# Patient Record
Sex: Female | Born: 1935
Health system: Southern US, Community
[De-identification: ages and names within clinical notes are randomized; demographics above are authoritative.]

## PROBLEM LIST (undated history)

## (undated) DIAGNOSIS — F32A Depression, unspecified: Secondary | ICD-10-CM

## (undated) DIAGNOSIS — I10 Essential (primary) hypertension: Secondary | ICD-10-CM

## (undated) DIAGNOSIS — K219 Gastro-esophageal reflux disease without esophagitis: Secondary | ICD-10-CM

## (undated) DIAGNOSIS — T8859XA Other complications of anesthesia, initial encounter: Secondary | ICD-10-CM

## (undated) DIAGNOSIS — M1990 Unspecified osteoarthritis, unspecified site: Secondary | ICD-10-CM

## (undated) DIAGNOSIS — N189 Chronic kidney disease, unspecified: Secondary | ICD-10-CM

## (undated) DIAGNOSIS — C189 Malignant neoplasm of colon, unspecified: Secondary | ICD-10-CM

## (undated) DIAGNOSIS — Z5189 Encounter for other specified aftercare: Secondary | ICD-10-CM

## (undated) DIAGNOSIS — E785 Hyperlipidemia, unspecified: Secondary | ICD-10-CM

## (undated) DIAGNOSIS — R011 Cardiac murmur, unspecified: Secondary | ICD-10-CM

## (undated) DIAGNOSIS — F419 Anxiety disorder, unspecified: Secondary | ICD-10-CM

## (undated) DIAGNOSIS — T4145XA Adverse effect of unspecified anesthetic, initial encounter: Secondary | ICD-10-CM

## (undated) DIAGNOSIS — Z923 Personal history of irradiation: Secondary | ICD-10-CM

## (undated) DIAGNOSIS — C801 Malignant (primary) neoplasm, unspecified: Secondary | ICD-10-CM

## (undated) DIAGNOSIS — F329 Major depressive disorder, single episode, unspecified: Secondary | ICD-10-CM

## (undated) DIAGNOSIS — I2510 Atherosclerotic heart disease of native coronary artery without angina pectoris: Secondary | ICD-10-CM

## (undated) HISTORY — DX: Gastro-esophageal reflux disease without esophagitis: K21.9

## (undated) HISTORY — PX: BREAST LUMPECTOMY: SHX2

## (undated) HISTORY — DX: Encounter for other specified aftercare: Z51.89

## (undated) HISTORY — PX: EYE SURGERY: SHX253

## (undated) HISTORY — PX: OTHER SURGICAL HISTORY: SHX169

## (undated) HISTORY — PX: ABDOMINAL HYSTERECTOMY: SHX81

## (undated) HISTORY — DX: Other complications of anesthesia, initial encounter: T88.59XA

## (undated) HISTORY — PX: DIAGNOSTIC LAPAROSCOPY: SUR761

## (undated) HISTORY — DX: Malignant (primary) neoplasm, unspecified: C80.1

## (undated) HISTORY — DX: Chronic kidney disease, unspecified: N18.9

## (undated) HISTORY — DX: Essential (primary) hypertension: I10

## (undated) HISTORY — DX: Cardiac murmur, unspecified: R01.1

## (undated) HISTORY — PX: APPENDECTOMY: SHX54

## (undated) HISTORY — DX: Hyperlipidemia, unspecified: E78.5

## (undated) HISTORY — DX: Adverse effect of unspecified anesthetic, initial encounter: T41.45XA

---

## 1959-07-11 DIAGNOSIS — Z5189 Encounter for other specified aftercare: Secondary | ICD-10-CM

## 1959-07-11 HISTORY — DX: Encounter for other specified aftercare: Z51.89

## 1959-07-11 HISTORY — PX: OTHER SURGICAL HISTORY: SHX169

## 1998-07-02 ENCOUNTER — Other Ambulatory Visit: Admission: RE | Admit: 1998-07-02 | Discharge: 1998-07-02 | Payer: Self-pay | Admitting: Gynecology

## 1999-11-10 HISTORY — PX: BREAST SURGERY: SHX581

## 2000-03-12 ENCOUNTER — Encounter: Payer: Self-pay | Admitting: Geriatric Medicine

## 2000-03-12 ENCOUNTER — Encounter: Admission: RE | Admit: 2000-03-12 | Discharge: 2000-03-12 | Payer: Self-pay | Admitting: Geriatric Medicine

## 2000-07-16 ENCOUNTER — Encounter: Payer: Self-pay | Admitting: Geriatric Medicine

## 2000-07-16 ENCOUNTER — Encounter: Admission: RE | Admit: 2000-07-16 | Discharge: 2000-07-16 | Payer: Self-pay | Admitting: Geriatric Medicine

## 2001-07-01 ENCOUNTER — Encounter: Admission: RE | Admit: 2001-07-01 | Discharge: 2001-07-01 | Payer: Self-pay | Admitting: Geriatric Medicine

## 2001-07-01 ENCOUNTER — Encounter: Payer: Self-pay | Admitting: Geriatric Medicine

## 2001-07-08 ENCOUNTER — Ambulatory Visit (HOSPITAL_COMMUNITY): Admission: RE | Admit: 2001-07-08 | Discharge: 2001-07-08 | Payer: Self-pay | Admitting: Geriatric Medicine

## 2001-09-29 ENCOUNTER — Ambulatory Visit (HOSPITAL_COMMUNITY): Admission: RE | Admit: 2001-09-29 | Discharge: 2001-09-29 | Payer: Self-pay | Admitting: Gastroenterology

## 2001-10-27 ENCOUNTER — Encounter: Admission: RE | Admit: 2001-10-27 | Discharge: 2001-10-27 | Payer: Self-pay | Admitting: *Deleted

## 2001-10-27 ENCOUNTER — Encounter: Payer: Self-pay | Admitting: *Deleted

## 2001-10-28 ENCOUNTER — Encounter (INDEPENDENT_AMBULATORY_CARE_PROVIDER_SITE_OTHER): Payer: Self-pay | Admitting: *Deleted

## 2001-10-28 ENCOUNTER — Encounter: Payer: Self-pay | Admitting: *Deleted

## 2001-10-28 ENCOUNTER — Ambulatory Visit (HOSPITAL_BASED_OUTPATIENT_CLINIC_OR_DEPARTMENT_OTHER): Admission: RE | Admit: 2001-10-28 | Discharge: 2001-10-28 | Payer: Self-pay | Admitting: *Deleted

## 2001-11-07 ENCOUNTER — Ambulatory Visit: Admission: RE | Admit: 2001-11-07 | Discharge: 2002-02-05 | Payer: Self-pay | Admitting: Radiation Oncology

## 2001-11-09 HISTORY — PX: CORONARY ARTERY BYPASS GRAFT: SHX141

## 2001-11-11 ENCOUNTER — Encounter (INDEPENDENT_AMBULATORY_CARE_PROVIDER_SITE_OTHER): Payer: Self-pay | Admitting: Specialist

## 2001-11-11 ENCOUNTER — Ambulatory Visit (HOSPITAL_BASED_OUTPATIENT_CLINIC_OR_DEPARTMENT_OTHER): Admission: RE | Admit: 2001-11-11 | Discharge: 2001-11-11 | Payer: Self-pay | Admitting: *Deleted

## 2001-11-18 ENCOUNTER — Encounter: Payer: Self-pay | Admitting: Geriatric Medicine

## 2001-11-18 ENCOUNTER — Encounter: Admission: RE | Admit: 2001-11-18 | Discharge: 2001-11-18 | Payer: Self-pay | Admitting: Geriatric Medicine

## 2002-03-03 ENCOUNTER — Other Ambulatory Visit: Admission: RE | Admit: 2002-03-03 | Discharge: 2002-03-03 | Payer: Self-pay | Admitting: *Deleted

## 2002-05-01 ENCOUNTER — Other Ambulatory Visit: Admission: RE | Admit: 2002-05-01 | Discharge: 2002-05-01 | Payer: Self-pay | Admitting: Obstetrics and Gynecology

## 2002-05-03 ENCOUNTER — Encounter: Payer: Self-pay | Admitting: Geriatric Medicine

## 2002-05-03 ENCOUNTER — Encounter: Admission: RE | Admit: 2002-05-03 | Discharge: 2002-05-03 | Payer: Self-pay | Admitting: Geriatric Medicine

## 2002-05-10 ENCOUNTER — Encounter: Payer: Self-pay | Admitting: Obstetrics and Gynecology

## 2002-05-10 ENCOUNTER — Encounter: Admission: RE | Admit: 2002-05-10 | Discharge: 2002-05-10 | Payer: Self-pay | Admitting: Obstetrics and Gynecology

## 2002-06-12 ENCOUNTER — Ambulatory Visit (HOSPITAL_COMMUNITY): Admission: RE | Admit: 2002-06-12 | Discharge: 2002-06-12 | Payer: Self-pay | Admitting: Radiation Oncology

## 2002-06-21 ENCOUNTER — Ambulatory Visit (HOSPITAL_COMMUNITY): Admission: RE | Admit: 2002-06-21 | Discharge: 2002-06-21 | Payer: Self-pay | Admitting: Geriatric Medicine

## 2002-09-28 ENCOUNTER — Ambulatory Visit (HOSPITAL_COMMUNITY): Admission: RE | Admit: 2002-09-28 | Discharge: 2002-09-28 | Payer: Self-pay | Admitting: Interventional Cardiology

## 2002-10-12 ENCOUNTER — Encounter: Payer: Self-pay | Admitting: Surgery

## 2002-10-16 ENCOUNTER — Encounter: Payer: Self-pay | Admitting: Surgery

## 2002-10-16 ENCOUNTER — Inpatient Hospital Stay (HOSPITAL_COMMUNITY): Admission: RE | Admit: 2002-10-16 | Discharge: 2002-10-21 | Payer: Self-pay | Admitting: Surgery

## 2002-10-16 ENCOUNTER — Encounter (INDEPENDENT_AMBULATORY_CARE_PROVIDER_SITE_OTHER): Payer: Self-pay | Admitting: Specialist

## 2002-10-17 ENCOUNTER — Encounter: Payer: Self-pay | Admitting: Surgery

## 2002-10-18 ENCOUNTER — Encounter: Payer: Self-pay | Admitting: Surgery

## 2002-10-19 ENCOUNTER — Encounter: Payer: Self-pay | Admitting: Thoracic Surgery (Cardiothoracic Vascular Surgery)

## 2002-11-14 ENCOUNTER — Ambulatory Visit: Admission: RE | Admit: 2002-11-14 | Discharge: 2002-11-14 | Payer: Self-pay | Admitting: Radiation Oncology

## 2002-11-21 ENCOUNTER — Ambulatory Visit: Admission: RE | Admit: 2002-11-21 | Discharge: 2002-11-21 | Payer: Self-pay | Admitting: Radiation Oncology

## 2003-01-15 ENCOUNTER — Encounter (HOSPITAL_COMMUNITY): Admission: RE | Admit: 2003-01-15 | Discharge: 2003-04-15 | Payer: Self-pay | Admitting: Interventional Cardiology

## 2003-05-03 ENCOUNTER — Ambulatory Visit (HOSPITAL_COMMUNITY): Admission: RE | Admit: 2003-05-03 | Discharge: 2003-05-03 | Payer: Self-pay | Admitting: Geriatric Medicine

## 2003-05-03 ENCOUNTER — Encounter: Payer: Self-pay | Admitting: Geriatric Medicine

## 2003-05-04 ENCOUNTER — Ambulatory Visit (HOSPITAL_COMMUNITY): Admission: RE | Admit: 2003-05-04 | Discharge: 2003-05-04 | Payer: Self-pay | Admitting: Geriatric Medicine

## 2003-05-04 ENCOUNTER — Encounter: Payer: Self-pay | Admitting: Geriatric Medicine

## 2004-05-07 ENCOUNTER — Encounter: Admission: RE | Admit: 2004-05-07 | Discharge: 2004-05-07 | Payer: Self-pay | Admitting: General Surgery

## 2006-04-13 ENCOUNTER — Encounter: Admission: RE | Admit: 2006-04-13 | Discharge: 2006-04-13 | Payer: Self-pay | Admitting: Geriatric Medicine

## 2008-06-08 ENCOUNTER — Encounter: Admission: RE | Admit: 2008-06-08 | Discharge: 2008-06-08 | Payer: Self-pay | Admitting: Geriatric Medicine

## 2009-02-13 ENCOUNTER — Other Ambulatory Visit: Admission: RE | Admit: 2009-02-13 | Discharge: 2009-02-13 | Payer: Self-pay | Admitting: Obstetrics and Gynecology

## 2011-03-27 NOTE — Consult Note (Signed)
NAME:  Marie Burch, Marie Burch                           ACCOUNT NO.:  [id number]   MEDICAL RECORD NO.:  [id number]                   PATIENT TYPE:  INP   LOCATION:  2301                                 FACILITY:  [facility]   PHYSICIAN:  Quita Skye. Krista Blue, M.D.               DATE OF BIRTH:  1934-08-21   DATE OF CONSULTATION:  DATE OF DISCHARGE:                                   CONSULTATION   SURGEON:  Quita Skye. Krista Blue, M.D.   DIAGNOSIS:  Mitral regurgitation and coronary artery disease.   HISTORY OF PRESENT ILLNESS:  The patient is a 77 year-old white female who  presents to the operating room for coronary artery bypass grafting and  mitral valve replacement versus repair.  A transesophageal echocardiogram  was requested for the intraoperative management of this patient.  The  patient underwent a routine cardiac induction.  The transesophageal probe  was lubricated and carefully inserted through a mouth guard.  Cardiac images  were obtained and overall images of the heart appeared normal without  pericardial effusion. The right atrium was normal in size without mass or  thrombus.  The inner-atrial septum appeared to be intact without evidence of  defect. The tricuspid valve had trace regurgitation but overall structure  appeared to be normal.  The right ventricle was generous but had good  contractility overall.  The left atrium had moderate enlargement.  The  mitral valve was clearly abnormal with the posterior leaflet demonstrating a  prolapsing flail dissection with a regurgitant jet tracking along the septal  wall.  The regurgitation was estimated at 4+.  There was some thickening of  the valve as well. The left ventricle had good overall contractility and  appeared to be normal in size.  The aortic valve had three leaflets.  There  was no evidence of regurgitation or stenosis.  Following coronary artery  bypass grafting the mitral valve was repaired and a valve ring was inserted  into the  patient.  The valve was then assessed before coming off bypass and  appeared to have no more prolapse of the valve with good coaptation, so the  patient was separated then from bypass.  Images post bypass showed the valve  to be functioning with trace regurgitation and no prolapse at all. The valve  ring appeared to be seated in place with no evidence of perivalvular leak.  Overall, this was significant improvement. The left ventricle appeared to  function without any evidence of regional wall motion abnormalities and the  patient continued to do well. The probe was carefully removed and the  patient was taken to the SICU in good condition.  Quita Skye Krista Blue, M.D.    JDS/MEDQ  D:  10/16/2002  T:  10/16/2002  Job:  [id number]

## 2011-03-27 NOTE — Op Note (Signed)
NAME:  Marie Burch, Marie Burch                           ACCOUNT NO.:  [id number]   MEDICAL RECORD NO.:  [id number]                   PATIENT TYPE:  INP   LOCATION:  2301                                 FACILITY:  [facility]   PHYSICIAN:  Evelene Croon, M.D.                  DATE OF BIRTH:  1934/10/13   DATE OF PROCEDURE:  10/16/2002  DATE OF DISCHARGE:                                 OPERATIVE REPORT   PREOPERATIVE DIAGNOSES:  Severe mitral regurgitation, two-vessel coronary  artery disease.   POSTOPERATIVE DIAGNOSES:  Severe mitral regurgitation, two-vessel coronary  artery disease.   OPERATIVE PROCEDURE:  Median sternotomy, extracorporal circulation, coronary  artery bypass graft surgery times two, using saphenous vein grafts to the  diagonal branch of the left anterior descending and the obtuse marginal  branch of the left circumflex coronary artery; mitral valve repair with  quadrangular resection of flail segment of the posterior leaflet, with  insertion of a 28 millimeter St. Jude Sequin annuloplasty ring; oversewing  of the left atrial appendage; and, endoscopic vein harvesting from the right  thigh.   SURGEON:  Dr. Evelene Croon.   ASSISTANT:  Dr. Kathlee Nations Trigt.   SECOND ASSISTANT:  Adair Patter, PA-C.   ANESTHESIA:  General endotracheal.   CLINICAL HISTORY:  This patient is a 77 year old white female, with history  of mitral valve disease, that is longstanding.  She has had a mitral  regurgitation murmur noted since at least 2001.  She has been followed by  echocardiograms since.  She was diagnosed with breast cancer and underwent  lumpectomy and radiation therapy that finished in March 2003, and then  beginning about April 2003, she began having shortness of breath.  An  echocardiogram at that time was apparently stable.  She saw multiple doctors  and was given different diagnoses, such as radiation pneumonitis and asthma,  and was eventually referred to Cardiology, where she  was found to be in  congestive heart failure.  She had a dramatic improvement with diuretics.  An echocardiogram on August 25, 2002, showed severe mitral regurgitation.  There was no significant tricuspid trauma, aorta trauma or pulmonic  insufficiency.  The left atrium was remarkably increased in size and the  left ventricle appeared slightly larger.  The left ventricular ejection  fraction was about 70%.  Right and left heart catheterization was performed  on August 28, 2002.  This showed a pulmonary artery pressure of 61/29, with  a wedge pressure of 26 and a Victor wave of 45.  The left ventricle was  mildly dilated with an ejection fraction of 55-60%.  There was four plus  mitral regurgitation in the pulmonary veins.  Coronary angiography showed a  75% first diagonal ostial stenosis and a 70-80% obtuse marginal stenosis.  After review of the patient and her studies, it was felt that mitral valve  repair or replacement, and coronary  artery bypass graft surgery was probably  the best treatment.  I discussed the operative procedure of mitral valve  repair and possibly replacement, and coronary artery bypass graft surgery  with the patient and her family, including alternatives, benefits, and  risks, including bleeding, blood transfusion, infection, stroke, myocardial  infarction and death.  They understood and agreed to proceed.  We discussed  the options for prosthetic valve replacement if needed, including mechanical  and tissue valves, and she decided that a mechanical valve would be best for  her.   OPERATIVE PROCEDURE:  The patient was taken to the table in the supine  position.  After induction of general endotracheal anesthesia a Foley  catheter was placed in the bladder using sterile technique.  Then, the  chest, abdomen and both lower extremities were prepped and draped in the  usual sterile manner.  Preoperative intravenous vancomycin and Tequin were  given.   A  transesophageal echocardiogram was performed by Anesthesiology.  This  showed severe mitral regurgitation.  There appeared to be a flail segment on  the posterior leaflet, with a ruptured chordae.  The anterior leaflet  appeared structurally in good condition.  There was marked left atrial  dilatation.  Left ventricular function was mildly depressed, with marked  left ventricular dilatation.  Pulmonary pressure was 80/40 before the  patient was put to sleep and then decreased to about 60/35.   Then, the chest was entered through a median sternotomy incision and the  pericardium opened in the  midline.  Examination of the heart showed some  dilatation of the right and left ventricles.  The ascending aorta had no  palpable plaques in it.   Then, the patient was heparinized and when an adequate act was achieved the  distal ascending aorta was cannulated using a 20 French aortic cannula for  arterial inflow.  Venous outflow was achieved using bicaval venous  cannulation with a 28 French metal-tipped, right-angled cannula placed with  a pursestring suture in the superior vena cava and a 36 [place] plastic,  right-angled cannula placed with a pursestring suture in the lower atrium.  An antegrade cardioplegia and vancomycin cannula was inserted in the aortic  root.  A retrograde cardioplegia cannula was inserted in the coronary sinus.  Unfortunately, this resulted in perforation of the midportion of the  coronary sinus.  The tissues were very weak.   At the same time a segment of the greater saphenous vein was harvested from  the right thigh using endoscopic vein harvest technique.  This vein was of  medium size and good quality.   Then, the patient was placed on cardiopulmonary bypass and the distal  coronary was identified.  The diagonal and obtuse marginal were both medium-  sized graftable vessels.  There was no other significant coronary disease  noted.  The aorta was then cross-clamped  and 500 cubic centimeters of cold blood  antegrade cardioplegia was administered in the aortic root, with quick  arrest of the heart.  Systemic hypothermia to 20 degrees Centigrade and  topical hypothermic iced saline was used.  A temperature probe was placed in  the septum and insulating pad in the pericardium.   The, the coronary sinus was repaired using continuous 7-0 Prolene suture.  The coronary sinus catheter was then removed.  Then, the first distal  anastomosis was performed to the obtuse marginal vessel.  The internal  diameter of this vessel was about 1.6 millimeters.  The conduit we used with  a segment of greater saphenous vein and anastomosis performed in an end-to-  side manner using continuous 7-0 Prolene  suture.  Flow was administered  through the graft and was excellent.   The second distal anastomosis was performed to the diagonal branch.  The  internal diameter of this vessel was about 1.6 millimeters.  The conduit we  used was a second saphenous vein and anastomosis was performed in an end-to-  side manner using continuous 7-0 Prolene suture.  Flow was administered  through the graft and was excellent.  Then, another dose of cardioplegia was  given down the vein grafts and the aortic root.   Then, attention was turned to the mitral valve repair.  The left atrium was  opened through a vertical incision in the intra-atrial groove.  A retractor  was placed.  Examination of the valve showed that there were several primary  chordae to the P2 section of the posterior leaflet that were ruptured.  The  remainder of the posterior leaflet appeared intact.  The anterior leaflet is  mildly thickened, but structurally intact.  There is no prolapse.  There was  some annular calcification.  The subvalvular apparatus, otherwise, appeared  unremarkable.  I thought this would be a repairable valve.  Then, the P2  section of the posterior leaflet was resected in a quadrangular  fashion.  Then, part of the posterior leaflet was divided from the annulus and a  sliding leaflet plasty was performed using continuous 4-0 Ethibond suture.  Then, the area of the leaflet resection was buttressed with two annular  sutures of 2-0 Ethibond.  The posterior leaflet was then reapproximated  using interrupted 5-0 Prolene sutures.  The leaflet was flushed with saline  and it appeared to be a competent valve.  Then, the annulus was sized and a  28 millimeter St. Jude Seguin annuloplasty ring was chosen.  A series of 2-0  Ethibond, horizontal and mattress sutures were placed around the annulus.  It was placed through the sewing ring and the ring lowered into place.  The  sutures were tied sequentially.  The annuloplasty ring seated nicely.  The  valve was then tested with ice saline and was completely competent.  There  was good coarctation of the leaflets.  Then, the patient was rewarmed to 37 degrees Centigrade.  The left atrium was closed in two layers using  continuous 4-0 Prolene suture.  Then, the patient was placed with the head  in the Trendelenburg position.  After deairing maneuvers the cross-clamp was  removed, with time of 152 minutes.  There was spontaneous return of sinus  rhythm.   Partial occlusion clamps were placed in the aortic root and the two proximal  vein graft anastomoses were performed in an end-to-side manner using  continuous 6-0 Prolene suture.  The clamp was removed, the vein graft  deaired and clamps removed from them.  The proximal and distal anastomosis  appeared hemostatic and all the grafts are satisfactory.  Graft markers were  placed on the proximal anastomoses.  Two temporary right ventricular and  right atrial pacing wires placed and brought through the skin.   When the patient had rewarmed to 37 degrees Centigrade, she was weaned from  cardiopulmonary bypass on low dose dopamine.  Total bypass time was 192  minutes.  Cardiac function  appeared excellent with cardiac output of 5  liters per minute.  Transesophageal echocardiogram was performed and the  mitral valve showed no leakage.  There was  a marked decrease in  postoperative pulmonary artery pressure.  Left ventricular function appeared  improved.  Then, the patient was given protamine and the venous and aortic  cannulas were removed without difficulty.  The patient was given platelets  due to thrombocytopenia with a platelet count of 72,000.  Hemostasis was  achieved.  Three chest tubes were placed, in the posterior pericardium, one  in the anterior mediastinum and one in the right pleural space which had  been opened during sternotomy.  The pericardium was then reapproximated over  the heart.  The sternum was closed with number 16 sternal wire.  The fascia  was closed with continuous #1 Vicryl running suture.  The subcutaneous  tissue was closed with continuous 2-0 Vicryl and the skin with 3-0 Vicryl  subcuticular closure.  The lower extremity vein harvest site was closed in  running sutures in a similar manner.  The sponge, needle and instrument  counts were correct according to the scrub nurse.  Dry sterile dressings  were applied over the incisions, around the chest tubes,  with Pleur-evac  suction.  The patient remained hemodynamically stable and was transported to  the Surgical Intensive Care Unit in guarded, but stable condition.                                               Evelene Croon, M.D.    BB/MEDQ  D:  10/16/2002  T:  10/16/2002  Job:  [id number]   cc:   Lyn Records III, M.D.  [address]  [address]  [place]  [place] [address]  Fax: [phone]   Cardiac Cath Lab

## 2011-03-27 NOTE — Op Note (Signed)
[place]  Patient:    Marie Burch, Marie Burch Visit Number: [id number] MRN: [id number]          Service Type: DSU Location: [facility] Attending Physician:  Kandis Mannan Dictated by:   Donnie Coffin Samuella Cota, M.D. Proc. Date: 10/28/01 Admit Date:  10/28/2001   CC:         Hal T. Stoneking, M.D.   Operative Report  CCS NUMBER:  [id number]  PREOPERATIVE DIAGNOSIS:  Abnormal mammogram, right breast.  POSTOPERATIVE DIAGNOSIS:  Abnormal mammogram, right breast.  OPERATION:  Needle localization and biopsy of right breast.  SURGEON:  Maisie Fus B. Samuella Cota, M.D.  ANESTHESIA:  One percent Xylocaine local with anesthesia monitoring.  ANESTHESIOLOGIST:  Janetta Hora. Gelene Mink, M.D. and CRNA.  DESCRIPTION OF PROCEDURE:  The patient was taken to the operating room and placed on the table in the supine position.  The patient had a wire coming through the skin of the right breast in the upper outer quadrant which was projecting down toward her umbilicus.  The right breast was prepped and draped in a sterile field.  An elliptical incision was outlined with the skin marker. One percent Xylocaine local was used to the infiltrate the skin and underlying breast tissue.  The elliptical incision was made so as to remove the opening in the skin made by the wire.  Using the wire as a guide, a rather generous core biopsy around the wire was taken out.  No abnormality of the breast tissue was noted grossly.  The specimen was marked medially with the short suture, and laterally and inferiorly with the long suture.  The specimen was given to the radiologist who x-rayed the specimen and said the area of concern had been removed.  Bleeding was controlled with the cautery.  The subcutaneous tissue closed with interrupted sutures of 3-0 Vicryl and the skin was closed with running subcuticular suture of 4-0 Vicryl.  Benzoin and 1/2 inch Steri-Strips were used to reinforce the skin closure.  The  patient had a small flesh-colored mole just inferior to this and this was excised.  The base was cauterized.  A dry sterile dressing using 4 x 4s, ABD, and 4 inch Hypafix was applied.  The patient seemed to tolerate the procedure well and was taken to the PACU in satisfactory condition. Dictated by:   Donnie Coffin Samuella Cota, M.D. Attending Physician:  Kandis Mannan DD:  10/28/01 TD:  10/29/01 Job: [id number]

## 2011-03-27 NOTE — Discharge Summary (Signed)
   NAME:  Marie Burch, Marie Burch                           ACCOUNT NO.:  [id number]   MEDICAL RECORD NO.:  [id number]                   PATIENT TYPE:  INP   LOCATION:  2011                                 FACILITY:  [facility]   PHYSICIAN:  Evelene Croon, M.D.                  DATE OF BIRTH:  January 14, 1934   DATE OF ADMISSION:  10/16/2002  DATE OF DISCHARGE:  10/21/2002                                 DISCHARGE SUMMARY   ADMISSION DIAGNOSES:  1. Coronary artery disease.  2. Mitral regurgitation.   HOSPITAL COURSE:  The patient was admitted by this hospital on October 16, 2002, after being seen and evaluated by Dr. Laneta Simmers as an outpatient for  coronary artery disease and mitral regurgitation.  On the day of admission,  the patient underwent a coronary bypass graft x2 with a saphenous vein graft  to the diagonal and a saphenous vein graft to the obtuse marginal artery.  She also underwent a mitral valve repair with a  #28 size ___________.  No  complications were noted during the procedure. Postoperatively, patient had  Coumadin started secondary to her mitral valve repair. She had some  thrombocytopenia postoperatively. This was alleviated by discontinuing all  heparin flushes.  Her hemoglobin, hematocrit stabilized at 8.9, 26.3, and  her platelets had risen to 120,000 by time of discharge.  She was  subsequently deemed stable for discharge home on October 21, 2002 after a  relatively unremarkable hospital course.   DISCHARGE MEDICATIONS:  1. Aspirin 325 mg 1 daily.  2. Protonix 40 mg twice daily.  3. Lasix 40 mg one daily for 7 days.  4. Potassium chloride 20 mEq 1 daily for 7 days.  5. Toprol XL 25 mg one daily.  6. Ultram 50 mg, ______ tablet every 4 to 6 hours as needed for relieve     pain.  7. Coumadin 5 mg one daily or as directed by her cardiologist, Dr. Katrinka Blazing.   DISCHARGE ACTIVITIES:  She was told no driving or strenuous activity.   DISCHARGE DIET:  Low fat, low salt.   WOUND CARE:   Patient told could shower and clean incision with soap and  water.   FOLLOW UP:  Patient told to see her cardiologist Dr. Katrinka Blazing in two weeks.  She was also told to see Dr. Laneta Simmers on Tuesday, January 6 at 9:30 A.M.     Levin Erp. Steward, P.A.                      Evelene Croon, M.D.    BGS/MEDQ  D:  10/20/2002  T:  10/22/2002  Job:  [id number]

## 2011-03-27 NOTE — Cardiovascular Report (Signed)
NAME:  Marie Burch, Marie Burch                           ACCOUNT NO.:  [id number]   MEDICAL RECORD NO.:  [id number]                   PATIENT TYPE:  OIB   LOCATION:  2899                                 FACILITY:  [facility]   PHYSICIAN:  Lesleigh Noe, M.D.            DATE OF BIRTH:  August 10, 1934   DATE OF PROCEDURE:  09/28/2002  DATE OF DISCHARGE:                              CARDIAC CATHETERIZATION   INDICATIONS FOR PROCEDURE:  Recent heart failure, progressive and severe  mitral regurgitation.   PROCEDURE:  1. Left heart catheterization.  2. Right heart catheterization.  3. Coronary angiography.  4. Left ventriculography.   DESCRIPTION OF PROCEDURE:  After informed consent, and following 1% local  Xylocaine infiltration, a #8 French sheath was placed in the right femoral  vein and a #6 French sheath in the right femoral artery, both using the  modified Seldinger technique.  The #7 [place] Swan-Ganz catheter was used for  right heart hemodynamic recordings and thermodilution cardiac output  determination.  An oximetry sample was also obtained from the right  pulmonary artery.  Left heart catheterization was performed initially with  an angled #6 French pigtail catheter. Oximetry samples and hemodynamic data  was obtained with this catheter.  Left ventriculography was data was  obtained with this catheter.  Left ventriculography was performed with this  catheter using 40 cc of contrast at 12 cc per second for the total dye load.  Left heart catheterization for coronary angiography was then performed with  a #6 [place] A2 multipurpose catheter for the right coronary and a #4/6  [place] Judkins' catheter for the left coronary angiogram.  Transient left  bundle branch block developed during the right heart and left heart  catheterization and pressure recordings.  The bundle persisted throughout  the procedure.  Completing the procedure the patient was taken to the  holding area where sheaths  were removed.  No complications other than the  development of the  bundle branch block were noted.   RESULTS:  1. Hemodynamic data:     a. Right atrial pressure, mean 6 mmHg.     b. Right ventricular pressure 61/9.     c. Pulmonary artery pressure 61/29.     d. Pulmonary capillary wedge pressure mean 26.  V wave to 45 mmHg.     e. Aortic pressure 112/66.     f. Left ventricular pressure 112/14.     g. Cardiac output by thick 2.0 liters per minute.  Thermodilution output        2.9 liters per minute.     h. In the pulmonary artery tracings and also the right ventricular        hemodynamic recordings pulsus alternans was noted.  This likely        represents impending right heart failure.  There is also the        possibility that this could in  some way represent a hemodynamic effect        of the newly developed left bundle branch block that occurred during        catheter manipulation, although I doubt that possibility.  2. Left ventriculography:  The left ventricle was mildly dilated.  Overall     contractility is normal and estimated to be in the 55 to 60% range.     There is some mild dyssynergy due to conduction abnormality that     developed during catheter manipulation.  There is 4+ mitral regurgitation     with reflux of contrast noted into the pulmonary veins.  The left atrium     is dilated.  3. Coronary angiography:     a. Left main coronary:  Normal.     b. Left anterior descending coronary:  Irregularities noted in the mid        vessel.  The large first diagonal contains an ostial 75% stenosis.  No        significant obstruction is noted in the left anterior descending        itself.  Left anterior descending wraps around the apex.     c. Circumflex artery:  Circumflex artery gives origin to one large        trifurcating obtuse marginal.  The proximal portion of this vessel        contains an eccentric 70 to 80% stenosis.     d. Right coronary:  The right coronary is  dominant.  It gives origin to a        large posterior descending artery.  No significant abnormalities are        noted.   CONCLUSIONS:  1. Severe (4+) mitral regurgitation secondary to mitral valve prolapse.  By     cinefluoroscopy the mitral annulus contains moderate to heavy     calcification.  2. Pulmonary hypertension with pulmonary arterial and right ventricular     pulsus alternans suggesting right ventricular dysfunction.  3. Coronary artery disease with 70% first obtuse marginal and 80% first     diagonal.  4. Low normal left ventricular dysfunction for degree of mitral     regurgitation.   RECOMMENDATIONS:  Mitral valve repair/replacement.  Coronary artery bypass  grafting.  The patient is at increased risk for right heart failure on  bypass given the hemodynamic detection of right ventricular/pulmonary artery  pulsus alternans.                                               Lesleigh Noe, M.D.    HWS/MEDQ  D:  09/28/2002  T:  09/28/2002  Job:  [id number]   cc:   Evelene Croon, M.D.  [address]  [place]  [place] [address]  Fax: [phone]   Hal T. Stoneking, M.D.  [address] [place], [place] [address]  Fax: [phone]

## 2011-07-09 ENCOUNTER — Other Ambulatory Visit: Payer: Self-pay | Admitting: Obstetrics and Gynecology

## 2011-07-14 ENCOUNTER — Other Ambulatory Visit: Payer: Self-pay | Admitting: Geriatric Medicine

## 2011-07-14 DIAGNOSIS — R52 Pain, unspecified: Secondary | ICD-10-CM

## 2011-07-20 ENCOUNTER — Other Ambulatory Visit: Payer: Self-pay

## 2011-07-28 ENCOUNTER — Ambulatory Visit
Admission: RE | Admit: 2011-07-28 | Discharge: 2011-07-28 | Disposition: A | Discharge status: Home / Self Care | Payer: Medicare Other | Source: Ambulatory Visit | Attending: Geriatric Medicine | Admitting: Geriatric Medicine

## 2011-07-28 DIAGNOSIS — R52 Pain, unspecified: Secondary | ICD-10-CM

## 2012-02-24 ENCOUNTER — Other Ambulatory Visit: Payer: Self-pay | Admitting: Geriatric Medicine

## 2012-02-24 DIAGNOSIS — H3400 Transient retinal artery occlusion, unspecified eye: Secondary | ICD-10-CM

## 2012-02-26 ENCOUNTER — Ambulatory Visit
Admission: RE | Admit: 2012-02-26 | Discharge: 2012-02-26 | Disposition: A | Discharge status: Home / Self Care | Payer: Medicare Other | Source: Ambulatory Visit | Attending: Geriatric Medicine | Admitting: Geriatric Medicine

## 2012-02-26 DIAGNOSIS — H3400 Transient retinal artery occlusion, unspecified eye: Secondary | ICD-10-CM

## 2012-05-31 ENCOUNTER — Other Ambulatory Visit (HOSPITAL_COMMUNITY): Payer: Self-pay | Admitting: Gastroenterology

## 2012-05-31 ENCOUNTER — Ambulatory Visit (HOSPITAL_COMMUNITY)
Admission: RE | Admit: 2012-05-31 | Discharge: 2012-05-31 | Disposition: A | Discharge status: Home / Self Care | Payer: Medicare Other | Source: Ambulatory Visit | Attending: Gastroenterology | Admitting: Gastroenterology

## 2012-05-31 DIAGNOSIS — K6389 Other specified diseases of intestine: Secondary | ICD-10-CM

## 2012-05-31 DIAGNOSIS — K625 Hemorrhage of anus and rectum: Secondary | ICD-10-CM | POA: Insufficient documentation

## 2012-05-31 DIAGNOSIS — Q438 Other specified congenital malformations of intestine: Secondary | ICD-10-CM | POA: Insufficient documentation

## 2012-05-31 DIAGNOSIS — K5730 Diverticulosis of large intestine without perforation or abscess without bleeding: Secondary | ICD-10-CM | POA: Insufficient documentation

## 2012-06-03 ENCOUNTER — Other Ambulatory Visit: Payer: Self-pay | Admitting: Gastroenterology

## 2012-06-03 DIAGNOSIS — K921 Melena: Secondary | ICD-10-CM

## 2012-06-13 ENCOUNTER — Ambulatory Visit
Admission: RE | Admit: 2012-06-13 | Discharge: 2012-06-13 | Disposition: A | Discharge status: Home / Self Care | Payer: Medicare Other | Source: Ambulatory Visit | Attending: Gastroenterology | Admitting: Gastroenterology

## 2012-06-13 DIAGNOSIS — K921 Melena: Secondary | ICD-10-CM

## 2012-06-17 ENCOUNTER — Encounter (INDEPENDENT_AMBULATORY_CARE_PROVIDER_SITE_OTHER): Payer: Self-pay

## 2012-06-17 ENCOUNTER — Encounter (INDEPENDENT_AMBULATORY_CARE_PROVIDER_SITE_OTHER): Payer: Self-pay | Admitting: General Surgery

## 2012-06-17 ENCOUNTER — Ambulatory Visit (INDEPENDENT_AMBULATORY_CARE_PROVIDER_SITE_OTHER): Payer: Medicare Other | Admitting: General Surgery

## 2012-06-17 VITALS — BP 136/96 | HR 59 | Temp 96.4°F | Ht 60.0 in | Wt 127.2 lb

## 2012-06-17 DIAGNOSIS — K6389 Other specified diseases of intestine: Secondary | ICD-10-CM

## 2012-06-17 NOTE — Progress Notes (Signed)
Patient ID: Marie Burch, female   DOB: Sep 02, 1934, 78 y.o.   MRN: [id number]  Chief Complaint  Patient presents with  . Pre-op Exam    eval colon    HPI Marie Burch is a 78 y.o. female.   HPI This patient is referred by Dr. Laural Benes and for evaluation of a left colon mass found on colonoscopy. About 2 months ago she had one episode of rectal bleeding which was a fairly significant amount and she describes this as a dark blood and hasn't had any further episodes since then. She says that she is normally constipated and takes MiraLax frequently to relieve this. Otherwise she has no daily history of colon cancer and has been otherwise asymptomatic except for some occasional lower abdominal discomfort. She does have a history of diverticulosis but has not had any apparent episodes of diverticulitis or antibiotic treatment for diverticulitis. She did have a colonoscopy about 9 years ago and as far she knows this was normal. She was referred to Dr. Laural Benes for a colonoscopy to evaluate his rectal bleeding and on colonoscopy at about 40 cm on the scope she was noted to have near obstructing mass but it was in such a fold that was unable to be removed or biopsied. She was then referred for barium enema which did not show any abnormalities other than diverticulosis. Followup CT virtual colonoscopy did demonstrate a 4 cm mass at about 55 cm in the colon as well as another smaller 5 mm polyp more proximal. Past Medical History  Diagnosis Date  . Blood transfusion   . Cancer   . Diabetes mellitus   . GERD (gastroesophageal reflux disease)   . Heart murmur   . Hyperlipidemia   . Hypertension   . Chronic kidney disease     Past Surgical History  Procedure Date  . Coronary artery bypass graft 2003  . Kidney removed 1960's  . Breast surgery   . Abdominal hysterectomy   . Eye surgery     right    Family History  Problem Relation Age of Onset  . Heart disease Mother   . Heart disease Father      Social History History  Substance Use Topics  . Smoking status: Never Smoker   . Smokeless tobacco: Not on file  . Alcohol Use: No    Allergies  Allergen Reactions  . Penicillins Anaphylaxis  . Novocain (Procaine Hcl)     numbness    Current Outpatient Prescriptions  Medication Sig Dispense Refill  . atenolol (TENORMIN) 25 MG tablet       . clobetasol ointment (TEMOVATE) 0.05 %       . hydrochlorothiazide (HYDRODIURIL) 25 MG tablet       . LORazepam (ATIVAN) 0.5 MG tablet       . sertraline (ZOLOFT) 50 MG tablet       . traMADol (ULTRAM) 50 MG tablet       . cephALEXin (KEFLEX) 500 MG capsule         Review of Systems Review of Systems All other review of systems negative or noncontributory except as stated in the HPI   Blood pressure 136/96, pulse 59, temperature 96.4 F (35.8 C), temperature source Temporal, height 5' (1.524 m), weight 127 lb 3.2 oz (57.698 kg), SpO2 98.00%.  Physical Exam Physical Exam Physical Exam  Nursing note and vitals reviewed. Constitutional: She is oriented to person, place, and time. She appears well-developed and well-nourished. No distress.  HENT:  Head:  Normocephalic and atraumatic.  Mouth/Throat: No oropharyngeal exudate.  Eyes: Conjunctivae and EOM are normal. Pupils are equal, round, and reactive to light. Right eye exhibits no discharge. Left eye exhibits no discharge. No scleral icterus.  Neck: Normal range of motion. Neck supple. No tracheal deviation present.  Cardiovascular: Normal rate, regular rhythm, normal heart sounds and intact distal pulses.   Pulmonary/Chest: Effort normal and breath sounds normal. No stridor. No respiratory distress. She has no wheezes.  Abdominal: Soft. Bowel sounds are normal. She exhibits no distension and no mass. There is no tenderness. There is no rebound and no guarding.  Musculoskeletal: Normal range of motion. She exhibits no edema and no tenderness.  Neurological: She is alert and  oriented to person, place, and time.  Skin: Skin is warm and dry. No rash noted. She is not diaphoretic. No erythema. No pallor.  Psychiatric: She has a normal mood and affect. Her behavior is normal. Judgment and thought content normal.    Data Reviewed Colonoscopy, BE, virtual colonoscopy  Assessment    Left colon mass Rectal bleeding  She has a colonic mass which is suspicious for carcinoma. However, we have no tissue diagnosis to confirm this. It is a fairly large mass on virtual colonoscopy and on regular colonoscopies and located about 55 cm in the left colon. I have recommended surgical removal. I discussed with her and her daughter the options for laparoscopic or open cholectomy and I think that laparoscopic partial colectomy would be the best initial option for this patient. However, in order to identify the actual mass in the area of which needs resection, we would need to set her up for tattooing of the lesion preoperatively. Otherwise, I could perform intraoperative endoscopy at the time of surgery but if I were to do this, laparoscopic colectomy would be difficult due to the gas distention and she would likely need open surgery.  We will set her up for a laparoscopic partial colectomy and after we obtain a surgery date we will see if we can arrange for tattooing of the lesion 2 days prior to her procedure. After her colonoscopy and tattooing, she can remain on clear liquid diet and we will go ahead proceed with her colectomy after that. We discussed the risks and benefits of each option and the risks of the procedure including infection, bleeding, pain, scarring, anastomotic leak, bowel injury, need for open surgery, and possible removal of benign lesion, and need for repeat surgery and possible ostomy and she expressed understanding and desires to proceed with laparoscopic left colectomy.    Plan    Arrange for scheduling of laparoscopic partial colectomy and try to coordinate a time  with [facility] and Dr. Laural Benes to tattoo the lesion 2 days prior to her procedure. Will also check CEA level.        Lodema Pilot DAVID 06/17/2012, 10:51 AM

## 2012-07-08 ENCOUNTER — Other Ambulatory Visit (INDEPENDENT_AMBULATORY_CARE_PROVIDER_SITE_OTHER): Payer: Self-pay

## 2012-07-08 ENCOUNTER — Telehealth (INDEPENDENT_AMBULATORY_CARE_PROVIDER_SITE_OTHER): Payer: Self-pay

## 2012-07-08 NOTE — Telephone Encounter (Signed)
Called [facility] to speak with Dr. Henriette Combs nurse for pre-op tattooing of patient lesion prior to Colectomy on 08/03/12.  Will call office back on Tuesday to speak with Katrina [phone] nurse for Dr. Laural Benes.

## 2012-07-10 HISTORY — PX: COLON SURGERY: SHX602

## 2012-07-20 ENCOUNTER — Encounter (HOSPITAL_COMMUNITY): Payer: Self-pay | Admitting: Pharmacy Technician

## 2012-07-22 ENCOUNTER — Telehealth (INDEPENDENT_AMBULATORY_CARE_PROVIDER_SITE_OTHER): Payer: Self-pay

## 2012-07-22 NOTE — Telephone Encounter (Signed)
Rec'd call from [facility], our office has been advised that patient will need to see Dr. Laural Benes to prevent delays in scheduling.  Informed that Katrina will be calling our office today (07/22/12) with appointment for Ms. Buren.  If no response then our office will need to call Katrina @ [phone] and have her overhead paged.

## 2012-07-22 NOTE — Telephone Encounter (Signed)
Rec'd call from Katrina @ Dr. Henriette Combs office, req'd office notes to be faxed to: [phone] attn: Katrina, she will have Dr. Laural Benes review patient records and contact our office on Monday 07/25/12 with further details (pre-op tattooing)

## 2012-07-22 NOTE — Telephone Encounter (Signed)
[facility] contacted requesting an appointment for Pre-Op Tattooing for Laparoscopic Partial Colectomy scheduled for 08/03/12 w/Dr. Biagio Quint.  We will await a call with date & time of Pre-Op Tattooing.

## 2012-07-25 ENCOUNTER — Telehealth (INDEPENDENT_AMBULATORY_CARE_PROVIDER_SITE_OTHER): Payer: Self-pay

## 2012-07-25 NOTE — Telephone Encounter (Signed)
Office notes faxed to Katrina [phone]) @ Dr. Henriette Combs office for request for Pre-Op Tattooing appointment with Dr. Laural Benes.

## 2012-07-27 ENCOUNTER — Encounter (HOSPITAL_COMMUNITY)
Admission: RE | Admit: 2012-07-27 | Discharge: 2012-07-27 | Disposition: A | Discharge status: Home / Self Care | Payer: Medicare Other | Source: Ambulatory Visit | Attending: General Surgery | Admitting: General Surgery

## 2012-07-27 ENCOUNTER — Encounter (HOSPITAL_COMMUNITY): Payer: Self-pay

## 2012-07-27 VITALS — BP 132/72 | HR 60 | Temp 97.3°F | Resp 20 | Ht 60.0 in | Wt 125.9 lb

## 2012-07-27 DIAGNOSIS — K6389 Other specified diseases of intestine: Secondary | ICD-10-CM

## 2012-07-27 HISTORY — DX: Unspecified osteoarthritis, unspecified site: M19.90

## 2012-07-27 HISTORY — DX: Major depressive disorder, single episode, unspecified: F32.9

## 2012-07-27 HISTORY — DX: Anxiety disorder, unspecified: F41.9

## 2012-07-27 HISTORY — DX: Depression, unspecified: F32.A

## 2012-07-27 LAB — CBC WITH DIFFERENTIAL/PLATELET
Basophils Relative: 1 % (ref 0–1)
Hemoglobin: 15.2 g/dL — ABNORMAL HIGH (ref 12.0–15.0)
Lymphocytes Relative: 36 % (ref 12–46)
Lymphs Abs: 2.6 10*3/uL (ref 0.7–4.0)
Monocytes Relative: 9 % (ref 3–12)
Neutro Abs: 3.9 10*3/uL (ref 1.7–7.7)
Neutrophils Relative %: 53 % (ref 43–77)
RBC: 4.94 MIL/uL (ref 3.87–5.11)
WBC: 7.4 10*3/uL (ref 4.0–10.5)

## 2012-07-27 LAB — COMPREHENSIVE METABOLIC PANEL
Albumin: 3.8 g/dL (ref 3.5–5.2)
Alkaline Phosphatase: 56 U/L (ref 39–117)
BUN: 16 mg/dL (ref 6–23)
Chloride: 103 mEq/L (ref 96–112)
Glucose, Bld: 111 mg/dL — ABNORMAL HIGH (ref 70–99)
Potassium: 3.6 mEq/L (ref 3.5–5.1)
Total Bilirubin: 0.3 mg/dL (ref 0.3–1.2)

## 2012-07-27 LAB — SURGICAL PCR SCREEN
MRSA, PCR: NEGATIVE
Staphylococcus aureus: POSITIVE — AB

## 2012-07-27 NOTE — Progress Notes (Signed)
Dr Katrinka Blazing called for any current ekg,strress test,all cardiac related info.

## 2012-07-27 NOTE — Pre-Procedure Instructions (Signed)
20 SONDI DESCH  07/27/2012   Your procedure is scheduled on:  08/03/12  Report to [facility] at 630 AM.  Call this number if you have problems the morning of surgery: [phone]   Remember:   Do not eat food:After Midnight.     Take these medicines the morning of surgery with A SIP OF WATER: atenolol,ativan,zoloft   Do not wear jewelry, make-up or nail polish.  Do not wear lotions, powders, or perfumes. You may wear deodorant.  Do not shave 48 hours prior to surgery. Men may shave face and neck.  Do not bring valuables to the hospital.  Contacts, dentures or bridgework may not be worn into surgery.  Leave suitcase in the car. After surgery it may be brought to your room.  For patients admitted to the hospital, checkout time is 11:00 AM the day of discharge.   Patients discharged the day of surgery will not be allowed to drive home.  Name and phone number of your driver: family  Special Instructions: CHG Shower Use Special Wash: 1/2 bottle night before surgery and 1/2 bottle morning of surgery.   Please read over the following fact sheets that you were given: Pain Booklet, Coughing and Deep Breathing, MRSA Information and Surgical Site Infection Prevention

## 2012-07-28 ENCOUNTER — Telehealth (INDEPENDENT_AMBULATORY_CARE_PROVIDER_SITE_OTHER): Payer: Self-pay

## 2012-07-28 ENCOUNTER — Encounter (HOSPITAL_COMMUNITY): Payer: Self-pay | Admitting: Vascular Surgery

## 2012-07-28 NOTE — Consult Note (Signed)
Anesthesia Chart Review:  Patient is a 78 year old female scheduled for laparoscopic left colectomy, possible open on 08/03/12 by Dr. Biagio Quint.  History includes non-smoker, CAD s/p CABG (SVG to DIAG, SVG to OM) and MV repair (annuloplasty ring) '03, anemia, breast cancer, GERD, HLD, depression, HTN, anxiety, arthritis, history of right nephrectomy due to large kidney stone '60's.  She reported an anesthesia complication of "I quit breathing."  However, when I clarified this with her she said that she was told it was a reaction to PCN in the '60's.  PCP is Dr. Merlene Laughter.  Her Cardiologist is Dr. Verdis [facility]).  Her last visit was on 12/21/11.  She reported periodic aching across her precordial area.  He prescribed Nitro with instructions to contact him for recurring precordial pain, otherwise he would see her in one year.  She reports that she has not had any further chest pain and has never had to use the Nitroglycerin.  She denies SOB and LE edema.  She bakes cakes during the day and most evenings walks about 7/10 th of a mile (on a hilly roadway) to her daughter's house without CV symptoms.   Her EKG from 07/27/12 showed SB @ 59 bpm, non-specific T wave abnormality.  It is not significantly changed.  She had a nuclear stress test on 11/11/10 that showed normal myocardial perfusion scan demonstrating an attenuation artifact in the anterior region of the myocardium.  No ischemia or infarct/scar seen in the remaining myocardium. Post-stress EF 88%.   Her last cardiac cath was on 09/28/02 prior to her CABG/MV repair.  She has not had a recent echo.  CXR from 07/27/12 showed: 1. No acute cardiopulmonary disease.  2. Status post CABG.   Labs noted.  She will need a T&S on arrival.  I reviewed her cardiac history with Anesthesiologist Dr. Katrinka Blazing.  Anticipate she can proceed if she remains asymptomatic from a CV standpoint.  Shonna Chock, PA-C

## 2012-07-28 NOTE — Telephone Encounter (Signed)
[facility] @ [place] [phone], left message on Scheduling Secretary Marie Burch's voicemail to check status of pre-op tattooing for Marie Burch.  Patient scheduled for surgery on 08/03/12.

## 2012-07-28 NOTE — Telephone Encounter (Signed)
Rec'd call from Katrina @ [facility] [facility] (Dr. Henriette Combs office) Ms. Burress was seen on 07/26/12 for Pre-Op Tattooing per Dr. Delice Lesch request.

## 2012-08-02 MED ORDER — VANCOMYCIN HCL 1000 MG IV SOLR
1500.0000 mg | INTRAVENOUS | Status: AC
  Administered 2012-08-03: 1500 mg via INTRAVENOUS
  Filled 2012-08-02: qty 1500

## 2012-08-03 ENCOUNTER — Encounter (HOSPITAL_COMMUNITY): Payer: Self-pay | Admitting: Vascular Surgery

## 2012-08-03 ENCOUNTER — Ambulatory Visit (HOSPITAL_COMMUNITY): Payer: Medicare Other

## 2012-08-03 ENCOUNTER — Inpatient Hospital Stay (HOSPITAL_COMMUNITY)
Admission: RE | Admit: 2012-08-03 | Discharge: 2012-08-08 | DRG: 330 | Disposition: A | Discharge status: Home / Self Care | Payer: Medicare Other | Source: Ambulatory Visit | Attending: General Surgery | Admitting: General Surgery

## 2012-08-03 ENCOUNTER — Ambulatory Visit (HOSPITAL_COMMUNITY): Payer: Medicare Other | Admitting: Vascular Surgery

## 2012-08-03 ENCOUNTER — Encounter (HOSPITAL_COMMUNITY): Payer: Self-pay | Admitting: *Deleted

## 2012-08-03 ENCOUNTER — Encounter (HOSPITAL_COMMUNITY)
Admission: RE | Disposition: A | Discharge status: Home / Self Care | Payer: Self-pay | Source: Ambulatory Visit | Attending: General Surgery

## 2012-08-03 DIAGNOSIS — C189 Malignant neoplasm of colon, unspecified: Principal | ICD-10-CM | POA: Diagnosis present

## 2012-08-03 DIAGNOSIS — K625 Hemorrhage of anus and rectum: Secondary | ICD-10-CM | POA: Diagnosis present

## 2012-08-03 DIAGNOSIS — Z951 Presence of aortocoronary bypass graft: Secondary | ICD-10-CM

## 2012-08-03 DIAGNOSIS — N189 Chronic kidney disease, unspecified: Secondary | ICD-10-CM | POA: Diagnosis present

## 2012-08-03 DIAGNOSIS — I129 Hypertensive chronic kidney disease with stage 1 through stage 4 chronic kidney disease, or unspecified chronic kidney disease: Secondary | ICD-10-CM | POA: Diagnosis present

## 2012-08-03 DIAGNOSIS — E119 Type 2 diabetes mellitus without complications: Secondary | ICD-10-CM | POA: Diagnosis present

## 2012-08-03 DIAGNOSIS — K6389 Other specified diseases of intestine: Secondary | ICD-10-CM

## 2012-08-03 HISTORY — DX: Malignant neoplasm of colon, unspecified: C18.9

## 2012-08-03 HISTORY — PX: APPENDECTOMY: SHX54

## 2012-08-03 HISTORY — PX: PROCTOSCOPY: SHX2266

## 2012-08-03 HISTORY — PX: LAPAROTOMY: SHX154

## 2012-08-03 LAB — TYPE AND SCREEN: ABO/RH(D): A POS

## 2012-08-03 SURGERY — LAPAROSCOPIC PARTIAL COLECTOMY
Anesthesia: General | Site: Anus | Wound class: Clean Contaminated

## 2012-08-03 MED ORDER — HYDROMORPHONE HCL PF 1 MG/ML IJ SOLN: 0.2500 mg | INTRAMUSCULAR | Status: DC | PRN

## 2012-08-03 MED ORDER — ONDANSETRON HCL 4 MG/2ML IJ SOLN
INTRAMUSCULAR | Status: DC | PRN
  Administered 2012-08-03: 4 mg via INTRAVENOUS

## 2012-08-03 MED ORDER — ROCURONIUM BROMIDE 100 MG/10ML IV SOLN
INTRAVENOUS | Status: DC | PRN
  Administered 2012-08-03: 50 mg via INTRAVENOUS

## 2012-08-03 MED ORDER — MIDAZOLAM HCL 5 MG/5ML IJ SOLN
INTRAMUSCULAR | Status: DC | PRN
  Administered 2012-08-03 (×2): 1 mg via INTRAVENOUS

## 2012-08-03 MED ORDER — ACETAMINOPHEN 10 MG/ML IV SOLN
INTRAVENOUS | Status: AC
  Filled 2012-08-03: qty 100

## 2012-08-03 MED ORDER — BUPIVACAINE HCL (PF) 0.25 % IJ SOLN
INTRAMUSCULAR | Status: AC
  Filled 2012-08-03: qty 30

## 2012-08-03 MED ORDER — LIDOCAINE-EPINEPHRINE (PF) 1 %-1:200000 IJ SOLN
INTRAMUSCULAR | Status: DC | PRN
  Administered 2012-08-03: 10:00:00

## 2012-08-03 MED ORDER — LIDOCAINE-EPINEPHRINE (PF) 1 %-1:200000 IJ SOLN
INTRAMUSCULAR | Status: AC
  Filled 2012-08-03: qty 10

## 2012-08-03 MED ORDER — OXYCODONE HCL 5 MG/5ML PO SOLN
5.0000 mg | Freq: Once | ORAL | Status: DC | PRN
Start: 2012-08-03 — End: 2012-08-03

## 2012-08-03 MED ORDER — FENTANYL CITRATE 0.05 MG/ML IJ SOLN: 50.0000 ug | Freq: Once | INTRAMUSCULAR | Status: DC

## 2012-08-03 MED ORDER — MORPHINE SULFATE (PF) 1 MG/ML IV SOLN
INTRAVENOUS | Status: DC
  Administered 2012-08-03: 8 mg via INTRAVENOUS
  Administered 2012-08-03: 14:00:00 via INTRAVENOUS
  Administered 2012-08-04: 3 mL via INTRAVENOUS
  Administered 2012-08-04: 4 mg via INTRAVENOUS
  Administered 2012-08-04: 3 mL via INTRAVENOUS
  Administered 2012-08-04: 1 mg via INTRAVENOUS
  Administered 2012-08-04: 1 mL via INTRAVENOUS
  Administered 2012-08-04: 2 mg via INTRAVENOUS
  Administered 2012-08-05: 1 mg via INTRAVENOUS
  Filled 2012-08-03: qty 25

## 2012-08-03 MED ORDER — NALOXONE HCL 0.4 MG/ML IJ SOLN: 0.4000 mg | INTRAMUSCULAR | Status: DC | PRN

## 2012-08-03 MED ORDER — ENOXAPARIN SODIUM 40 MG/0.4ML ~~LOC~~ SOLN
40.0000 mg | SUBCUTANEOUS | Status: DC
  Filled 2012-08-03 (×2): qty 0.4

## 2012-08-03 MED ORDER — HEPARIN SODIUM (PORCINE) 5000 UNIT/ML IJ SOLN
5000.0000 [IU] | Freq: Once | INTRAMUSCULAR | Status: AC
  Administered 2012-08-03: 5000 [IU] via SUBCUTANEOUS

## 2012-08-03 MED ORDER — ONDANSETRON HCL 4 MG/2ML IJ SOLN: 4.0000 mg | Freq: Four times a day (QID) | INTRAMUSCULAR | Status: DC | PRN

## 2012-08-03 MED ORDER — KCL IN DEXTROSE-NACL 20-5-0.45 MEQ/L-%-% IV SOLN
INTRAVENOUS | Status: DC
  Administered 2012-08-03 – 2012-08-05 (×4): via INTRAVENOUS
  Administered 2012-08-06: 1000 mL via INTRAVENOUS
  Filled 2012-08-03 (×13): qty 1000

## 2012-08-03 MED ORDER — FENTANYL CITRATE 0.05 MG/ML IJ SOLN
INTRAMUSCULAR | Status: DC | PRN
  Administered 2012-08-03: 50 ug via INTRAVENOUS
  Administered 2012-08-03: 100 ug via INTRAVENOUS
  Administered 2012-08-03 (×4): 50 ug via INTRAVENOUS
  Administered 2012-08-03: 100 ug via INTRAVENOUS

## 2012-08-03 MED ORDER — NEOSTIGMINE METHYLSULFATE 1 MG/ML IJ SOLN
INTRAMUSCULAR | Status: DC | PRN
  Administered 2012-08-03: 3 mg via INTRAVENOUS

## 2012-08-03 MED ORDER — NITROGLYCERIN 0.4 MG SL SUBL: 0.4000 mg | SUBLINGUAL_TABLET | SUBLINGUAL | Status: DC | PRN

## 2012-08-03 MED ORDER — LACTATED RINGERS IV SOLN
INTRAVENOUS | Status: DC | PRN
  Administered 2012-08-03 (×3): via INTRAVENOUS

## 2012-08-03 MED ORDER — ONDANSETRON HCL 4 MG PO TABS: 4.0000 mg | ORAL_TABLET | Freq: Four times a day (QID) | ORAL | Status: DC | PRN

## 2012-08-03 MED ORDER — VECURONIUM BROMIDE 10 MG IV SOLR
INTRAVENOUS | Status: DC | PRN
  Administered 2012-08-03: 1 mg via INTRAVENOUS
  Administered 2012-08-03: 3 mg via INTRAVENOUS

## 2012-08-03 MED ORDER — 0.9 % SODIUM CHLORIDE (POUR BTL) OPTIME
TOPICAL | Status: DC | PRN
  Administered 2012-08-03: 1000 mL

## 2012-08-03 MED ORDER — SODIUM CHLORIDE 0.9 % IR SOLN
Status: DC | PRN
  Administered 2012-08-03: 1000 mL

## 2012-08-03 MED ORDER — GLYCOPYRROLATE 0.2 MG/ML IJ SOLN
INTRAMUSCULAR | Status: DC | PRN
  Administered 2012-08-03: .4 mg via INTRAVENOUS

## 2012-08-03 MED ORDER — LIDOCAINE HCL (CARDIAC) 20 MG/ML IV SOLN
INTRAVENOUS | Status: DC | PRN
  Administered 2012-08-03: 60 mg via INTRAVENOUS

## 2012-08-03 MED ORDER — MIDAZOLAM HCL 2 MG/2ML IJ SOLN: 1.0000 mg | INTRAMUSCULAR | Status: DC | PRN

## 2012-08-03 MED ORDER — METRONIDAZOLE IN NACL 5-0.79 MG/ML-% IV SOLN
INTRAVENOUS | Status: DC | PRN
  Administered 2012-08-03: 500 mg via INTRAVENOUS

## 2012-08-03 MED ORDER — SODIUM CHLORIDE 0.9 % IJ SOLN: 9.0000 mL | INTRAMUSCULAR | Status: DC | PRN

## 2012-08-03 MED ORDER — HEPARIN SODIUM (PORCINE) 5000 UNIT/ML IJ SOLN
INTRAMUSCULAR | Status: AC
  Administered 2012-08-03: 5000 [IU] via SUBCUTANEOUS
  Filled 2012-08-03: qty 1

## 2012-08-03 MED ORDER — METRONIDAZOLE IN NACL 5-0.79 MG/ML-% IV SOLN
500.0000 mg | Freq: Three times a day (TID) | INTRAVENOUS | Status: AC
  Administered 2012-08-03 – 2012-08-04 (×2): 500 mg via INTRAVENOUS
  Filled 2012-08-03 (×3): qty 100

## 2012-08-03 MED ORDER — DIPHENHYDRAMINE HCL 50 MG/ML IJ SOLN: 12.5000 mg | Freq: Four times a day (QID) | INTRAMUSCULAR | Status: DC | PRN

## 2012-08-03 MED ORDER — CIPROFLOXACIN IN D5W 400 MG/200ML IV SOLN
400.0000 mg | Freq: Two times a day (BID) | INTRAVENOUS | Status: AC
  Administered 2012-08-03: 400 mg via INTRAVENOUS
  Filled 2012-08-03: qty 200

## 2012-08-03 MED ORDER — ACETAMINOPHEN 10 MG/ML IV SOLN
INTRAVENOUS | Status: DC | PRN
  Administered 2012-08-03: 1000 mg via INTRAVENOUS

## 2012-08-03 MED ORDER — DIPHENHYDRAMINE HCL 12.5 MG/5ML PO ELIX
12.5000 mg | ORAL_SOLUTION | Freq: Four times a day (QID) | ORAL | Status: DC | PRN
  Filled 2012-08-03: qty 5

## 2012-08-03 MED ORDER — BUPIVACAINE-EPINEPHRINE PF 0.25-1:200000 % IJ SOLN
INTRAMUSCULAR | Status: AC
  Filled 2012-08-03: qty 30

## 2012-08-03 MED ORDER — ATENOLOL 25 MG PO TABS
25.0000 mg | ORAL_TABLET | Freq: Every day | ORAL | Status: DC
  Administered 2012-08-04 – 2012-08-08 (×5): 25 mg via ORAL
  Filled 2012-08-03 (×6): qty 1

## 2012-08-03 MED ORDER — CIPROFLOXACIN IN D5W 400 MG/200ML IV SOLN
INTRAVENOUS | Status: DC | PRN
  Administered 2012-08-03: 400 mg via INTRAVENOUS

## 2012-08-03 MED ORDER — PROMETHAZINE HCL 25 MG/ML IJ SOLN: 6.2500 mg | INTRAMUSCULAR | Status: DC | PRN

## 2012-08-03 MED ORDER — PROPOFOL 10 MG/ML IV BOLUS
INTRAVENOUS | Status: DC | PRN
  Administered 2012-08-03: 120 mg via INTRAVENOUS

## 2012-08-03 MED ORDER — MORPHINE SULFATE (PF) 1 MG/ML IV SOLN
INTRAVENOUS | Status: AC
  Filled 2012-08-03: qty 25

## 2012-08-03 MED ORDER — OXYCODONE HCL 5 MG PO TABS: 5.0000 mg | ORAL_TABLET | Freq: Once | ORAL | Status: DC | PRN

## 2012-08-03 MED ORDER — LORAZEPAM 0.5 MG PO TABS
0.5000 mg | ORAL_TABLET | Freq: Every day | ORAL | Status: DC | PRN
  Administered 2012-08-05 – 2012-08-07 (×3): 0.5 mg via ORAL
  Filled 2012-08-03 (×3): qty 1

## 2012-08-03 MED ORDER — EPHEDRINE SULFATE 50 MG/ML IJ SOLN
INTRAMUSCULAR | Status: DC | PRN
  Administered 2012-08-03 (×2): 10 mg via INTRAVENOUS

## 2012-08-03 MED ORDER — FLEET ENEMA 7-19 GM/118ML RE ENEM
1.0000 | ENEMA | Freq: Once | RECTAL | Status: DC
  Filled 2012-08-03: qty 2

## 2012-08-03 MED ORDER — ALBUMIN HUMAN 5 % IV SOLN
INTRAVENOUS | Status: DC | PRN
  Administered 2012-08-03: 12:00:00 via INTRAVENOUS

## 2012-08-03 SURGICAL SUPPLY — 77 items
APPLIER CLIP ROT 10 11.4 M/L (STAPLE)
BLADE SURG 10 STRL SS (BLADE) ×5 IMPLANT
BLADE SURG ROTATE 9660 (MISCELLANEOUS) ×5 IMPLANT
CANISTER SUCTION 2500CC (MISCELLANEOUS) ×5 IMPLANT
CELLS DAT CNTRL 66122 CELL SVR (MISCELLANEOUS) ×4 IMPLANT
CHLORAPREP W/TINT 26ML (MISCELLANEOUS) ×5 IMPLANT
CLIP APPLIE ROT 10 11.4 M/L (STAPLE) IMPLANT
COVER SURGICAL LIGHT HANDLE (MISCELLANEOUS) ×5 IMPLANT
DECANTER SPIKE VIAL GLASS SM (MISCELLANEOUS) ×5 IMPLANT
DRAPE PROXIMA HALF (DRAPES) ×5 IMPLANT
DRAPE UTILITY 15X26 W/TAPE STR (DRAPE) ×15 IMPLANT
DRAPE WARM FLUID 44X44 (DRAPE) ×5 IMPLANT
ELECT CAUTERY BLADE 6.4 (BLADE) ×10 IMPLANT
ELECT REM PT RETURN 9FT ADLT (ELECTROSURGICAL) ×5
ELECTRODE REM PT RTRN 9FT ADLT (ELECTROSURGICAL) ×4 IMPLANT
ENDOLOOP SUT PDS II  0 18 (SUTURE) ×2
ENDOLOOP SUT PDS II 0 18 (SUTURE) ×8 IMPLANT
GLOVE BIO SURGEON STRL SZ 6.5 (GLOVE) ×5 IMPLANT
GLOVE BIO SURGEON STRL SZ7.5 (GLOVE) ×30 IMPLANT
GLOVE BIOGEL PI IND STRL 7.5 (GLOVE) ×12 IMPLANT
GLOVE BIOGEL PI INDICATOR 7.5 (GLOVE) ×3
GLOVE EUDERMIC 7 POWDERFREE (GLOVE) ×15 IMPLANT
GLOVE SURG SS PI 6.5 STRL IVOR (GLOVE) ×10 IMPLANT
GLOVE SURG SS PI 7.5 STRL IVOR (GLOVE) ×15 IMPLANT
GOWN STRL NON-REIN LRG LVL3 (GOWN DISPOSABLE) ×30 IMPLANT
GOWN STRL REIN XL XLG (GOWN DISPOSABLE) ×15 IMPLANT
HANDLE UNIV ENDO GIA (ENDOMECHANICALS) ×5 IMPLANT
KIT BASIN OR (CUSTOM PROCEDURE TRAY) ×5 IMPLANT
LEGGING LITHOTOMY PAIR STRL (DRAPES) ×10 IMPLANT
LIGASURE 5MM LAPAROSCOPIC (INSTRUMENTS) ×5 IMPLANT
NS IRRIG 1000ML POUR BTL (IV SOLUTION) ×10 IMPLANT
PAD ARMBOARD 7.5X6 YLW CONV (MISCELLANEOUS) ×10 IMPLANT
PENCIL BUTTON HOLSTER BLD 10FT (ELECTRODE) ×5 IMPLANT
RELOAD EGIA 60 MED/THCK PURPLE (STAPLE) ×10 IMPLANT
RTRCTR WOUND ALEXIS 18CM MED (MISCELLANEOUS) ×5
SCISSORS LAP 5X35 DISP (ENDOMECHANICALS) ×5 IMPLANT
SEALER TISSUE G2 CVD JAW 35 (ENDOMECHANICALS) ×4 IMPLANT
SEALER TISSUE G2 CVD JAW 45CM (ENDOMECHANICALS) ×1
SET IRRIG TUBING LAPAROSCOPIC (IRRIGATION / IRRIGATOR) ×5 IMPLANT
SLEEVE ENDOPATH XCEL 5M (ENDOMECHANICALS) ×5 IMPLANT
SLEEVE SURGEON STRL (DRAPES) ×10 IMPLANT
SPECIMEN JAR LARGE (MISCELLANEOUS) ×5 IMPLANT
SPONGE GAUZE 4X4 12PLY (GAUZE/BANDAGES/DRESSINGS) ×15 IMPLANT
SPONGE LAP 18X18 X RAY DECT (DISPOSABLE) ×5 IMPLANT
STAPLER CIRC ILS CVD 25MM (STAPLE) ×5 IMPLANT
STAPLER CUT CVD 40MM BLUE (STAPLE) ×5 IMPLANT
STAPLER CUT RELOAD BLUE (STAPLE) ×5 IMPLANT
STAPLER VISISTAT 35W (STAPLE) ×5 IMPLANT
SURGILUBE 2OZ TUBE FLIPTOP (MISCELLANEOUS) IMPLANT
SUT PDS AB 1 CT  36 (SUTURE) ×2
SUT PDS AB 1 CT 36 (SUTURE) ×8 IMPLANT
SUT PDS AB 1 TP1 96 (SUTURE) ×10 IMPLANT
SUT PROLENE 2 0 CT2 30 (SUTURE) IMPLANT
SUT PROLENE 2 0 KS (SUTURE) IMPLANT
SUT PROLENE 2 0 SH DA (SUTURE) ×5 IMPLANT
SUT SILK 2 0 SH CR/8 (SUTURE) ×5 IMPLANT
SUT SILK 2 0 TIES 10X30 (SUTURE) ×5 IMPLANT
SUT SILK 3 0 SH CR/8 (SUTURE) ×5 IMPLANT
SUT SILK 3 0 TIES 10X30 (SUTURE) ×5 IMPLANT
SYS LAPSCP GELPORT 120MM (MISCELLANEOUS)
SYSTEM LAPSCP GELPORT 120MM (MISCELLANEOUS) IMPLANT
TAPE CLOTH SURG 4X10 WHT LF (GAUZE/BANDAGES/DRESSINGS) ×15 IMPLANT
TOWEL OR 17X24 6PK STRL BLUE (TOWEL DISPOSABLE) ×5 IMPLANT
TOWEL OR 17X26 10 PK STRL BLUE (TOWEL DISPOSABLE) ×5 IMPLANT
TRAY FOLEY CATH 14FRSI W/METER (CATHETERS) ×5 IMPLANT
TRAY LAPAROSCOPIC (CUSTOM PROCEDURE TRAY) ×5 IMPLANT
TRAY PROCTOSCOPIC FIBER OPTIC (SET/KITS/TRAYS/PACK) IMPLANT
TROCAR BALLN 12MMX100 BLUNT (TROCAR) ×5 IMPLANT
TROCAR XCEL 12X100 BLDLESS (ENDOMECHANICALS) ×5 IMPLANT
TROCAR XCEL BLUNT TIP 100MML (ENDOMECHANICALS) ×5 IMPLANT
TROCAR XCEL NON-BLD 5MMX100MML (ENDOMECHANICALS) ×5 IMPLANT
TROCAR Z-THREAD FIOS 12X100MM (TROCAR) ×5 IMPLANT
TUBE CONNECTING 12X1/4 (SUCTIONS) ×5 IMPLANT
TUBE CONNECTING 20X1/4 (TUBING) ×5 IMPLANT
TUBING FILTER THERMOFLATOR (ELECTROSURGICAL) ×5 IMPLANT
WATER STERILE IRR 1000ML POUR (IV SOLUTION) IMPLANT
YANKAUER SUCT BULB TIP NO VENT (SUCTIONS) ×10 IMPLANT

## 2012-08-03 NOTE — Anesthesia Postprocedure Evaluation (Signed)
  Anesthesia Post-op Note  Patient: Marie Burch  Procedure(s) Performed: Procedure(s) (LRB) with comments: LAPAROSCOPIC PARTIAL COLECTOMY (Left) - Laparoscopic Left colectomy LAPAROTOMY (N/A) - Mini Laparotomy PROCTOSCOPY (N/A) APPENDECTOMY (N/A) - incidental appendectomy  Patient Location: PACU  Anesthesia Type: General  Level of Consciousness: awake  Airway and Oxygen Therapy: Patient Spontanous Breathing  Post-op Pain: mild  Post-op Assessment: Post-op Vital signs reviewed, Patient's Cardiovascular Status Stable, Respiratory Function Stable, Patent Airway, No signs of Nausea or vomiting and Pain level controlled  Post-op Vital Signs: stable  Complications: No apparent anesthesia complications

## 2012-08-03 NOTE — Preoperative (Signed)
Beta Blockers   Reason not to administer Beta Blockers:Pt took Atenolol this am. 

## 2012-08-03 NOTE — H&P (Signed)
This patient is referred by Dr. Laural Benes and for evaluation of a left colon mass found on colonoscopy. About 2 months ago she had one episode of rectal bleeding which was a fairly significant amount and she describes this as a dark blood and hasn't had any further episodes since then. She says that she is normally constipated and takes MiraLax frequently to relieve this. Otherwise she has no daily history of colon cancer and has been otherwise asymptomatic except for some occasional lower abdominal discomfort. She does have a history of diverticulosis but has not had any apparent episodes of diverticulitis or antibiotic treatment for diverticulitis. She did have a colonoscopy about 9 years ago and as far she knows this was normal. She was referred to Dr. Laural Benes for a colonoscopy to evaluate his rectal bleeding and on colonoscopy at about 40 cm on the scope she was noted to have near obstructing mass but it was in such a fold that was unable to be removed or biopsied. She was then referred for barium enema which did not show any abnormalities other than diverticulosis. Followup CT virtual colonoscopy did demonstrate a 4 cm mass at about 55 cm in the colon as well as another smaller 5 mm polyp more proximal.  She denies any changes from prior visit.  She has had repeat cscope and tattoo per patient.   Past Medical History   Diagnosis  Date   .  Blood transfusion    .  Cancer    .  Diabetes mellitus    .  GERD (gastroesophageal reflux disease)    .  Heart murmur    .  Hyperlipidemia    .  Hypertension    .  Chronic kidney disease     Past Surgical History   Procedure  Date   .  Coronary artery bypass graft  2003   .  Kidney removed  1960's   .  Breast surgery    .  Abdominal hysterectomy    .  Eye surgery      right    Family History   Problem  Relation  Age of Onset   .  Heart disease  Mother    .  Heart disease  Father     Social History  History   Substance Use Topics   .  Smoking  status:  Never Smoker   .  Smokeless tobacco:  Not on file   .  Alcohol Use:  No    Allergies   Allergen  Reactions   .  Penicillins  Anaphylaxis   .  Novocain (Procaine Hcl)      numbness    Current Outpatient Prescriptions   Medication  Sig  Dispense  Refill   .  atenolol (TENORMIN) 25 MG tablet      .  clobetasol ointment (TEMOVATE) 0.05 %      .  hydrochlorothiazide (HYDRODIURIL) 25 MG tablet      .  LORazepam (ATIVAN) 0.5 MG tablet      .  sertraline (ZOLOFT) 50 MG tablet      .  traMADol (ULTRAM) 50 MG tablet      .  cephALEXin (KEFLEX) 500 MG capsule       Review of Systems  Review of Systems  All other review of systems negative or noncontributory except as stated in the HPI  Wt Readings from Last 3 Encounters:  07/27/12 125 lb 14.4 oz (57.108 kg)  06/17/12 127 lb 3.2 oz (57.698  kg)   Temp Readings from Last 3 Encounters:  08/03/12 97.5 F (36.4 C) Oral  08/03/12 97.5 F (36.4 C) Oral  07/27/12 97.3 F (36.3 C)    BP Readings from Last 3 Encounters:  08/03/12 138/82  08/03/12 138/82  07/27/12 132/72   Pulse Readings from Last 3 Encounters:  08/03/12 56  08/03/12 56  07/27/12 60    Physical Exam  Physical Exam  Physical Exam  Nursing note and vitals reviewed.  Constitutional: She is oriented to person, place, and time. She appears well-developed and well-nourished. No distress.  HENT:  Head: Normocephalic and atraumatic.  Mouth/Throat: No oropharyngeal exudate.  Eyes: Conjunctivae and EOM are normal. Pupils are equal, round, and reactive to light. Right eye exhibits no discharge. Left eye exhibits no discharge. No scleral icterus.  Neck: Normal range of motion. Neck supple. No tracheal deviation present.  Cardiovascular: Normal rate, regular rhythm, normal heart sounds and intact distal pulses.  Pulmonary/Chest: Effort normal and breath sounds normal. No stridor. No respiratory distress. She has no wheezes.  Abdominal: Soft. Bowel sounds are normal.  She exhibits no distension and no mass. There is no tenderness. There is no rebound and no guarding.  Musculoskeletal: Normal range of motion. She exhibits no edema and no tenderness.  Neurological: She is alert and oriented to person, place, and time.  Skin: Skin is warm and dry. No rash noted. She is not diaphoretic. No erythema. No pallor.  Psychiatric: She has a normal mood and affect. Her behavior is normal. Judgment and thought content normal.  Data Reviewed  Colonoscopy, BE, virtual colonoscopy  Assessment   Left colon mass  Rectal bleeding  Lesion tattooed.  She denies any changes from prior. Has mass at about 50cm.  Risks of procedure again discussed in lay terms and informed consent obtained.  Risks of infection, bleeding, anastamotic leak, possible ostomy, injury to bowel or ureter, need for open surgery, benign disease, and the risk of inability of finding the lesion discussed and she desires to proceed with left hemicolectomy.

## 2012-08-03 NOTE — Anesthesia Procedure Notes (Addendum)
Performed by: Angelica Pou   Procedure Name: Intubation Date/Time: 08/03/2012 8:57 AM Performed by: Angelica Pou Pre-anesthesia Checklist: Patient identified, Emergency Drugs available, Suction available, Patient being monitored and Timeout performed Patient Re-evaluated:Patient Re-evaluated prior to inductionOxygen Delivery Method: Circle system utilized Preoxygenation: Pre-oxygenation with 100% oxygen Intubation Type: IV induction Ventilation: Mask ventilation without difficulty Laryngoscope Size: Mac and 3 Grade View: Grade I Tube type: Oral Tube size: 7.5 mm Number of attempts: 1 Airway Equipment and Method: Stylet Placement Confirmation: ETT inserted through vocal cords under direct vision Secured at: 21 cm Tube secured with: Tape Dental Injury: Teeth and Oropharynx as per pre-operative assessment

## 2012-08-03 NOTE — Anesthesia Preprocedure Evaluation (Addendum)
Anesthesia Evaluation  Patient identified by MRN, date of birth, ID band Patient awake    Reviewed: Allergy & Precautions, H&P , NPO status , Patient's Chart, lab work & pertinent test results  Airway Mallampati: I TM Distance: >3 FB Neck ROM: Full    Dental  (+) Teeth Intact and Dental Advisory Given Crowns on upper front:   Pulmonary  breath sounds clear to auscultation        Cardiovascular hypertension, + CAD Rhythm:Regular Rate:Normal  CABG, MVR in 03, reports no current CP or SOB.  Reports good exercise tolerance.    Neuro/Psych Anxiety Depression    GI/Hepatic GERD-  ,  Endo/Other  negative endocrine ROS  Renal/GU Renal diseaseNephrectomy in 60's due to large kidney stone.     Musculoskeletal   Abdominal   Peds  Hematology negative hematology ROS (+)   Anesthesia Other Findings   Reproductive/Obstetrics                          Anesthesia Physical Anesthesia Plan  ASA: III  Anesthesia Plan: General   Post-op Pain Management:    Induction: Intravenous  Airway Management Planned: Oral ETT  Additional Equipment:   Intra-op Plan:   Post-operative Plan: Extubation in OR  Informed Consent: I have reviewed the patients History and Physical, chart, labs and discussed the procedure including the risks, benefits and alternatives for the proposed anesthesia with the patient or authorized representative who has indicated his/her understanding and acceptance.     Plan Discussed with: CRNA and Surgeon  Anesthesia Plan Comments:         Anesthesia Quick Evaluation

## 2012-08-03 NOTE — Transfer of Care (Signed)
Immediate Anesthesia Transfer of Care Note  Patient: Marie Burch  Procedure(s) Performed: Procedure(s) (LRB) with comments: LAPAROSCOPIC PARTIAL COLECTOMY (Left) - Laparoscopic Left colectomy LAPAROTOMY (N/A) - Mini Laparotomy PROCTOSCOPY (N/A) APPENDECTOMY (N/A) - incidental appendectomy  Patient Location: PACU  Anesthesia Type: General  Level of Consciousness: awake, sedated and patient cooperative  Airway & Oxygen Therapy: Patient Spontanous Breathing and Patient connected to face mask oxygen  Post-op Assessment: Report given to PACU RN, Post -op Vital signs reviewed and stable and Patient moving all extremities  Post vital signs: Reviewed and stable  Complications: No apparent anesthesia complications

## 2012-08-04 ENCOUNTER — Encounter (HOSPITAL_COMMUNITY): Payer: Self-pay | Admitting: General Surgery

## 2012-08-04 LAB — BASIC METABOLIC PANEL
BUN: 9 mg/dL (ref 6–23)
GFR calc Af Amer: 71 mL/min — ABNORMAL LOW (ref >90)
GFR calc non Af Amer: 61 mL/min — ABNORMAL LOW (ref >90)
Potassium: 3.3 mEq/L — ABNORMAL LOW (ref 3.5–5.1)
Sodium: 140 mEq/L (ref 135–145)

## 2012-08-04 LAB — CBC
HCT: 33.6 % — ABNORMAL LOW (ref 36.0–46.0)
Hemoglobin: 11.4 g/dL — ABNORMAL LOW (ref 12.0–15.0)
MCHC: 33.3 g/dL (ref 30.0–36.0)
MCHC: 33.3 g/dL (ref 30.0–36.0)
Platelets: 158 10*3/uL (ref 150–400)
RDW: 13.6 % (ref 11.5–15.5)
WBC: 10.3 10*3/uL (ref 4.0–10.5)
WBC: 10.8 10*3/uL — ABNORMAL HIGH (ref 4.0–10.5)

## 2012-08-04 MED ORDER — ENOXAPARIN SODIUM 40 MG/0.4ML ~~LOC~~ SOLN
40.0000 mg | SUBCUTANEOUS | Status: DC
  Administered 2012-08-04 – 2012-08-07 (×4): 40 mg via SUBCUTANEOUS
  Filled 2012-08-04 (×5): qty 0.4

## 2012-08-04 NOTE — Op Note (Signed)
NAMEBRYNLI, Marie Burch NO.:  [id number]  MEDICAL RECORD NO.:  [id number]  LOCATION:  4706                         FACILITY:  [facility]  PHYSICIAN:  Lodema Pilot, MD       DATE OF BIRTH:  1934/01/21  DATE OF PROCEDURE:  08/03/2012 DATE OF DISCHARGE:                              OPERATIVE REPORT   PROCEDURE:  Laparoscopic left hemicolectomy.  PREOPERATIVE DIAGNOSIS:  Left colon mass.  POSTOPERATIVE DIAGNOSIS:  Left colon mass.  SURGEON:  Lodema Pilot, MD  ASSISTANT: 1. Currie Paris, M.D. 2. Mikey Bussing M. Derrell Lolling, M.D.  ANESTHESIA:  General endotracheal tube anesthesia.  FLUIDS:  2.5 L of crystalloid, 500 mL of albumin.  ESTIMATED BLOOD LOSS:  200 mL.  URINE OUTPUT:  800 mL.  DRAINS:  None.  SPECIMENS: 1. Left colon with a suture marking proximal. 2. Additional rectum with the suture marking distally. 3. Proximal ring. 4. Incidental appendectomy.  COMPLICATIONS:  None apparent.  FINDINGS:  Large sigmoid colon mass with extensive diverticulosis and a stapled end-to-end anastomosis with a 25-mm EEA stapler.  INDICATION FOR PROCEDURE:  Ms. Zavalza is a 78 year old female with some rectal bleeding who underwent colonoscopy and was found to have a large mass about 40-50 cm on the scope, but this was unable to be biopsied. She had a CT colonoscopy, which confirmed the mass in this area of concern, and she was referred for surgical excision and sent her back for tattooing of the lesion and marking of the lesion, and she is come to Surgery for definitive diagnosis and treatment.  OPERATIVE DETAILS:  Ms. Meares was seen and evaluated in the preoperative area, and risks and benefits of procedure were again discuss with the patient in lay terms.  Informed consent was obtained.  She had 2 Fleet Enema on the morning of surgery, and was given prophylactic antibiotics and subcu Lovenox for DVT prophylaxis.  She was taken the operating room, placed on table in a  supine position, and general endotracheal tube anesthesia was obtained.  Her arms were tucked bilaterally, and her legs were placed in Yellofin stirrups, and Foley catheter was placed. Her abdomen and buttocks were prepped and draped in a standard surgical fashion.  Procedure time-out was performed with all operative team members to confirm proper patient, procedure, and then a supraumbilical midline incision was made in the skin, and dissection carried down to subcutaneous tissue using blunt dissection.  The abdominal wall fascia was elevated and sharply incised, and the peritoneum entered bluntly.  A 12-mm balloon port was placed, and the laparoscope was introduced, and there was no evidence of bowel injury upon entry.  A 12-mm right lower quadrant trocar was placed, a 12 mm suprapubic trocar was placed, and a 5-mm left lower quadrant trocar was also placed, all under direct visualization.  She was positioned on the table and the small bowel was retracted to the right upper quadrant, and the sigmoid colon was elevated.  The aorta and the aortic bifurcation was identified and I scored the mesentery in the area of the left colic vessels.  I bluntly dissected the vessels just posterior to this, also identified the ureter, which  was actually easy to identify in this patient.  With the ureter identified and the tattooed lesion identified in the area of the iliac vessels, I then skeletonized the left colic.  The inferior mesenteric vessels prior to the bifurcation of the left colic and sigmoid arteries and veins.  I divided the vein first using the EnSeal bipolar cautery and also placed a 0 PDS Endoloop around the stump of the vein.  The artery was divided in similar fashion and another 0 PDS Endoloop was also placed over the stump for added security, and I dissected the plane superficial to the Gerota's fascia, and kept the dissection superficial to the ureter, and I decided to mobilized  the lateral attachment and free up the mass from the sidewall.  I freed up the sigmoid colon along the lateral peritoneal reflection, and this dissection was carried out laterally and cephalad, and the splenic flexure was taken down as well.  I created a window through the mesorectum and elevated the rectum and divided the colon at the rectosigmoid junction with a 60-mm purple TriStaple load because her colon was fairly floppy, and it was difficult to actually divide the mesentery intracorporeally, so I enlarged the lower midline trocar to accommodate a wound retractor.  I was able then to bring up the distal segment of the sigmoid colon and divided the mesentery using the EnSeal device and some 2-0 silk sutures.  She had a hard mass in the colon wall, and also what appeared to be a large lymph node or mass in the mesentery concerning for a malignancy, but there was no evidence of malignancy as seen on the liver or spread in any other place intraperitoneal as the mesentery was divided and selected a point on the more proximal colon, which was felt to be adequate for anastomosis. However, she had diverticulosis along the whole left side of the colon. We selected a spot, which was mostly disease-free from the diverticulosis and divided the colon proximally with another firing of the 60 mm Endo GIA purple TriStaple load.  The ends of the bowel appeared to reach without difficulty, the end of the proximal staple line was taken off, and a 2-0 Prolene suture with pursestring around the proximal end of the colon.  We had used the EEA sizer to measure the diameter of the colon, and the colon was only sufficiently wide to accommodate a 25-mm EEA, and the size of the EEA stapler selected with the end secured on the proximal colon packed with small bowel, and attempted to pass the EEA stapler through the rectum approximately 4-5 cm distal to the distal staple line.  The stapler line kept  getting stuck in this area consistent with a stricture.  The EEA sizer as well would not pass this area of stricture, so another window was created through the mesorectum, and a contour stapler was used to divide the rectum in this area remaining another approximately 5 cm of colon. Suture was placed on the distal margin of the specimen where the suture was placed on the proximal margin of the previous specimen.  Dr. Jamey Ripa passed the EEA stapler again through the rectum at this time and was able to traverse the rectum without difficulty.  The spike was exited through the staple line, and the ends of the bowel were mated.  Then, EEA 25-mm anastomosis was created.  The donuts appeared intact, and these were sent as specimen as well.  The bowel clamp was placed on the proximal  colon, and the abdomen was filled with water and rigid proctoscope was passed, and air was insufflated.  Leak test was performed.  There was some bubbling from one spot on the anterior staple line, which appeared to be very small area of leakage, and there was no obvious hole, but some small bubbles indicating a small leak.  The anterior portion of the staple line was oversewn with 3-0 Lembert silk sutures, and leak test was performed again, which at this time demonstrated no evidence of any air bubbles.  The remainder of the anastomosis appeared adequate without any excessive tension, and mesentery did not appear to be twisted.  The fluid was suctioned from the abdomen, and the abdomen was irrigated with sterile saline solution until the irrigation returned clear.  The fascia was approximated in the midline incision with a #1 PDS suture x2 taking care to avoid injury to the underlying bowel contents with the fascia approximated.  Laparoscope was reintroduced into the abdomen, and the remainder of the fluid was suctioned as well as any areas in the abdomen was inspected for any bleeding.  There was a spot of bleeding  on the omentum, which was taken down from the abdominal wall, and this was controlled with Enseal cautery, and no other evidence of bleeding or bowel injury, and the anastomosis appeared to be intact in the interim.  The remainder of the trocars were removed under direct visualization.  The umbilical fascial incision was approximated with 2-0 Vicryl figure-of-eight sutures, and the wounds were irrigated with sterile saline solution, and the skin edges were approximated with skin staples.  The sterile dressings were applied.  Initially, the instrument count was incorrect as we could not initially find the laparoscoped cleaning pad.  Abdominal x-ray was performed, and Dr. Archer Asa called to confirm that there was no evidence of any retained foreign body on the x-ray.  This was later found while searching through the drapes to make all the sponge, needle, and instrument counts were correct.  The patient was extubated and hemodynamically stable and ready for transfer to the recovery room in a stable condition.          ______________________________ Lodema Pilot, MD     BL/MEDQ  D:  08/03/2012  T:  08/04/2012  Job:  [id number]

## 2012-08-04 NOTE — Progress Notes (Signed)
1 Day Post-Op  Subjective: No complaints, no flatus  Objective: Vital signs in last 24 hours: Temp:  [96.4 F (35.8 C)-98.1 F (36.7 C)] 98.1 F (36.7 C) (09/26 0550) Pulse Rate:  [49-77] 77  (09/26 0550) Resp:  [8-18] 18  (09/26 0550) BP: (90-151)/(47-75) 90/47 mmHg (09/26 0550) SpO2:  [97 %-100 %] 99 % (09/26 0550) Weight:  [132 lb 7.9 oz (60.1 kg)-134 lb 11.2 oz (61.1 kg)] 134 lb 11.2 oz (61.1 kg) (09/26 0550)    Intake/Output from previous day: 09/25 0701 - 09/26 0700 In: 4120.8 [I.V.:3570.8; IV Piggyback:550] Out: 3050 [Urine:2850; Blood:200] Intake/Output this shift:    General appearance: alert, cooperative and no distress Resp: nonlabored Cardio: normal rate, regular GI: soft, appropriate tenderness, ND, wounds without infection, some bruising around lower midline wound  Lab Results:   [facility] 08/04/12 0624  WBC 10.3  HGB 11.2*  HCT 33.6*  PLT 158   BMET  Basename 08/04/12 0624  NA 140  K 3.3*  CL 102  CO2 26  GLUCOSE 151*  BUN 9  CREATININE 0.89  CALCIUM 8.0*   PT/INR No results found for this basename: LABPROT:2,INR:2 in the last 72 hours ABG No results found for this basename: PHART:2,PCO2:2,PO2:2,HCO3:2 in the last 72 hours  Studies/Results: Dg Or Local Abdomen  08/03/2012  *RADIOLOGY REPORT*  Clinical Data:  Incorrect operative count, evaluate for retained sponge  OR LOCAL ABDOMEN  Comparison: CT virtual colonoscopy screening scout abdominal radiographs 06/13/2012  Findings: Frontal views of the abdomen demonstrate interval surgical changes with chain sutures in the right lower quadrant, and cutaneous skin staples in the midline abdomen and projecting over the right iliac crest.  No definite linear radiopacity identified in the abdomen or pelvis to suggest the presence of a retained sponge.  There are two subtle linear opacities oriented vertically, one just above the right iliac crest and the second extending from the iliac crest and above.   These are favored to be artifactual.  Patchy opacity noted in the left lung base.  There is subcutaneous emphysema in the soft tissues of the left mons pubis extending over the left hip and up the left flank.  These are likely related to the recent surgical intervention.  IMPRESSION:  1. No retained object identified.  Specifically, no linear radiopacity to suggest the presence of a retained sponge. 2.  Patchy left basilar opacity 3.  Subcutaneous emphysema extending from the soft tissues of the left mons pubis over the hip and up the left flank, likely related to recent surgical intervention.  These results were called by telephone on 08/03/2012 at 01:00 p.m. to Dr. Biagio Quint, who verbally acknowledged these results.   Original Report Authenticated By: Vilma Prader     Anti-infectives: Anti-infectives     Start     Dose/Rate Route Frequency Ordered Stop   08/03/12 1600   ciprofloxacin (CIPRO) IVPB 400 mg        400 mg 200 mL/hr over 60 Minutes Intravenous Every 12 hours 08/03/12 1523 08/03/12 1738   08/03/12 1530   metroNIDAZOLE (FLAGYL) IVPB 500 mg        500 mg 100 mL/hr over 60 Minutes Intravenous Every 8 hours 08/03/12 1523 08/04/12 0219   08/02/12 1414   vancomycin (VANCOCIN) 1,500 mg in sodium chloride 0.9 % 500 mL IVPB        1,500 mg 250 mL/hr over 120 Minutes Intravenous 60 min pre-op 08/02/12 1414 08/03/12 0840          Assessment/Plan:  s/p Procedure(s) (LRB) with comments: LAPAROSCOPIC PARTIAL COLECTOMY (Left) - Laparoscopic Left colectomy LAPAROTOMY (N/A) - Mini Laparotomy PROCTOSCOPY (N/A) APPENDECTOMY (N/A) - incidental appendectomy repeat HGB and hold lovenox due to decreased HGB.  ambulate.  awaiting return of bowel function  LOS: 1 day    Lodema Pilot DAVID 08/04/2012

## 2012-08-04 NOTE — Progress Notes (Signed)
Occasional confusion with morphine but feeling better.  +flatus. Will likely trial clears if still doing well tomorrow.

## 2012-08-04 NOTE — Progress Notes (Signed)
UR done. 

## 2012-08-05 LAB — CBC WITH DIFFERENTIAL/PLATELET
HCT: 32.1 % — ABNORMAL LOW (ref 36.0–46.0)
Hemoglobin: 10.4 g/dL — ABNORMAL LOW (ref 12.0–15.0)
Lymphocytes Relative: 23 % (ref 12–46)
Lymphs Abs: 2.1 10*3/uL (ref 0.7–4.0)
Monocytes Absolute: 0.8 10*3/uL (ref 0.1–1.0)
Monocytes Relative: 8 % (ref 3–12)
Neutro Abs: 6.3 10*3/uL (ref 1.7–7.7)
WBC: 9.3 10*3/uL (ref 4.0–10.5)

## 2012-08-05 LAB — BASIC METABOLIC PANEL
Chloride: 107 mEq/L (ref 96–112)
Chloride: 109 mEq/L (ref 96–112)
GFR calc Af Amer: 70 mL/min — ABNORMAL LOW (ref >90)
GFR calc Af Amer: 80 mL/min — ABNORMAL LOW (ref >90)
GFR calc non Af Amer: 61 mL/min — ABNORMAL LOW (ref >90)
Glucose, Bld: 150 mg/dL — ABNORMAL HIGH (ref 70–99)
Potassium: 3.8 mEq/L (ref 3.5–5.1)
Potassium: 3.8 mEq/L (ref 3.5–5.1)
Sodium: 143 mEq/L (ref 135–145)

## 2012-08-05 MED ORDER — MORPHINE SULFATE 2 MG/ML IJ SOLN: 2.0000 mg | INTRAMUSCULAR | Status: DC | PRN

## 2012-08-05 MED ORDER — HYDROCODONE-ACETAMINOPHEN 5-325 MG PO TABS
1.0000 | ORAL_TABLET | ORAL | Status: DC | PRN
  Administered 2012-08-05 – 2012-08-08 (×14): 1 via ORAL
  Filled 2012-08-05 (×14): qty 1

## 2012-08-05 NOTE — Progress Notes (Signed)
Tolerating liquids.  Still passing flatus.  Will advance diet as tolerated and my partners will be covering this weekend.

## 2012-08-05 NOTE — Clinical Documentation Improvement (Signed)
Anemia Blood Loss Clarification  THIS DOCUMENT IS NOT A PERMANENT PART OF THE MEDICAL RECORD          08/05/12  Dr. Biagio Quint and/or Associates,  In an effort to better capture your patient's severity of illness, reflect appropriate length of stay and utilization of resources, a review of the patient medical record has revealed the following indicators:  Anesthesia Operative Record 08/03/12 Lactated Ringers        2700 ml Albumin                         250 ml                                         3950 total  Urine     800 ml EBL       200 ml              1000  Total   H&H Trend in [facility] 07/27/12   15.2/44.1 08/04/12   11.2/33.6 08/04/12   11.4/34.2 08/05/12   10.4/32.1  Serial CBC's   Based on your clinical judgment, please document in the progress notes and discharge summary if a condition below provides greater specificity regarding the patient's H&H results:   - Expected Acute Blood Loss Anemia   - Precipitous Drop in Hematocrit   - Combined Expected Acute Blood Loss Anemia and Precipitous Drop in Hematocrit   - Other Condition   - Unable to Clinically Determine   In responding to this query please exercise your independent judgment.    The fact that a query is asked, does not imply that any particular answer is desired or expected.   Reviewed: 08/14/12 - query not addressed - ndrgi.  Another cc already noted per coding in chl.  Mathis Dad RN  Thank You,  Jerral Ralph  RN BSN CCDS Certified Clinical Documentation Specialist: Cell   [phone]  Health Information Management [facility]  RESPOND TO THE THIS QUERY, FOLLOW THE INSTRUCTIONS BELOW:  1. If needed, update documentation for the patient's encounter via the notes activity.  2. Access this query again and click edit on the In [employer].  3. After updating, or not, click F2 to complete all highlighted (required) fields concerning your review. Select "additional documentation in the  medical record" OR "no additional documentation provided".  4. Click Sign note button.  5. The deficiency will fall out of your In Basket *Please let us know if you are not able to complete this workflow by phone or e-mail (listed below).

## 2012-08-05 NOTE — Progress Notes (Signed)
2 Days Post-Op  Subjective: Feeling well.  Pain controlled.  +flatus  Objective: Vital signs in last 24 hours: Temp:  [98.2 F (36.8 C)-99.4 F (37.4 C)] 98.6 F (37 C) (09/27 0617) Pulse Rate:  [70-74] 73  (09/27 0617) Resp:  [13-22] 15  (09/27 0617) BP: (97-137)/(52-64) 110/54 mmHg (09/27 0617) SpO2:  [92 %-100 %] 98 % (09/27 0617) Weight:  [127 lb 14.4 oz (58.015 kg)] 127 lb 14.4 oz (58.015 kg) (09/27 0617) Last BM Date: 08/03/12  Intake/Output from previous day: 09/26 0701 - 09/27 0700 In: 1375 [I.V.:1375] Out: 2200 [Urine:2200] Intake/Output this shift:    General appearance: alert, cooperative and no distress Resp: clear to auscultation bilaterally Cardio: normal rate, regular  GI: soft, minimal tenderness, ND, wounds okay, no peritonitis  Lab Results:   [facility] 08/05/12 0602 08/04/12 1237  WBC 9.3 10.8*  HGB 10.4* 11.4*  HCT 32.1* 34.2*  PLT 173 173   BMET  Basename 08/05/12 0602 08/05/12 0016  NA 144 143  K 3.8 3.8  CL 109 107  CO2 28 29  GLUCOSE 144* 150*  BUN 6 6  CREATININE 0.81 0.90  CALCIUM 8.5 8.5   PT/INR No results found for this basename: LABPROT:2,INR:2 in the last 72 hours ABG No results found for this basename: PHART:2,PCO2:2,PO2:2,HCO3:2 in the last 72 hours  Studies/Results: Dg Or Local Abdomen  08/03/2012  *RADIOLOGY REPORT*  Clinical Data:  Incorrect operative count, evaluate for retained sponge  OR LOCAL ABDOMEN  Comparison: CT virtual colonoscopy screening scout abdominal radiographs 06/13/2012  Findings: Frontal views of the abdomen demonstrate interval surgical changes with chain sutures in the right lower quadrant, and cutaneous skin staples in the midline abdomen and projecting over the right iliac crest.  No definite linear radiopacity identified in the abdomen or pelvis to suggest the presence of a retained sponge.  There are two subtle linear opacities oriented vertically, one just above the right iliac crest and the second  extending from the iliac crest and above.  These are favored to be artifactual.  Patchy opacity noted in the left lung base.  There is subcutaneous emphysema in the soft tissues of the left mons pubis extending over the left hip and up the left flank.  These are likely related to the recent surgical intervention.  IMPRESSION:  1. No retained object identified.  Specifically, no linear radiopacity to suggest the presence of a retained sponge. 2.  Patchy left basilar opacity 3.  Subcutaneous emphysema extending from the soft tissues of the left mons pubis over the hip and up the left flank, likely related to recent surgical intervention.  These results were called by telephone on 08/03/2012 at 01:00 p.m. to Dr. Biagio Quint, who verbally acknowledged these results.   Original Report Authenticated By: Vilma Prader     Anti-infectives: Anti-infectives     Start     Dose/Rate Route Frequency Ordered Stop   08/03/12 1600   ciprofloxacin (CIPRO) IVPB 400 mg        400 mg 200 mL/hr over 60 Minutes Intravenous Every 12 hours 08/03/12 1523 08/03/12 1738   08/03/12 1530   metroNIDAZOLE (FLAGYL) IVPB 500 mg        500 mg 100 mL/hr over 60 Minutes Intravenous Every 8 hours 08/03/12 1523 08/04/12 0219   08/02/12 1414   vancomycin (VANCOCIN) 1,500 mg in sodium chloride 0.9 % 500 mL IVPB        1,500 mg 250 mL/hr over 120 Minutes Intravenous 60 min pre-op 08/02/12 1414  08/03/12 0840          Assessment/Plan: s/p Procedure(s) (LRB) with comments: LAPAROSCOPIC PARTIAL COLECTOMY (Left) - Laparoscopic Left colectomy LAPAROTOMY (N/A) - Mini Laparotomy PROCTOSCOPY (N/A) APPENDECTOMY (N/A) - incidental appendectomy Advance diet looks good and feels better.  trial clears and mobilize  LOS: 2 days    Lodema Pilot DAVID 08/05/2012

## 2012-08-06 NOTE — Progress Notes (Signed)
Patient ID: Marie Burch, female   DOB: 12/10/1933, 78 y.o.   MRN: [id number] 3 Days Post-Op  Subjective: Feeling very good, wants to eat more, passing flatus, wants to go home soon  Objective: Vital signs in last 24 hours: Temp:  [98 F (36.7 C)-98.2 F (36.8 C)] 98.2 F (36.8 C) (09/28 0516) Pulse Rate:  [60-72] 60  (09/28 0516) Resp:  [16-18] 18  (09/28 0516) BP: (104-123)/(54-62) 116/62 mmHg (09/28 0516) SpO2:  [93 %-97 %] 97 % (09/28 0516) Weight:  [124 lb 12.5 oz (56.6 kg)] 124 lb 12.5 oz (56.6 kg) (09/28 0516) Last BM Date: 08/03/12  Intake/Output from previous day: 09/27 0701 - 09/28 0700 In: 2505 [P.O.:1480; I.V.:1025] Out: 1650 [Urine:1650] Intake/Output this shift: Total I/O In: 360 [P.O.:360] Out: 300 [Urine:300]  General appearance: alert, cooperative and no distress Resp: clear to auscultation bilaterally Cardio: normal rate, regular  GI: soft, minimal tenderness, ND, wounds okay, no peritonitis  Lab Results:   [facility] 08/05/12 0602 08/04/12 1237  WBC 9.3 10.8*  HGB 10.4* 11.4*  HCT 32.1* 34.2*  PLT 173 173   BMET  Basename 08/05/12 0602 08/05/12 0016  NA 144 143  K 3.8 3.8  CL 109 107  CO2 28 29  GLUCOSE 144* 150*  BUN 6 6  CREATININE 0.81 0.90  CALCIUM 8.5 8.5   PT/INR No results found for this basename: LABPROT:2,INR:2 in the last 72 hours ABG No results found for this basename: PHART:2,PCO2:2,PO2:2,HCO3:2 in the last 72 hours  Studies/Results: No results found.  Anti-infectives: Anti-infectives     Start     Dose/Rate Route Frequency Ordered Stop   08/03/12 1600   ciprofloxacin (CIPRO) IVPB 400 mg        400 mg 200 mL/hr over 60 Minutes Intravenous Every 12 hours 08/03/12 1523 08/03/12 1738   08/03/12 1530   metroNIDAZOLE (FLAGYL) IVPB 500 mg        500 mg 100 mL/hr over 60 Minutes Intravenous Every 8 hours 08/03/12 1523 08/04/12 0219   08/02/12 1414   vancomycin (VANCOCIN) 1,500 mg in sodium chloride 0.9 % 500 mL IVPB        1,500 mg 250 mL/hr over 120 Minutes Intravenous 60 min pre-op 08/02/12 1414 08/03/12 0840          Assessment/Plan: s/p Procedure(s) (LRB) with comments: LAPAROSCOPIC PARTIAL COLECTOMY (Left) - Laparoscopic Left colectomy LAPAROTOMY (N/A) - Mini Laparotomy PROCTOSCOPY (N/A) APPENDECTOMY (N/A) - incidental appendectomy  --Advance diet as tolerated  --Ambulate  --Likely home tomorrow  --Dressing changes daily l LOS: 3 days    Jimma Ortman 08/06/2012

## 2012-08-06 NOTE — Progress Notes (Signed)
Discussed path with patient. 2/24 positive lymph nodes - colon adenocarcinoma Will need outpatient oncology consult  Wilmon Arms. Corliss Skains, MD, [facility]  08/06/2012 2:02 PM

## 2012-08-07 MED ORDER — DOCUSATE SODIUM 100 MG PO CAPS
100.0000 mg | ORAL_CAPSULE | Freq: Two times a day (BID) | ORAL | Status: DC
  Administered 2012-08-07 – 2012-08-08 (×3): 100 mg via ORAL
  Filled 2012-08-07 (×4): qty 1

## 2012-08-07 MED ORDER — POLYETHYLENE GLYCOL 3350 17 G PO PACK
17.0000 g | PACK | Freq: Every day | ORAL | Status: DC
  Administered 2012-08-07: 17 g via ORAL
  Filled 2012-08-07 (×2): qty 1

## 2012-08-07 NOTE — Progress Notes (Signed)
Patient ID: Marie Burch, female   DOB: [birth year], 78 y.o.   MRN: [id number] 4 Days Post-Op  Subjective: Doing well, tolerating regular diet, +flatus but no BM yet, denies n/v  Objective: Vital signs in last 24 hours: Temp:  [97.7 F (36.5 C)-98.4 F (36.9 C)] 98.4 F (36.9 C) (09/29 0656) Pulse Rate:  [59-63] 59  (09/29 0656) Resp:  [18] 18  (09/29 0656) BP: (137-149)/(65-70) 137/70 mmHg (09/29 0656) SpO2:  [97 %-98 %] 98 % (09/29 0656) Weight:  [125 lb 3.5 oz (56.8 kg)] 125 lb 3.5 oz (56.8 kg) (09/29 0656) Last BM Date: 08/03/12  Intake/Output from previous day: 09/28 0701 - 09/29 0700 In: 2988.8 [P.O.:880; I.V.:2108.8] Out: 3000 [Urine:3000] Intake/Output this shift:    General appearance: alert, cooperative and no distress Resp: clear to auscultation bilaterally Cardio: normal rate, regular  GI: soft, minimal tenderness, ND, wounds okay, no peritonitis  Lab Results:   [facility] 08/05/12 0602 08/04/12 1237  WBC 9.3 10.8*  HGB 10.4* 11.4*  HCT 32.1* 34.2*  PLT 173 173   BMET  Basename 08/05/12 0602 08/05/12 0016  NA 144 143  K 3.8 3.8  CL 109 107  CO2 28 29  GLUCOSE 144* 150*  BUN 6 6  CREATININE 0.81 0.90  CALCIUM 8.5 8.5   PT/INR No results found for this basename: LABPROT:2,INR:2 in the last 72 hours ABG No results found for this basename: PHART:2,PCO2:2,PO2:2,HCO3:2 in the last 72 hours  Studies/Results: No results found.  Anti-infectives: Anti-infectives     Start     Dose/Rate Route Frequency Ordered Stop   08/03/12 1600   ciprofloxacin (CIPRO) IVPB 400 mg        400 mg 200 mL/hr over 60 Minutes Intravenous Every 12 hours 08/03/12 1523 08/03/12 1738   08/03/12 1530   metroNIDAZOLE (FLAGYL) IVPB 500 mg        500 mg 100 mL/hr over 60 Minutes Intravenous Every 8 hours 08/03/12 1523 08/04/12 0219   08/02/12 1414   vancomycin (VANCOCIN) 1,500 mg in sodium chloride 0.9 % 500 mL IVPB        1,500 mg 250 mL/hr over 120 Minutes Intravenous 60 min  pre-op 08/02/12 1414 08/03/12 0840          Assessment/Plan: s/p Procedure(s) (LRB) with comments: LAPAROSCOPIC PARTIAL COLECTOMY (Left) - Laparoscopic Left colectomy LAPAROTOMY (N/A) - Mini Laparotomy PROCTOSCOPY (N/A) APPENDECTOMY (N/A) - incidental appendectomy  --Advance diet as tolerated  --Ambulate  --Likely home tomorrow  --Dressing changes daily l LOS: 4 days    WHITE, ELIZABETH 08/07/2012

## 2012-08-07 NOTE — Progress Notes (Signed)
No bowel movement but having flatus.  Likely good to go home tomorrow..  Will stop IVFs.  Marta Lamas. Gae Bon, MD, FACS [phone] [facility] Surgery

## 2012-08-08 MED ORDER — MAGNESIUM HYDROXIDE 400 MG/5ML PO SUSP
15.0000 mL | Freq: Once | ORAL | Status: AC
  Administered 2012-08-08: 15 mL via ORAL
  Filled 2012-08-08: qty 30

## 2012-08-08 MED ORDER — HYDROCHLOROTHIAZIDE 25 MG PO TABS
12.5000 mg | ORAL_TABLET | Freq: Every day | ORAL | Status: DC
  Filled 2012-08-08: qty 0.5

## 2012-08-08 MED ORDER — ATENOLOL 25 MG PO TABS: 25.0000 mg | ORAL_TABLET | Freq: Every day | ORAL | Status: DC

## 2012-08-08 MED ORDER — DSS 100 MG PO CAPS: 100.0000 mg | ORAL_CAPSULE | Freq: Two times a day (BID) | ORAL | Status: DC

## 2012-08-08 MED ORDER — POLYETHYLENE GLYCOL 3350 17 G PO PACK: 17.0000 g | PACK | Freq: Once | ORAL | Status: DC

## 2012-08-08 MED ORDER — HYDROCHLOROTHIAZIDE 12.5 MG PO CAPS
12.5000 mg | ORAL_CAPSULE | Freq: Every day | ORAL | Status: DC
  Administered 2012-08-08: 12.5 mg via ORAL
  Filled 2012-08-08: qty 1

## 2012-08-08 MED ORDER — HYDROCODONE-ACETAMINOPHEN 5-325 MG PO TABS: 1.0000 | ORAL_TABLET | ORAL | Status: DC | PRN

## 2012-08-08 NOTE — Progress Notes (Signed)
Still no BM but abdomen is nondistended and nontender.  No infection.  She is tolerating diet and should be okay for discharge

## 2012-08-08 NOTE — Progress Notes (Signed)
Pt. Refusing to wear telemetry. Dr. Lindie Spruce notified. Orders to leave pt. Off telemetry. Marie Burch, [name]

## 2012-08-08 NOTE — Progress Notes (Signed)
5 Days Post-Op  Subjective: Tolerating diet.  Wants to go home.  Still no BM but plenty of flatus and no significant abdominal pain.  Objective: Vital signs in last 24 hours: Temp:  [98.2 F (36.8 C)-98.7 F (37.1 C)] 98.7 F (37.1 C) (09/30 0612) Pulse Rate:  [62-69] 69  (09/30 0612) Resp:  [16-18] 18  (09/30 0612) BP: (132-148)/(70-81) 148/81 mmHg (09/30 0612) SpO2:  [96 %-99 %] 98 % (09/30 0612) Weight:  [122 lb 5.7 oz (55.5 kg)] 122 lb 5.7 oz (55.5 kg) (09/30 0612) Last BM Date: 08/03/12  Intake/Output from previous day: 09/29 0701 - 09/30 0700 In: 840 [P.O.:840] Out: -  Intake/Output this shift:    General appearance: alert, cooperative and no distress Resp: clear to auscultation bilaterally Cardio: normal rate, regular GI: soft, nt, nd, wounds okay  Lab Results:  No results found for this basename: WBC:2,HGB:2,HCT:2,PLT:2 in the last 72 hours BMET No results found for this basename: NA:2,K:2,CL:2,CO2:2,GLUCOSE:2,BUN:2,CREATININE:2,CALCIUM:2 in the last 72 hours PT/INR No results found for this basename: LABPROT:2,INR:2 in the last 72 hours ABG No results found for this basename: PHART:2,PCO2:2,PO2:2,HCO3:2 in the last 72 hours  Studies/Results: No results found.  Anti-infectives: Anti-infectives     Start     Dose/Rate Route Frequency Ordered Stop   08/03/12 1600   ciprofloxacin (CIPRO) IVPB 400 mg        400 mg 200 mL/hr over 60 Minutes Intravenous Every 12 hours 08/03/12 1523 08/03/12 1738   08/03/12 1530   metroNIDAZOLE (FLAGYL) IVPB 500 mg        500 mg 100 mL/hr over 60 Minutes Intravenous Every 8 hours 08/03/12 1523 08/04/12 0219   08/02/12 1414   vancomycin (VANCOCIN) 1,500 mg in sodium chloride 0.9 % 500 mL IVPB        1,500 mg 250 mL/hr over 120 Minutes Intravenous 60 min pre-op 08/02/12 1414 08/03/12 0840          Assessment/Plan: s/p Procedure(s) (LRB) with comments: LAPAROSCOPIC PARTIAL COLECTOMY (Left) - Laparoscopic Left  colectomy LAPAROTOMY (N/A) - Mini Laparotomy PROCTOSCOPY (N/A) APPENDECTOMY (N/A) - incidental appendectomy mobilize and should be okay for discharge to home later today.  LOS: 5 days    Lodema Pilot DAVID 08/08/2012

## 2012-08-08 NOTE — Progress Notes (Signed)
All d/c instructions explained as given to pt and family.  Verbalized understanding.  Pt d/c home & escorted by daughter.  Amanda Pea, Charity fundraiser.

## 2012-08-12 ENCOUNTER — Other Ambulatory Visit (INDEPENDENT_AMBULATORY_CARE_PROVIDER_SITE_OTHER): Payer: Self-pay | Admitting: General Surgery

## 2012-08-12 ENCOUNTER — Ambulatory Visit (INDEPENDENT_AMBULATORY_CARE_PROVIDER_SITE_OTHER): Payer: Medicare Other

## 2012-08-12 DIAGNOSIS — Z4802 Encounter for removal of sutures: Secondary | ICD-10-CM

## 2012-08-12 DIAGNOSIS — C189 Malignant neoplasm of colon, unspecified: Secondary | ICD-10-CM

## 2012-08-12 NOTE — Progress Notes (Signed)
Patient comes in 9 days s/p partial colectomy for staple removal. Patient is doing well since surgery. Incisions have healed nicely. No signs of infections or drainage coming from incisions. Staples were removed and steri strips applied. Dr. Biagio Quint was here in office so he stepped in to look at incision. He gave her a copy of here pathology report. An appointment with Dr. Truett Perna with oncology will be made and we will contact her with that appointment.

## 2012-08-15 ENCOUNTER — Telehealth: Payer: Self-pay | Admitting: Oncology

## 2012-08-15 NOTE — Telephone Encounter (Signed)
C/D 08/15/12 for appt.08/16/12

## 2012-08-15 NOTE — Telephone Encounter (Signed)
S/W pt in re NP appt 10/08 @1 :30 w/ Dr. Truett Perna Referring Dr. Biagio Quint Dx-Colon Ca NP packet information will be given on 10/08.

## 2012-08-16 ENCOUNTER — Encounter: Payer: Self-pay | Admitting: Oncology

## 2012-08-16 ENCOUNTER — Ambulatory Visit (HOSPITAL_BASED_OUTPATIENT_CLINIC_OR_DEPARTMENT_OTHER): Payer: Medicare Other | Admitting: Oncology

## 2012-08-16 ENCOUNTER — Telehealth: Payer: Self-pay | Admitting: Oncology

## 2012-08-16 ENCOUNTER — Ambulatory Visit: Payer: Medicare Other

## 2012-08-16 VITALS — BP 134/85 | HR 65 | Temp 97.3°F | Resp 20 | Ht 60.0 in | Wt 123.3 lb

## 2012-08-16 DIAGNOSIS — I10 Essential (primary) hypertension: Secondary | ICD-10-CM

## 2012-08-16 DIAGNOSIS — C189 Malignant neoplasm of colon, unspecified: Secondary | ICD-10-CM

## 2012-08-16 DIAGNOSIS — Z853 Personal history of malignant neoplasm of breast: Secondary | ICD-10-CM

## 2012-08-16 NOTE — Progress Notes (Signed)
[facility] New Patient Consult   Referring MD: Marie Burch 78 y.o.  January 29, 1934    Reason for Referral: Colon cancer     HPI: She had an episode of rectal bleeding and was referred to Dr. Laural Burch. A colonoscopy on 05/31/2012 revealed multiple diverticula in the sigmoid colon. An obstructing polypoid tumor was noted in the sigmoid colon at 40 cm from the anal verge. The lesion could not be biopsied. She was referred for virtual colonoscopy 06/13/2012 and a lobular mass was noted at 54-56 admitted from the anal verge suspicious for colon cancer. A 5 mm polyp was noted at 153 mm. A CT abdomen and pelvis on the same day revealed a cyst in the dome of the left liver, no right kidney, no adenopathy, no pelvic mass or fluid was noted, multiple colonic diverticula.  She was referred to Dr. Biagio Burch and taken to the operating room on 08/09/2012. A laparoscopic left hemicolectomy was performed. A large sigmoid colon mass was noted. A large lymph node was noted in the mesentery. No evidence of malignancy was seen in the liver or elsewhere.  The pathology [id number]) revealed an invasive moderately differentiated adenocarcinoma invading through the muscular propria into the pericolonic fatty tissue. A total 2 of 27 lymph nodes were positive for metastatic carcinoma. A tubular adenoma without evidence of high-grade dysplasia or malignancy was seen. No macroscopic tumor perforation. The tumor was located in the descending colon. Lymphovascular invasion was present. Perineural invasion was not identified. No tumor deposits. The proximal, distal, and mesenteric margins were negative.  She has recovered from surgery.  Past Medical History  Diagnosis Date  . Blood transfusion at the time of a nephrectomy procedure    . GERD (gastroesophageal reflux disease)   . Hyperlipidemia   . Cancer-status post lumpectomy and radiation   2000     breast  . Anxiety   . Depression   .  Arthritis   . Hypertension     dr Marie Burch  . Chronic kidney disease     history of right nephrectomy due to stone '60's  . Complication of anesthesia     "I quit breathing" (patient reported it was due to PCN allergy though '60's)  . Heart murmur     s/p MV ring annuloplasty '03   .    [name]  .    History of coronary artery disease  Past Surgical History  Procedure Date  . Kidney removed 1960's  . Breast surgery    . Abdominal hysterectomy   . Breast lumpectomy  2002   . Eye surgery      lump removed rt eye  . Catarects   . Coronary artery bypass graft 2003    CABG X 2 (SVGs to DIAG and OM)/MV Repair '03  .    .    . Diagnostic laparoscopy   . Laparotomy 08/03/2012    Procedure: LAPAROTOMY;  Surgeon: Marie Pilot, DO;  Location: [facility] OR;  Service: General;  Laterality: N/A;  Mini Laparotomy  . Proctoscopy 08/03/2012    Procedure: PROCTOSCOPY;  Surgeon: Marie Pilot, DO;  Location: [facility] OR;  Service: General;  Laterality: N/A;  . Appendectomy 08/03/2012    Procedure: APPENDECTOMY;  Surgeon: Marie Pilot, DO;  Location: [facility] OR;  Service: General;  Laterality: N/A;  incidental appendectomy    Family History  Problem Relation Age of Onset  . Heart disease/"cancer " Mother   . Heart disease Father  breast cancer                                                                            sister No other family history of cancer Current outpatient prescriptions:atenolol (TENORMIN) 25 MG tablet, Take 25 mg by mouth daily. , Disp: , Rfl: ;  clonazePAM (KLONOPIN) 0.5 MG tablet, Take 0.25-0.5 mg by mouth at bedtime as needed. For sleep, Disp: , Rfl: ;  hydrochlorothiazide (HYDRODIURIL) 25 MG tablet, Take 12.5 mg by mouth daily. , Disp: , Rfl:  HYDROcodone-acetaminophen (NORCO/VICODIN) 5-325 MG per tablet, Take 1 tablet by mouth every 4 (four) hours as needed (pain)., Disp: 40 tablet, Rfl: 0;  lidocaine (LIDODERM) 5 %, Place 0.5-1 patches onto the skin daily as needed. For back pain.  Remove & Discard patch within 12 hours or as directed by MD, Disp: , Rfl: ;  LORazepam (ATIVAN) 0.5 MG tablet, Take 0.5 mg by mouth daily as needed. anxiety, Disp: , Rfl:  Naproxen Sodium (ALEVE) 220 MG CAPS, Take 200 mg by mouth as needed., Disp: , Rfl: ;  Omega-3 Fatty Acids (FISH OIL) 1000 MG CAPS, Take 2,000 mg by mouth 2 (two) times daily., Disp: , Rfl: ;  Polyethylene Glycol 3350 (MIRALAX PO), Take 17 g by mouth daily as needed., Disp: , Rfl: ;  sertraline (ZOLOFT) 50 MG tablet, Take 25 mg by mouth daily. , Disp: , Rfl:  nitroGLYCERIN (NITROSTAT) 0.4 MG SL tablet, Place 0.4 mg under the tongue every 5 (five) minutes as needed. For chest pain, Disp: , Rfl: ;  traMADol (ULTRAM) 50 MG tablet, Take 50 mg by mouth every 6 (six) hours as needed., Disp: , Rfl:   Allergies:  Allergies  Allergen Reactions  . Penicillins Anaphylaxis  . Levaquin (Levofloxacin)   . Niacin And Related Other (See Comments)    unknown  . Norvasc (Amlodipine Besylate) Other (See Comments)    unknown  . Novocain (Procaine Hcl)     numbness  . Pravachol (Pravastatin Sodium) Other (See Comments)    unknown  . Statins Other (See Comments)    unknown  . Sulfa Antibiotics   . Zetia (Ezetimibe) Other (See Comments)    unknown    Social History: She lives with her husband and Marie Burch. She is retired [employer] Burch. She does not use tobacco or alcohol. She was transfused with the nephrectomy procedure in the 1960s. No known risk factor for HIV or hepatitis.   ROS:   Positives include: Chronic constipation, occasional "choking "when drinking liquids, pain at the low legs and feet for one week, left and right chest wall discomfort following surgery  A complete ROS was otherwise negative.  Physical Exam:  Blood pressure 134/85, pulse 65, temperature 97.3 F (36.3 C), temperature source Oral, resp. rate 20, height 5' (1.524 m), weight 123 lb 4.8 oz (55.929 kg).  HEENT: Oropharynx without visible mass, neck without  mass Lungs: Clear bilaterally Cardiac: Regular rate and rhythm Abdomen: Healed surgical incisions, no hepatosplenomegaly, no mass, nontender  Vascular: No leg edema Lymph nodes: No cervical, supra-clavicular, axillar, or inguinal nodes Neurologic: Alert and oriented, the motor exam appears intact in the upper and lower Marie Burch is Skin: No rash   LAB:  CBC  Lab Results  Component Value Date   WBC 9.3 08/05/2012   HGB 10.4* 08/05/2012   HCT 32.1* 08/05/2012   MCV 89.7 08/05/2012   PLT 173 08/05/2012     CMP      Component Value Date/Time   NA 144 08/05/2012 0602   K 3.8 08/05/2012 0602   CL 109 08/05/2012 0602   CO2 28 08/05/2012 0602   GLUCOSE 144* 08/05/2012 0602   BUN 6 08/05/2012 0602   CREATININE 0.81 08/05/2012 0602   CALCIUM 8.5 08/05/2012 0602   PROT 7.1 07/27/2012 0900   ALBUMIN 3.8 07/27/2012 0900   AST 29 07/27/2012 0900   ALT 22 07/27/2012 0900   ALKPHOS 56 07/27/2012 0900   BILITOT 0.3 07/27/2012 0900   GFRNONAA 69* 08/05/2012 0602   GFRAA 80* 08/05/2012 0602   CEA 11.6 on 06/17/2012  Radiology: Chest x-ray on 07/27/2012-no acute cardiopulmonary disease    Assessment/Plan:   1. Stage III (T3, N1) moderately differentiated adenocarcinoma of the left colon, status post a laparoscopic left colectomy on 08/03/2012  2. Elevated preoperative CEA  3. Status post right nephrectomy in the 1960s for management of a "stone "  4. History of breast cancer  5. History of coronary artery disease, status post coronary artery bypass surgery in 2003  6. Incomplete preoperative colonoscopy secondary to obstruction  7. History of hypertension   Disposition:   She has been diagnosed with stage III colon cancer. I discussed the diagnosis, prognosis, and adjuvant treatment options with Marie Burch and her family. We reviewed the surgical pathology report in detail.  I described the benefit associated with adjuvant systemic 5-fluorouracil-based chemotherapy in this setting. We  discussed IV 5-fluorouracil and capecitabine. I also reviewed the small  absolute benefit associated with the addition of oxaliplatin. I reviewed the potential toxicities associated with capecitabine and oxaliplatin.  I recommend adjuvant systemic chemotherapy. She does not wish to receive oxaliplatin. The plan is to proceed with 6 months of adjuvant capecitabine.  We discussed the potential toxicities associated with capecitabine including the chance for mucositis, diarrhea, hematologic toxicity, skin rash, hyperpigmentation, and the hand/foot syndrome. Marie Burch will attend a chemotherapy teaching class. The plan is to begin a first cycle of capecitabine on 08/29/2012. A baseline CBC, chemistry panel, and CEA will be obtained when she returns for chemotherapy teaching class.  She underwent a preoperative chest x-ray and CT of the abdomen/pelvis. We decided against a staging chest CT given the low yield from this procedure.  Marie Burch will return for an office visit on 09/15/2012.  She should undergo a colonoscopy with Dr. Laural Burch within the next 6-12 months.  Marie Burch 08/16/2012, 5:45 PM

## 2012-08-16 NOTE — Telephone Encounter (Signed)
appts made and printed for pt aom °

## 2012-08-16 NOTE — Progress Notes (Signed)
Checked in new patient. Patient paid copay 40.00 by check#[id number]. No financial issues today.

## 2012-08-17 ENCOUNTER — Encounter: Payer: Self-pay | Admitting: Oncology

## 2012-08-17 ENCOUNTER — Other Ambulatory Visit: Payer: Self-pay | Admitting: *Deleted

## 2012-08-17 MED ORDER — CAPECITABINE 500 MG PO TABS: 1500.0000 mg | ORAL_TABLET | Freq: Two times a day (BID) | ORAL | Status: DC

## 2012-08-17 NOTE — Progress Notes (Signed)
Faxed xeloda prescription to [facility] °

## 2012-08-18 ENCOUNTER — Telehealth (INDEPENDENT_AMBULATORY_CARE_PROVIDER_SITE_OTHER): Payer: Self-pay

## 2012-08-18 NOTE — Telephone Encounter (Signed)
Unable to contact patient, called [name] (daughter) and notified of appointment time change from 9:45 to 1:45 w/Dr. Biagio Quint.

## 2012-08-19 ENCOUNTER — Encounter (INDEPENDENT_AMBULATORY_CARE_PROVIDER_SITE_OTHER): Payer: Self-pay | Admitting: General Surgery

## 2012-08-19 ENCOUNTER — Ambulatory Visit (INDEPENDENT_AMBULATORY_CARE_PROVIDER_SITE_OTHER): Payer: Medicare Other | Admitting: General Surgery

## 2012-08-19 ENCOUNTER — Telehealth (INDEPENDENT_AMBULATORY_CARE_PROVIDER_SITE_OTHER): Payer: Self-pay

## 2012-08-19 VITALS — BP 124/76 | HR 70 | Temp 98.2°F | Resp 18 | Ht 60.0 in | Wt 123.8 lb

## 2012-08-19 DIAGNOSIS — Z4889 Encounter for other specified surgical aftercare: Secondary | ICD-10-CM

## 2012-08-19 DIAGNOSIS — Z5189 Encounter for other specified aftercare: Secondary | ICD-10-CM

## 2012-08-19 NOTE — Progress Notes (Signed)
Subjective:     Patient ID: Marie Burch, female   DOB: February 21, 1934, 78 y.o.   MRN: [id number]  HPI This patient follows up status post endoscopic sigmoid colectomy for a stage III colon cancer. She has recovered remarkably well. She is still taking one pain pill per day but that seems to be improving. She has no significant abdominal pain her pain is on the bilateral ribs. She says that her appetite is not great but she has been tolerating foods without difficulty. She says her bowels are okay with 2-3 formed bowel movements per day but she is taking MiraLAX. She saw Dr. Truett Perna and he has prescribed [facility] but she has not started taking it yet.  Review of Systems     Objective:   Physical Exam No acute distress nontoxic-appearing sitting completely on the bed Her abdomen is soft and nontender exam her incisions are healing well without sign of infection. There is no evidence of hernia.    Assessment:     Status post laparoscopic sigmoid colectomy-doing well She seems to be well as schedule for her recovery from this procedure as far as I'm concerned. She is moving her bowels well and is tolerating diet. Not sure what is causing her great pain and I recommended a short term course of Naprosyn for this and if this is not completely resolved then to change to Tylenol to avoid peptic ulcer disease. Again, she seems to be doing very well and there is no evidence of any postoperative complication. She is receiving adjuvant therapy by her medical oncologist and I have also placed referral for radiation oncology because of the lateral abdominal wall proximity, thought I do not think that she will need this.    Plan:     She will begin adjuvant therapies and followup with me in 3-6 months.

## 2012-08-19 NOTE — Telephone Encounter (Signed)
Patient scheduled for Radiation Oncology Consult 08/29/12 @ 8:30 w/Dr. Roselind Messier.

## 2012-08-23 ENCOUNTER — Other Ambulatory Visit: Payer: Medicare Other

## 2012-08-23 ENCOUNTER — Telehealth: Payer: Self-pay | Admitting: *Deleted

## 2012-08-23 ENCOUNTER — Encounter: Payer: Self-pay | Admitting: *Deleted

## 2012-08-23 ENCOUNTER — Other Ambulatory Visit (HOSPITAL_BASED_OUTPATIENT_CLINIC_OR_DEPARTMENT_OTHER): Payer: Medicare Other | Admitting: Lab

## 2012-08-23 DIAGNOSIS — C189 Malignant neoplasm of colon, unspecified: Secondary | ICD-10-CM

## 2012-08-23 LAB — CBC WITH DIFFERENTIAL/PLATELET
Basophils Absolute: 0 10*3/uL (ref 0.0–0.1)
EOS%: 1.8 % (ref 0.0–7.0)
Eosinophils Absolute: 0.1 10*3/uL (ref 0.0–0.5)
LYMPH%: 27.5 % (ref 14.0–49.7)
MCH: 29.7 pg (ref 25.1–34.0)
MCV: 88.9 fL (ref 79.5–101.0)
MONO%: 12 % (ref 0.0–14.0)
NEUT#: 4.2 10*3/uL (ref 1.5–6.5)
Platelets: 274 10*3/uL (ref 145–400)
RBC: 3.93 10*6/uL (ref 3.70–5.45)
RDW: 13.6 % (ref 11.2–14.5)

## 2012-08-23 LAB — COMPREHENSIVE METABOLIC PANEL (CC13)
AST: 29 U/L (ref 5–34)
Alkaline Phosphatase: 66 U/L (ref 40–150)
BUN: 12 mg/dL (ref 7.0–26.0)
Glucose: 110 mg/dl — ABNORMAL HIGH (ref 70–99)
Potassium: 3.7 mEq/L (ref 3.5–5.1)
Sodium: 142 mEq/L (ref 136–145)
Total Bilirubin: 0.3 mg/dL (ref 0.20–1.20)

## 2012-08-23 NOTE — Telephone Encounter (Signed)
Left VM asking about contacting patient regarding co pay assistance.

## 2012-08-23 NOTE — Discharge Summary (Signed)
Physician Discharge Summary  Patient ID: Marie Burch MRN: [id number] DOB/AGE: November 06, 1934 78 y.o.  Admit date: 08/03/2012 Discharge date: 08/23/2012  Admission Diagnoses: colon mass  Discharge Diagnoses: colon cancer Active Problems:  * No active hospital problems. *    Discharged Condition: stable  Hospital Course: to OR 08/03/12 for lap left hemicolectomy.  No complications.  Pain controlled and diet slowly advanced.  She was passing flatus and tolerating the diet advancement to regular diet.  She was not moving her bowels but was nondistended and doing well.  She had some mild confusion with the morphine but her daughter stayed with her and kept her oriented.  SHe was ambulatory and tolerating diet and ready for discharge to home on pod 5.  Consults: None  Significant Diagnostic Studies:   Treatments: surgery: lap left hemicolectomy 08/03/12   Disposition: 01-Home or Self Care  Discharge Orders    Future Appointments: Provider: Department: Dept Phone: Center:   08/23/2012 3:30 PM Krista Blue [facility] [phone] None   08/23/2012 4:00 PM [facility] Edu [facility] [phone] None   08/29/2012 8:00 AM [facility] [phone] None   08/29/2012 8:30 AM Billie Lade, MD [facility] [phone] None   09/15/2012 9:15 AM Ladene Artist, MD [facility] [phone] None     Future Orders Please Complete By Expires   Diet - low sodium heart healthy      Increase activity slowly      Discharge instructions      Comments:   Call [phone] for follow up appointment in 1 week. May shower.   Call MD for:  temperature >100.4      Call MD for:  persistant nausea and vomiting      Call MD for:  severe uncontrolled pain      Call MD for:  redness, tenderness, or signs of infection (pain, swelling, redness, odor or green/yellow discharge around incision site)      Call MD for:  difficulty breathing, headache or visual  disturbances          Medication List     As of 08/23/2012  9:37 AM    TAKE these medications         atenolol 25 MG tablet   Commonly known as: TENORMIN   Take 25 mg by mouth daily.      clonazePAM 0.5 MG tablet   Commonly known as: KLONOPIN   Take 0.25-0.5 mg by mouth at bedtime as needed. For sleep      hydrochlorothiazide 25 MG tablet   Commonly known as: HYDRODIURIL   Take 12.5 mg by mouth daily.      HYDROcodone-acetaminophen 5-325 MG per tablet   Commonly known as: NORCO/VICODIN   Take 1 tablet by mouth every 4 (four) hours as needed (pain).      lidocaine 5 %   Commonly known as: LIDODERM   Place 0.5-1 patches onto the skin daily as needed. For back pain. Remove & Discard patch within 12 hours or as directed by MD      LORazepam 0.5 MG tablet   Commonly known as: ATIVAN   Take 0.5 mg by mouth daily as needed. anxiety      nitroGLYCERIN 0.4 MG SL tablet   Commonly known as: NITROSTAT   Place 0.4 mg under the tongue every 5 (five) minutes as needed. For chest pain      sertraline 50 MG tablet   Commonly known as: ZOLOFT  Take 25 mg by mouth daily.         SignedLodema Pilot DAVID 08/23/2012, 9:37 AM

## 2012-08-23 NOTE — Telephone Encounter (Signed)
Left VM for [facility] requesting them to call with co pay amount and OK to call patient.

## 2012-08-24 ENCOUNTER — Other Ambulatory Visit: Payer: Self-pay | Admitting: Oncology

## 2012-08-24 ENCOUNTER — Telehealth (INDEPENDENT_AMBULATORY_CARE_PROVIDER_SITE_OTHER): Payer: Self-pay

## 2012-08-24 NOTE — Telephone Encounter (Signed)
Patient called in complaining of abdominal pain that comes and goes from time to time. She asked if it was normal. I told patient she still is only 3 weeks out from surgery and could still expect pain. She states she takes a pain pill and it helps but it comes back after 4-6 hrs. I told patient that she can take ibuprofen in between taking her narcotic and to not wait until pain comes back to take something. She needs to stay on top of the pain.

## 2012-08-25 ENCOUNTER — Telehealth: Payer: Self-pay | Admitting: Radiation Oncology

## 2012-08-25 ENCOUNTER — Telehealth (INDEPENDENT_AMBULATORY_CARE_PROVIDER_SITE_OTHER): Payer: Self-pay

## 2012-08-25 ENCOUNTER — Telehealth: Payer: Self-pay | Admitting: *Deleted

## 2012-08-25 NOTE — Telephone Encounter (Signed)
Patient called asking why she has appt here on Monday and why? She was left a voice message on her phone, says she doesn't live near here,and she doesn't feel she needs to be here , informed here I could only see her appt scheduled and that The scheduler is at a  Meeting and will inform them to call her when they get back from said meeting, she can be reached at [phone] 2:55 PM

## 2012-08-25 NOTE — Telephone Encounter (Signed)
Rec'd call from [facility] regarding appointment for patient with Dr. Roselind Messier on 08/29/12 for Radiation Oncology Consult.  Patient wasn't sure why she had this appointment.  I spoke to Ms. Slonecker to explain why we referred her to see Rad Oncology.  Also, I made the appointment with Dr. Roselind Messier since she's seen in the past.  Patient understands and will keep her appointment on 08/29/12.

## 2012-08-26 ENCOUNTER — Encounter: Payer: Self-pay | Admitting: Radiation Oncology

## 2012-08-26 NOTE — Telephone Encounter (Signed)
RECEIVED A FAX FROM [facility] CONCERNING A CONFIRMATION OF PRESCRIPTION SHIPMENT FOR XELODA ON 08/25/12. 

## 2012-08-26 NOTE — Progress Notes (Signed)
Lives with husband .  Retired Air[employer] pilot. S/P Segmental Resection left Descending Colon: Invasive Moderately   Differentiated.

## 2012-08-29 ENCOUNTER — Ambulatory Visit
Admission: RE | Admit: 2012-08-29 | Discharge: 2012-08-29 | Disposition: A | Discharge status: Home / Self Care | Payer: Medicare Other | Source: Ambulatory Visit | Attending: Radiation Oncology | Admitting: Radiation Oncology

## 2012-08-29 ENCOUNTER — Encounter: Payer: Self-pay | Admitting: Radiation Oncology

## 2012-08-29 VITALS — BP 125/75 | HR 70 | Temp 97.6°F | Resp 18 | Ht 60.0 in | Wt 124.0 lb

## 2012-08-29 DIAGNOSIS — C189 Malignant neoplasm of colon, unspecified: Secondary | ICD-10-CM | POA: Insufficient documentation

## 2012-08-29 DIAGNOSIS — E785 Hyperlipidemia, unspecified: Secondary | ICD-10-CM | POA: Insufficient documentation

## 2012-08-29 DIAGNOSIS — K219 Gastro-esophageal reflux disease without esophagitis: Secondary | ICD-10-CM | POA: Insufficient documentation

## 2012-08-29 DIAGNOSIS — Z951 Presence of aortocoronary bypass graft: Secondary | ICD-10-CM | POA: Insufficient documentation

## 2012-08-29 DIAGNOSIS — Z79899 Other long term (current) drug therapy: Secondary | ICD-10-CM | POA: Insufficient documentation

## 2012-08-29 DIAGNOSIS — Z853 Personal history of malignant neoplasm of breast: Secondary | ICD-10-CM | POA: Insufficient documentation

## 2012-08-29 DIAGNOSIS — I129 Hypertensive chronic kidney disease with stage 1 through stage 4 chronic kidney disease, or unspecified chronic kidney disease: Secondary | ICD-10-CM | POA: Insufficient documentation

## 2012-08-29 DIAGNOSIS — N189 Chronic kidney disease, unspecified: Secondary | ICD-10-CM | POA: Insufficient documentation

## 2012-08-29 HISTORY — DX: Personal history of irradiation: Z92.3

## 2012-08-29 HISTORY — DX: Malignant neoplasm of colon, unspecified: C18.9

## 2012-08-29 NOTE — Progress Notes (Addendum)
Patient has a New Primary - Colon cancer.  Prior Radiation therapy Treatment in 2004 for Breast Cancer. Visit Type is a [place] for today.  Confirmed with Dr. Antony Blackbird.

## 2012-08-29 NOTE — Progress Notes (Signed)
Received patient and her husband in the clinic today for a reconsult with Dr. Roselind Messier to discuss the role of radiation therapy. Patient referred by [facility] because of the lateral abdominal wall proximity. Patient alert and oriented to person, place, and time. No distress noted. Steady gait noted. Pleasant affect noted. Patient reports a mild abdominal ache 2 on a scale of 0-10 for which she took 1/2 a pain pill (Norco). Patient reports that she plans to start her xeloda tomorrow. Patient denies nausea and vomiting. Patient reports occasional slight temporal headaches. Patient reports that she is frequently dizzy. Patient reports that her appetite is "not as good as it used to be." Patient reports her average weight is 120 lb. Patient reports that's that problems with constipation continues for which she use Miralax. Patient reports that her incisions continue to heal. Patient denies redness, drainage or edema at incision sites. Reported all findings to Dr. Roselind Messier.  See allergies above Hx of radiation therapy  Denies having a pacemaker

## 2012-08-29 NOTE — Progress Notes (Signed)
Radiation Oncology         [phone] ________________________________  Initial outpatient Consultation  Name: Marie Burch MRN: [id number]  Date: 08/29/2012  DOB: 12/18/1933  WU:[id number],GNF Maisie Fus, MD  Lodema Pilot, DO   REFERRING PHYSICIAN: Lodema Pilot, DO  DIAGNOSIS: The encounter diagnosis was Colon cancer.  HISTORY OF PRESENT ILLNESS::Marie Burch is a 78 y.o. female who is seen out courtesy of Dr. Biagio Quint for an opinion concerning postop radiation therapy as part management of patient's recently diagnosed stage III colon cancer. She presented with constipation problems as well as rectal bleeding.  A colonoscopy on 05/31/2012 revealed multiple diverticula in the sigmoid colon. An obstructing polypoid tumor was noted in the sigmoid colon at 40 cm from the anal verge. The lesion could not be biopsied. She was referred for virtual colonoscopy 06/13/2012 and a lobular mass was noted at 54-56 cms from the anal verge suspicious for colon cancer. A 5 mm polyp was noted at 153 mm. A CT abdomen and pelvis on the same day revealed a cyst in the dome of the left liver, no right kidney, no adenopathy, no pelvic mass or fluid was noted, multiple colonic diverticula.She was referred to Dr. Biagio Quint and taken to the operating room on 08/09/2012. A laparoscopic left hemicolectomy was performed. A large sigmoid colon mass was noted. A large lymph node was noted in the mesentery. No evidence of malignancy was seen in the liver or elsewhere.The pathology [id number]) revealed an invasive moderately differentiated adenocarcinoma invading through the muscular propria into the pericolonic fatty tissue. A total 2 of 27 lymph nodes were positive for metastatic carcinoma. A tubular adenoma without evidence of high-grade dysplasia or malignancy was seen. No macroscopic tumor perforation. The tumor was located in the descending colon/sigmoid colon. Lymphovascular invasion was present. Perineural invasion was not  identified. No tumor deposits. The proximal, distal, and mesenteric margins were negative.  She has been doing well since her surgery.  She did see Dr. Truett Perna who has recommended adjuvant treatment.      PREVIOUS RADIATION THERAPY: Yes she was diagnosed with intraductal right breast cancer in 2003. Patient received radiation treatments to the right breast, 5040 centigrade to the right breast. The site of presentation in the breast was boosted to a cumulative dose of 6300 cGy.  PAST MEDICAL HISTORY:  has a past medical history of Blood transfusion (1960's); GERD (gastroesophageal reflux disease); Hyperlipidemia; Cancer (2003); Anxiety; Depression; Arthritis; Hypertension; Chronic kidney disease; Complication of anesthesia; Heart murmur; Cancer of colon (08/03/12); and S/P radiation therapy (11/29/01 - 03/18/02).    PAST SURGICAL HISTORY: Past Surgical History  Procedure Date  . Kidney removed 1960's  . Breast surgery   . Abdominal hysterectomy   . Breast lumpectomy   . Eye surgery      lump removed rt eye  . Catarects   . Coronary artery bypass graft 2003    CABG X 2 (SVGs to DIAG and OM)/[facility] Repair '03  . Appendectomy   . Colon surgery   . Diagnostic laparoscopy   . Laparotomy 08/03/2012    Procedure: LAPAROTOMY for Left  Hemicolectomy   ;  Surgeon: Lodema Pilot, DO;  Location: [facility] OR;  Service: General;  Laterality: N/A;  Mini Laparotomy  . Proctoscopy 08/03/2012    Procedure: PROCTOSCOPY;  Surgeon: Lodema Pilot, DO;  Location: [facility] OR;  Service: General;  Laterality: N/A;  . Appendectomy 08/03/2012    Procedure: APPENDECTOMY;  Surgeon: Lodema Pilot, DO;  Location: [facility] OR;  Service: General;  Laterality: N/A;  incidental appendectomy    FAMILY HISTORY: family history includes Heart disease in her father and mother.  SOCIAL HISTORY:  reports that she has never smoked. She does not have any smokeless tobacco history on file. She reports that she does not drink alcohol or use illicit  drugs.  ALLERGIES: Penicillins; Levaquin; Niacin and related; Norvasc; Novocain; Pravachol; Statins; Sulfa antibiotics; and Zetia  MEDICATIONS:  Current Outpatient Prescriptions  Medication Sig Dispense Refill  . atenolol (TENORMIN) 25 MG tablet Take 25 mg by mouth daily.       . capecitabine (XELODA) 500 MG tablet Take 3 tablets (1,500 mg total) by mouth 2 (two) times daily after a meal.  84 tablet  0  . clonazePAM (KLONOPIN) 0.5 MG tablet Take 0.25-0.5 mg by mouth at bedtime as needed. For sleep      . hydrochlorothiazide (HYDRODIURIL) 25 MG tablet Take 12.5 mg by mouth daily.       Marland Kitchen HYDROcodone-acetaminophen (NORCO/VICODIN) 5-325 MG per tablet Take 1 tablet by mouth every 4 (four) hours as needed (pain).  40 tablet  0  . lidocaine (LIDODERM) 5 % Place 0.5-1 patches onto the skin daily as needed. For back pain. Remove & Discard patch within 12 hours or as directed by MD      . LORazepam (ATIVAN) 0.5 MG tablet Take 0.5 mg by mouth daily as needed. anxiety      . Naproxen Sodium (ALEVE) 220 MG CAPS Take 200 mg by mouth as needed.      . nitroGLYCERIN (NITROSTAT) 0.4 MG SL tablet Place 0.4 mg under the tongue every 5 (five) minutes as needed. For chest pain      . Omega-3 Fatty Acids (FISH OIL) 1000 MG CAPS Take 2,000 mg by mouth 2 (two) times daily.      . Polyethylene Glycol 3350 (MIRALAX PO) Take 17 g by mouth daily as needed.      . sertraline (ZOLOFT) 50 MG tablet Take 25 mg by mouth daily.       . traMADol (ULTRAM) 50 MG tablet Take 50 mg by mouth every 6 (six) hours as needed.        REVIEW OF SYSTEMS:  A 15 point review of systems is documented in the electronic medical record. This was obtained by the nursing staff. However, I reviewed this with the patient to discuss relevant findings and make appropriate changes. Her appetite is good at this time. She denies any pain in the abdomen or pelvis region. She does complain of a lot of belching.  She denies any rectal bleeding or pain with  bowel movements she denies any urination difficulties or vaginal bleeding.   PHYSICAL EXAM:  height is 5' (1.524 m) and weight is 124 lb (56.246 kg). Her oral temperature is 97.6 F (36.4 C). Her blood pressure is 125/75 and her pulse is 70. Her respiration is 18.   BP 125/75  Pulse 70  Temp 97.6 F (36.4 C) (Oral)  Resp 18  Ht 5' (1.524 m)  Wt 124 lb (56.246 kg)  BMI 24.22 kg/m2  General Appearance:    Alert, cooperative, no distress, appears stated age  Head:    Normocephalic, without obvious abnormality, atraumatic  Eyes:    PERRL, conjunctiva/corneas clear, EOM's intact, fundi    benign, both eyes  Ears:    Normal TM's and external ear canals, both ears  Nose:   Nares normal, septum midline, mucosa normal, no drainage    or sinus tenderness  Throat:   Lips, mucosa, and tongue normal; teeth and gums normal  Neck:   Supple, symmetrical, trachea midline, no adenopathy;    thyroid:  no enlargement/tenderness/nodules; no carotid   bruit or JVD  Back:     Symmetric, no curvature, ROM normal, no CVA tenderness  Lungs:     Clear to auscultation bilaterally, respirations unlabored  Chest Wall:    No tenderness or deformity   Heart:    Regular rate and rhythm, S1 and S2 normal, no murmur, rub   or gallop  Breast Exam:    No tenderness, masses, or nipple abnormality, slight hyperpigmentation changes noted in the right breast,  tattoos present   Abdomen:     Soft, non-tender, bowel sounds active all four quadrants,    no masses, no organomegaly, laparoscopic scars healing well         Extremities:   Extremities normal, atraumatic, no cyanosis or edema  Pulses:   2+ and symmetric all extremities  Skin:   Skin color, texture, turgor normal, no rashes or lesions  Lymph nodes:   Cervical, supraclavicular, and axillary nodes normal  Neurologic:   CNII-XII intact, normal strength, sensation and reflexes    throughout    LABORATORY DATA:  Lab Results  Component Value Date   WBC 7.2  08/23/2012   HGB 11.7 08/23/2012   HCT 35.0 08/23/2012   MCV 88.9 08/23/2012   PLT 274 08/23/2012   Lab Results  Component Value Date   NA 142 08/23/2012   K 3.7 08/23/2012   CL 104 08/23/2012   CO2 28 08/23/2012   Lab Results  Component Value Date   ALT 24 08/23/2012   AST 29 08/23/2012   ALKPHOS 66 08/23/2012   BILITOT 0.30 08/23/2012     RADIOGRAPHY: Dg Or Local Abdomen  08/03/2012  *RADIOLOGY REPORT*  Clinical Data:  Incorrect operative count, evaluate for retained sponge  OR LOCAL ABDOMEN  Comparison: CT virtual colonoscopy screening scout abdominal radiographs 06/13/2012  Findings: Frontal views of the abdomen demonstrate interval surgical changes with chain sutures in the right lower quadrant, and cutaneous skin staples in the midline abdomen and projecting over the right iliac crest.  No definite linear radiopacity identified in the abdomen or pelvis to suggest the presence of a retained sponge.  There are two subtle linear opacities oriented vertically, one just above the right iliac crest and the second extending from the iliac crest and above.  These are favored to be artifactual.  Patchy opacity noted in the left lung base.  There is subcutaneous emphysema in the soft tissues of the left mons pubis extending over the left hip and up the left flank.  These are likely related to the recent surgical intervention.  IMPRESSION:  1. No retained object identified.  Specifically, no linear radiopacity to suggest the presence of a retained sponge. 2.  Patchy left basilar opacity 3.  Subcutaneous emphysema extending from the soft tissues of the left mons pubis over the hip and up the left flank, likely related to recent surgical intervention.  These results were called by telephone on 08/03/2012 at 01:00 p.m. to Dr. Biagio Quint, who verbally acknowledged these results.   Original Report Authenticated By: HEATH       IMPRESSION: Stage III (T3, N1) moderately differentiated adenocarcinoma of the  left colon, status post a laparoscopic left colectomy on 08/03/2012.  The exact location of the patient's lesion is somewhat confusing.  The operative reports suggest this is a large sigmoid  colon mass however on the patient's CT virtual colonoscopy study the lesion appeared to be within the central pelvis area suggesting a potential role for postop adjuvant radiation therapy   PLAN: I will discuss the operative findings with Dr. Biagio Quint. If the patient's lesion is above the peritoneal reflection then she would likely not benefit from adjuvant pelvic radiation therapy  I spent 60 minutes minutes face to face with the patient and more than 50% of that time was spent in counseling and/or coordination of care.   ------------------------------------------------  Billie Lade, PhD, MD

## 2012-08-29 NOTE — Progress Notes (Signed)
See progress note under physician encounter. 

## 2012-09-12 ENCOUNTER — Other Ambulatory Visit: Payer: Self-pay | Admitting: *Deleted

## 2012-09-12 ENCOUNTER — Telehealth: Payer: Self-pay | Admitting: *Deleted

## 2012-09-12 DIAGNOSIS — C20 Malignant neoplasm of rectum: Secondary | ICD-10-CM

## 2012-09-12 NOTE — Telephone Encounter (Signed)
Patient called to report her fingers have become sore. Tonight is last dose of her Xeloda this cycle. Asking if there is anything else to do besides the lotion she is using? Told her to continue the lotion often, avoid extremes in temperature with hands and detergents. Also avoid friction & excess, such as opening jars, typing or sewing. She reports no open areas or blisters.  MD will access at her visit on 11/7.

## 2012-09-12 NOTE — Telephone Encounter (Signed)
THIS REFILL REQUEST FOR XELODA WAS GIVEN TO DR.SHERRILL'S NURSE, SUSAN COWARD,RN. 

## 2012-09-13 MED ORDER — CAPECITABINE 500 MG PO TABS: 1500.0000 mg | ORAL_TABLET | Freq: Two times a day (BID) | ORAL | Status: DC

## 2012-09-13 NOTE — Addendum Note (Signed)
Addended by: Arvilla Meres on: 09/13/2012 02:21 PM   Modules accepted: Orders

## 2012-09-15 ENCOUNTER — Telehealth: Payer: Self-pay | Admitting: Oncology

## 2012-09-15 ENCOUNTER — Ambulatory Visit (HOSPITAL_BASED_OUTPATIENT_CLINIC_OR_DEPARTMENT_OTHER): Payer: Medicare Other | Admitting: Lab

## 2012-09-15 ENCOUNTER — Ambulatory Visit (HOSPITAL_BASED_OUTPATIENT_CLINIC_OR_DEPARTMENT_OTHER): Payer: Medicare Other | Admitting: Oncology

## 2012-09-15 VITALS — BP 136/84 | HR 65 | Temp 97.2°F | Resp 20 | Ht 60.0 in | Wt 122.3 lb

## 2012-09-15 DIAGNOSIS — C189 Malignant neoplasm of colon, unspecified: Secondary | ICD-10-CM

## 2012-09-15 DIAGNOSIS — Z853 Personal history of malignant neoplasm of breast: Secondary | ICD-10-CM

## 2012-09-15 LAB — CBC WITH DIFFERENTIAL/PLATELET
Basophils Absolute: 0 10*3/uL (ref 0.0–0.1)
Eosinophils Absolute: 0.2 10*3/uL (ref 0.0–0.5)
HCT: 37.6 % (ref 34.8–46.6)
LYMPH%: 41 % (ref 14.0–49.7)
MCV: 89.4 fL (ref 79.5–101.0)
MONO#: 0.7 10*3/uL (ref 0.1–0.9)
MONO%: 9.2 % (ref 0.0–14.0)
NEUT#: 3.4 10*3/uL (ref 1.5–6.5)
NEUT%: 47.2 % (ref 38.4–76.8)
Platelets: 200 10*3/uL (ref 145–400)
WBC: 7.2 10*3/uL (ref 3.9–10.3)

## 2012-09-15 NOTE — Progress Notes (Signed)
   [facility]    OFFICE PROGRESS NOTE   INTERVAL HISTORY:   She returns as scheduled. She completed a first cycle of Xeloda beginning on 08/30/2012. Soreness in the mouth without discrete ulcers. No hand/foot pain, nausea, or diarrhea. Intermittent abdominal "cramps ".  Objective:  Vital signs in last 24 hours:  Blood pressure 136/84, pulse 65, temperature 97.2 F (36.2 C), temperature source Oral, resp. rate 20, height 5' (1.524 m), weight 122 lb 4.8 oz (55.475 kg).    HEENT: No thrush or ulcers Resp: Lungs clear bilaterally Cardio: Regular rate and rhythm GI: No hepatosplenomegaly, nontender Vascular: No leg edema  Skin: Palms/soles without erythema    Lab Results:  CBC pending   Medications: I have reviewed the patient's current medications.  Assessment/Plan: 1. Stage III (T3, N1) moderately differentiated adenocarcinoma of the left colon, status post a laparoscopic left colectomy on 08/03/2012. Initiation of adjuvant Xeloda chemotherapy on 08/30/2012. 2. Elevated preoperative CEA , normal on 08/23/2012 3. Status post right nephrectomy in the 1960s for management of a "stone "  4. History of breast cancer  5. History of coronary artery disease, status post coronary artery bypass surgery in 2003  6. Incomplete preoperative colonoscopy secondary to obstruction  7. History of hypertension   Disposition:  She tolerated the first cycle of adjuvant Xeloda well. The plan is to begin cycle 2 on 09/20/2012. Ms. Leveille will return for an office and lab visit on 10/10/2012. She will contact us for increased mouth soreness or new symptoms.   Thornton Papas, MD  09/15/2012  10:11 AM

## 2012-09-15 NOTE — Telephone Encounter (Signed)
appts made and were printed for pt

## 2012-09-16 NOTE — Telephone Encounter (Signed)
RECEIVED A FAX FROM [employer] CONCERNING A CONFIRMATION OF PRESCRIPTION SHIPMENT FOR XELODA ON 09/15/12.

## 2012-09-23 ENCOUNTER — Encounter: Payer: Self-pay | Admitting: Oncology

## 2012-09-23 NOTE — Progress Notes (Signed)
Received approval letter from [employer].  Pt is approved for the drug Xeloga from 08/24/12 to 08/23/13.  I emailed copy of letter to Peyton Najjar w/ the billing department.

## 2012-09-30 ENCOUNTER — Other Ambulatory Visit: Payer: Self-pay | Admitting: *Deleted

## 2012-09-30 DIAGNOSIS — C20 Malignant neoplasm of rectum: Secondary | ICD-10-CM

## 2012-09-30 NOTE — Telephone Encounter (Signed)
THIS REFILL REQUEST FOR XELODA WAS GIVEN TO DR.SHERRILL'S NURSE, SUSAN COWARD,RN. 

## 2012-10-03 ENCOUNTER — Telehealth: Payer: Self-pay | Admitting: *Deleted

## 2012-10-03 MED ORDER — CAPECITABINE 500 MG PO TABS: 1500.0000 mg | ORAL_TABLET | Freq: Two times a day (BID) | ORAL | Status: DC

## 2012-10-03 NOTE — Telephone Encounter (Signed)
RECEIVED A FAX FROM [employer] CONCERNING A CONFIRMATION OF FACSIMILE RECEIPT FOR PT. REFERRAL. 

## 2012-10-03 NOTE — Addendum Note (Signed)
Addended by: Arvilla Meres on: 10/03/2012 10:08 AM   Modules accepted: Orders

## 2012-10-03 NOTE — Telephone Encounter (Signed)
Returned call to daughter Marie Burch regarding pt's hands red/tender; "anything else we can do?"  Ms. Marie Burch stated that she takes her last dose today and then will be "on her 7 day rest"  Instructed pt's daughter again about using lotion, avoiding extreme temps and avoid opening jars, etc.  Pt's daughter verbalized understanding and requested  Chemo education material that was handed out in chemo class.  Reports "my mother misplaced them and we would like another copy"  Message left for Aletta--chemo education RN for request.

## 2012-10-04 ENCOUNTER — Telehealth: Payer: Self-pay | Admitting: *Deleted

## 2012-10-04 NOTE — Telephone Encounter (Signed)
Spoke with dtg, pam about getting another folder.  Will leave with Dr. Kalman Drape nurse for pt. To pick up at her app't on 12/2.

## 2012-10-04 NOTE — Telephone Encounter (Signed)
RECEIVED A FAX FROM [employer] CONCERNING A CONFIRMATION OF PRESCRIPTION SHIPMENT FOR XELODA ON 10/03/12.

## 2012-10-10 ENCOUNTER — Other Ambulatory Visit (HOSPITAL_BASED_OUTPATIENT_CLINIC_OR_DEPARTMENT_OTHER): Payer: Medicare Other | Admitting: Lab

## 2012-10-10 ENCOUNTER — Ambulatory Visit (HOSPITAL_BASED_OUTPATIENT_CLINIC_OR_DEPARTMENT_OTHER): Payer: Medicare Other | Admitting: Nurse Practitioner

## 2012-10-10 ENCOUNTER — Telehealth: Payer: Self-pay | Admitting: Oncology

## 2012-10-10 VITALS — BP 122/75 | HR 62 | Temp 96.8°F | Resp 18 | Ht 60.0 in | Wt 119.9 lb

## 2012-10-10 DIAGNOSIS — L98499 Non-pressure chronic ulcer of skin of other sites with unspecified severity: Secondary | ICD-10-CM

## 2012-10-10 DIAGNOSIS — L270 Generalized skin eruption due to drugs and medicaments taken internally: Secondary | ICD-10-CM

## 2012-10-10 DIAGNOSIS — C189 Malignant neoplasm of colon, unspecified: Secondary | ICD-10-CM

## 2012-10-10 LAB — COMPREHENSIVE METABOLIC PANEL (CC13)
ALT: 16 U/L (ref 0–55)
AST: 21 U/L (ref 5–34)
Albumin: 3.4 g/dL — ABNORMAL LOW (ref 3.5–5.0)
Alkaline Phosphatase: 58 U/L (ref 40–150)
BUN: 14 mg/dL (ref 7.0–26.0)
Calcium: 9.7 mg/dL (ref 8.4–10.4)
Chloride: 102 mEq/L (ref 98–107)
Potassium: 4.3 mEq/L (ref 3.5–5.1)
Sodium: 143 mEq/L (ref 136–145)
Total Protein: 6.4 g/dL (ref 6.4–8.3)

## 2012-10-10 LAB — CBC WITH DIFFERENTIAL/PLATELET
BASO%: 0.6 % (ref 0.0–2.0)
HCT: 37.2 % (ref 34.8–46.6)
LYMPH%: 32.3 % (ref 14.0–49.7)
MCH: 31.5 pg (ref 25.1–34.0)
MCHC: 34.1 g/dL (ref 31.5–36.0)
MCV: 92.5 fL (ref 79.5–101.0)
MONO#: 1.1 10*3/uL — ABNORMAL HIGH (ref 0.1–0.9)
MONO%: 13.1 % (ref 0.0–14.0)
NEUT%: 51.4 % (ref 38.4–76.8)
Platelets: 263 10*3/uL (ref 145–400)

## 2012-10-10 NOTE — Patient Instructions (Signed)
Begin cycle 3 Xeloda on 10/18/12 if hands and feet are better. Reduce the dose of Xeloda to 1000mg  twice a day (2 tabs twice a day) for 14 days followed by a 7 day break.

## 2012-10-10 NOTE — Telephone Encounter (Signed)
gv and printed appt schedule for pt for DEC.... °

## 2012-10-10 NOTE — Progress Notes (Signed)
OFFICE PROGRESS NOTE  Interval history:  Marie Burch returns as scheduled. She completed cycle 2 adjuvant Xeloda beginning 09/20/2012. She contacted the office on 10/03/2012 to report hand redness and pain. She continues to note hand redness. The pain is better. She also developed foot pain and redness. In addition she has a "sore" at the perineum. She reports a remote history of an injury to this area. She denies nausea/vomiting. Her mouth intermittently feels "sore". She did not develop any ulcerations. She is having intermittent loose stools. She takes Imodium as needed.   Objective: Blood pressure 122/75, pulse 62, temperature 96.8 F (36 C), temperature source Oral, resp. rate 18, height 5' (1.524 m), weight 119 lb 14.4 oz (54.386 kg).  Oropharynx is without thrush or ulceration. Lungs are clear. Regular cardiac rhythm. Abdomen is soft and nontender. No hepatomegaly. Extremities are without edema. Calves are soft and nontender. Palms and soles are erythematous. No skin breakdown. Superficial ulceration at the perineum.  Lab Results: Lab Results  Component Value Date   WBC 8.1 10/10/2012   HGB 12.7 10/10/2012   HCT 37.2 10/10/2012   MCV 92.5 10/10/2012   PLT 263 10/10/2012    Chemistry:    Chemistry      Component Value Date/Time   NA 142 08/23/2012 1531   NA 144 08/05/2012 0602   K 3.7 08/23/2012 1531   K 3.8 08/05/2012 0602   CL 104 08/23/2012 1531   CL 109 08/05/2012 0602   CO2 28 08/23/2012 1531   CO2 28 08/05/2012 0602   BUN 12.0 08/23/2012 1531   BUN 6 08/05/2012 0602   CREATININE 0.9 08/23/2012 1531   CREATININE 0.81 08/05/2012 0602      Component Value Date/Time   CALCIUM 10.0 08/23/2012 1531   CALCIUM 8.5 08/05/2012 0602   ALKPHOS 66 08/23/2012 1531   ALKPHOS 56 07/27/2012 0900   AST 29 08/23/2012 1531   AST 29 07/27/2012 0900   ALT 24 08/23/2012 1531   ALT 22 07/27/2012 0900   BILITOT 0.30 08/23/2012 1531   BILITOT 0.3 07/27/2012 0900       Studies/Results: No  results found.  Medications: I have reviewed the patient's current medications.  Assessment/Plan:  1. Stage III (T3 N1) moderately differentiated adenocarcinoma of the left colon status post laparoscopic left colectomy on 08/03/2012. Initiation of adjuvant Xeloda chemotherapy on 08/30/2012. She began cycle 2 on 09/20/2012. 2. Hand-foot syndrome secondary to Xeloda. 3. Superficial ulceration at the perineum. Question if related to Xeloda. She will followup with her gynecologist. 4. Elevated preoperative CEA. Normal on 08/23/2012. 5. Status post right nephrectomy in the 1960s for management of a "stone". 6. History of breast cancer. 7. History of coronary artery disease status post coronary artery bypass surgery in 2003. 8. Incomplete preoperative colonoscopy secondary to obstruction. 9. History of hypertension.  Disposition-Marie Burch has completed 2 cycles of adjuvant Xeloda chemotherapy. She has developed hand-foot syndrome and also has an area of ulceration at the perineum that may be related to Xeloda as well. She is due to begin cycle 3 Xeloda on 10/11/2012. We will delay the start of cycle 3 until 10/18/2012 with plans to proceed at that time at a reduced dose of 1000 mg twice daily if the symptoms are better. If the symptoms are not better she will contact the office. She will return for a followup visit with Dr. Truett Perna on 11/03/2012.  Plan reviewed with Dr. Truett Perna.  Lonna Cobb ANP/GNP-BC

## 2012-10-11 ENCOUNTER — Encounter (INDEPENDENT_AMBULATORY_CARE_PROVIDER_SITE_OTHER): Payer: Self-pay | Admitting: General Surgery

## 2012-10-14 ENCOUNTER — Encounter (INDEPENDENT_AMBULATORY_CARE_PROVIDER_SITE_OTHER): Payer: Self-pay | Admitting: General Surgery

## 2012-10-20 ENCOUNTER — Telehealth: Payer: Self-pay | Admitting: *Deleted

## 2012-10-20 NOTE — Telephone Encounter (Signed)
Left message on voicemail for pt to call office. Need to know if she resumed Xeloda/ If palmar desquamation worsened since last office visit.

## 2012-10-20 NOTE — Telephone Encounter (Signed)
Message from pt reporting the skin is peeling off her eyelids as it did with her hands. Lashes are falling out. It is interfering with her sight. Left several messages at home # for pt to return my call.

## 2012-10-21 ENCOUNTER — Telehealth: Payer: Self-pay | Admitting: *Deleted

## 2012-10-21 NOTE — Telephone Encounter (Signed)
Spoke with pt, she reports her eyelids were peeling, but they are a little better today. She started this cycle of Xeloda on 10/17/12. Hands are peeling but are not painful. Reviewed with Dr. Truett Perna: Likely related to the Xeloda, continue at current dose. Call office for pain, redness. Pt voiced understanding. Reports episode of loose stools D1 of this cycle, resolved with Imodium.

## 2012-10-27 ENCOUNTER — Other Ambulatory Visit: Payer: Self-pay | Admitting: *Deleted

## 2012-10-27 NOTE — Telephone Encounter (Signed)
THIS REFILL REQUEST FOR XELODA WAS GIVEN TO DR.SHERRILL'S NURSE, AMY HORTON,RN. 

## 2012-10-28 ENCOUNTER — Other Ambulatory Visit: Payer: Self-pay | Admitting: Medical Oncology

## 2012-10-28 DIAGNOSIS — C20 Malignant neoplasm of rectum: Secondary | ICD-10-CM

## 2012-10-28 MED ORDER — CAPECITABINE 500 MG PO TABS: 1000.0000 mg | ORAL_TABLET | Freq: Two times a day (BID) | ORAL | Status: DC

## 2012-11-01 NOTE — Telephone Encounter (Signed)
RECEIVED A FAX FROM [employer] CONCERNING A CONFIRMATION OF FACSIMILE RECEIPT AND PRESCRIPTION SHIPMENT FOR XELODA ON 10/31/12.

## 2012-11-03 ENCOUNTER — Telehealth: Payer: Self-pay | Admitting: Oncology

## 2012-11-03 ENCOUNTER — Ambulatory Visit (HOSPITAL_BASED_OUTPATIENT_CLINIC_OR_DEPARTMENT_OTHER): Payer: Medicare Other | Admitting: Oncology

## 2012-11-03 ENCOUNTER — Other Ambulatory Visit (HOSPITAL_BASED_OUTPATIENT_CLINIC_OR_DEPARTMENT_OTHER): Payer: Medicare Other | Admitting: Lab

## 2012-11-03 VITALS — BP 128/75 | HR 66 | Temp 97.1°F | Resp 18 | Ht 60.0 in | Wt 123.0 lb

## 2012-11-03 DIAGNOSIS — C189 Malignant neoplasm of colon, unspecified: Secondary | ICD-10-CM

## 2012-11-03 DIAGNOSIS — Z853 Personal history of malignant neoplasm of breast: Secondary | ICD-10-CM

## 2012-11-03 LAB — CBC WITH DIFFERENTIAL/PLATELET
BASO%: 1 % (ref 0.0–2.0)
Basophils Absolute: 0.1 10*3/uL (ref 0.0–0.1)
EOS%: 4.2 % (ref 0.0–7.0)
MCH: 33.2 pg (ref 25.1–34.0)
MCHC: 34.8 g/dL (ref 31.5–36.0)
MCV: 95.3 fL (ref 79.5–101.0)
MONO%: 7.7 % (ref 0.0–14.0)
RDW: 22.6 % — ABNORMAL HIGH (ref 11.2–14.5)
lymph#: 2.3 10*3/uL (ref 0.9–3.3)

## 2012-11-03 LAB — COMPREHENSIVE METABOLIC PANEL (CC13)
ALT: 22 U/L (ref 0–55)
AST: 25 U/L (ref 5–34)
Albumin: 3.6 g/dL (ref 3.5–5.0)
Alkaline Phosphatase: 50 U/L (ref 40–150)
BUN: 14 mg/dL (ref 7.0–26.0)
Calcium: 9.6 mg/dL (ref 8.4–10.4)
Chloride: 106 mEq/L (ref 98–107)
Creatinine: 1 mg/dL (ref 0.6–1.1)
Potassium: 4 mEq/L (ref 3.5–5.1)

## 2012-11-03 NOTE — Progress Notes (Signed)
    [facility]    OFFICE PROGRESS NOTE   INTERVAL HISTORY:   She returns as scheduled. She began another cycle of Xeloda on 10/18/2012. The Xeloda was dose reduced. She developed desquamation of the skin at the soles. Less erythema and pain at the palms and soles. No mouth sores, nausea, or diarrhea. The vaginal ulcer has improved with a cream prescribed by her gynecologist.  Objective:  Vital signs in last 24 hours:  Blood pressure 128/75, pulse 66, temperature 97.1 F (36.2 C), temperature source Oral, resp. rate 18, height 5' (1.524 m), weight 123 lb (55.792 kg).    HEENT: No thrush or ulcers Resp: Lungs clear bilaterally Cardio: Regular rate and rhythm GI: Nontender, no hepatomegaly Vascular: No leg edema  Skin: Mild erythema at the palms without skin breakdown. Faint erythema at the soles with dry superficial desquamation   Lab Results:  Lab Results  Component Value Date   WBC 6.2 11/03/2012   HGB 13.0 11/03/2012   HCT 37.4 11/03/2012   MCV 95.3 11/03/2012   PLT 163 11/03/2012   ANC 3.1  Medications: I have reviewed the patient's current medications.  Assessment/Plan: 1. Stage III (T3 N1) moderately differentiated adenocarcinoma of the left colon status post laparoscopic left colectomy on 08/03/2012. Initiation of adjuvant Xeloda chemotherapy on 08/30/2012. She began cycle 3 on 10/18/2012 to 2. Hand-foot syndrome secondary to Xeloda. Improved.h her gynecologist. 3. Superficial ulceration at the perineum. Question if related to Xeloda. She saw her gynecologist and reports improvement with topical therapy. 4. Elevated preoperative CEA. Normal on 08/23/2012. 5. Status post right nephrectomy in the 1960s for management of a "stone". 6. History of breast cancer. 7. History of coronary artery disease status post coronary artery bypass surgery in 2003. 8. Incomplete preoperative colonoscopy secondary to obstruction. 9. History of  hypertension.  Disposition:  She has completed 3 cycles of adjuvant Xeloda. She appears to have tolerated cycle 3 with less hand/foot symptoms. Ms. Nikkel will begin cycle 4 on 11/08/2012. She will return for an office visit on 11/25/2011. She knows to discontinue Xeloda and contact us if she develops increased hand/foot erythema or pain with this cycle.   Thornton Papas, MD  11/03/2012  10:07 AM

## 2012-11-03 NOTE — Telephone Encounter (Signed)
App made and printed for pt aom

## 2012-11-17 ENCOUNTER — Other Ambulatory Visit: Payer: Self-pay | Admitting: *Deleted

## 2012-11-17 NOTE — Telephone Encounter (Signed)
THIS REFILL REQUEST FOR XELODA WAS GIVEN TO DR.SHERRILL'S NURSE, TANYA WHITLOCK,RN. 

## 2012-11-18 ENCOUNTER — Encounter: Payer: Self-pay | Admitting: Oncology

## 2012-11-23 ENCOUNTER — Telehealth: Payer: Self-pay | Admitting: *Deleted

## 2012-11-23 ENCOUNTER — Encounter (INDEPENDENT_AMBULATORY_CARE_PROVIDER_SITE_OTHER): Payer: Self-pay | Admitting: General Surgery

## 2012-11-23 NOTE — Telephone Encounter (Signed)
Patient left VM X 2 today asking if her Xeloda has been ordered. Dr. Truett Perna prefers to wait until she is seen tomorrow since she has been having excess side effects from medication and may require another dose adjustment. Attempted to call her back, but line was busy.

## 2012-11-24 ENCOUNTER — Ambulatory Visit (HOSPITAL_BASED_OUTPATIENT_CLINIC_OR_DEPARTMENT_OTHER): Payer: Medicare Other | Admitting: Nurse Practitioner

## 2012-11-24 ENCOUNTER — Other Ambulatory Visit (HOSPITAL_BASED_OUTPATIENT_CLINIC_OR_DEPARTMENT_OTHER): Payer: Medicare Other | Admitting: Lab

## 2012-11-24 ENCOUNTER — Other Ambulatory Visit: Payer: Self-pay | Admitting: *Deleted

## 2012-11-24 ENCOUNTER — Telehealth: Payer: Self-pay | Admitting: Oncology

## 2012-11-24 VITALS — BP 125/74 | HR 60 | Temp 97.4°F | Resp 18 | Ht 60.0 in | Wt 120.8 lb

## 2012-11-24 DIAGNOSIS — Z853 Personal history of malignant neoplasm of breast: Secondary | ICD-10-CM

## 2012-11-24 DIAGNOSIS — C20 Malignant neoplasm of rectum: Secondary | ICD-10-CM

## 2012-11-24 DIAGNOSIS — C189 Malignant neoplasm of colon, unspecified: Secondary | ICD-10-CM

## 2012-11-24 LAB — COMPREHENSIVE METABOLIC PANEL (CC13)
ALT: 19 U/L (ref 0–55)
AST: 28 U/L (ref 5–34)
Alkaline Phosphatase: 48 U/L (ref 40–150)
Creatinine: 1 mg/dL (ref 0.6–1.1)
Total Bilirubin: 0.58 mg/dL (ref 0.20–1.20)

## 2012-11-24 LAB — CBC WITH DIFFERENTIAL/PLATELET
BASO%: 0.7 % (ref 0.0–2.0)
Basophils Absolute: 0 10*3/uL (ref 0.0–0.1)
EOS%: 1.5 % (ref 0.0–7.0)
HCT: 38.7 % (ref 34.8–46.6)
HGB: 13.5 g/dL (ref 11.6–15.9)
MCH: 33.7 pg (ref 25.1–34.0)
MCHC: 34.8 g/dL (ref 31.5–36.0)
MCV: 96.7 fL (ref 79.5–101.0)
MONO%: 9.6 % (ref 0.0–14.0)
NEUT%: 48.8 % (ref 38.4–76.8)

## 2012-11-24 MED ORDER — CAPECITABINE 500 MG PO TABS: ORAL_TABLET | ORAL | Status: DC

## 2012-11-24 NOTE — Telephone Encounter (Signed)
RECEIVED A FAX FROM [employer] CONCERNING A CONFIRMATION OF FACSIMILE RECEIPT FOR PT. REFERRAL. 

## 2012-11-24 NOTE — Telephone Encounter (Signed)
gv and printed pt appt schedule for Feb..the patient aware °

## 2012-11-24 NOTE — Progress Notes (Signed)
OFFICE PROGRESS NOTE  Interval history:  Marie Burch returns as scheduled. She completed cycle 4 Xeloda beginning 11/08/2012. She denies nausea/vomiting. No mouth sores. No diarrhea. The ulcer at the perineum has healed. Hands and feet with stable redness. The right foot was mildly sore several days ago. She denies foot pain today.   Objective: Blood pressure 125/74, pulse 60, temperature 97.4 F (36.3 C), temperature source Oral, resp. rate 18, height 5' (1.524 m), weight 120 lb 12.8 oz (54.795 kg).  Oropharynx is without thrush or ulceration. Lungs are clear. Regular cardiac rhythm. Abdomen is soft and nontender. No organomegaly. Extremities are without edema. Palms and soles with erythema. Small area of dry desquamation at the left heel medially.  Lab Results: Lab Results  Component Value Date   WBC 6.9 11/24/2012   HGB 13.5 11/24/2012   HCT 38.7 11/24/2012   MCV 96.7 11/24/2012   PLT 187 11/24/2012    Chemistry:    Chemistry      Component Value Date/Time   NA 144 11/24/2012 0925   NA 144 08/05/2012 0602   K 4.1 11/24/2012 0925   K 3.8 08/05/2012 0602   CL 102 11/24/2012 0925   CL 109 08/05/2012 0602   CO2 31* 11/24/2012 0925   CO2 28 08/05/2012 0602   BUN 11.0 11/24/2012 0925   BUN 6 08/05/2012 0602   CREATININE 1.0 11/24/2012 0925   CREATININE 0.81 08/05/2012 0602      Component Value Date/Time   CALCIUM 10.1 11/24/2012 0925   CALCIUM 8.5 08/05/2012 0602   ALKPHOS 48 11/24/2012 0925   ALKPHOS 56 07/27/2012 0900   AST 28 11/24/2012 0925   AST 29 07/27/2012 0900   ALT 19 11/24/2012 0925   ALT 22 07/27/2012 0900   BILITOT 0.58 11/24/2012 0925   BILITOT 0.3 07/27/2012 0900       Studies/Results: No results found.  Medications: I have reviewed the patient's current medications.  Assessment/Plan:  1. Stage III (T3 N1) moderately differentiated adenocarcinoma of the left colon status post laparoscopic left colectomy on 08/03/2012. Initiation of adjuvant Xeloda chemotherapy on  08/30/2012. Dose reduction beginning with cycle 3 due to hand-foot syndrome. She completed cycle 4 beginning 11/08/2012. 2. Hand-foot syndrome secondary to Xeloda. Improved. 3. Superficial ulceration at the perineum. Question if related to Xeloda. She reports the ulcer has healed. She continues followup with her gynecologist. 4. Elevated preoperative CEA. Normal on 08/23/2012. 5. Status post right nephrectomy in the 1960s for management of a "stone". 6. History of breast cancer. 7. History of coronary artery disease status post coronary artery bypass surgery in 2003. 8. Incomplete preoperative colonoscopy secondary to obstruction. 9. History of hypertension.  Disposition-Marie Burch has completed 4 cycles of adjuvant Xeloda. The symptoms of hand-foot syndrome appear stable on exam today. Plan to proceed with cycle 5 as scheduled on 11/29/2012. She will return for a followup visit in 3 weeks. She knows to discontinue Xeloda and contact the office with progression of hand-foot symptoms.   Plan reviewed with Dr. Truett Perna.  Lonna Cobb ANP/GNP-BC

## 2012-11-25 ENCOUNTER — Encounter (INDEPENDENT_AMBULATORY_CARE_PROVIDER_SITE_OTHER): Payer: Self-pay | Admitting: General Surgery

## 2012-11-27 ENCOUNTER — Encounter: Payer: Self-pay | Admitting: Oncology

## 2012-11-27 ENCOUNTER — Encounter (INDEPENDENT_AMBULATORY_CARE_PROVIDER_SITE_OTHER): Payer: Self-pay | Admitting: General Surgery

## 2012-11-29 ENCOUNTER — Telehealth: Payer: Self-pay | Admitting: *Deleted

## 2012-11-29 ENCOUNTER — Encounter: Payer: Self-pay | Admitting: Oncology

## 2012-11-29 NOTE — Progress Notes (Signed)
Left msg on vm for pt to return my call regarding email she wrote to [employer].

## 2012-11-30 NOTE — Telephone Encounter (Signed)
Rec'd fax confirmation that Xeloda has shipped in 11/29/12 for next day delivery.

## 2012-12-08 ENCOUNTER — Encounter: Payer: Self-pay | Admitting: Oncology

## 2012-12-15 ENCOUNTER — Ambulatory Visit (HOSPITAL_BASED_OUTPATIENT_CLINIC_OR_DEPARTMENT_OTHER): Payer: Medicare Other | Admitting: Nurse Practitioner

## 2012-12-15 ENCOUNTER — Telehealth: Payer: Self-pay | Admitting: Oncology

## 2012-12-15 ENCOUNTER — Other Ambulatory Visit (HOSPITAL_BASED_OUTPATIENT_CLINIC_OR_DEPARTMENT_OTHER): Payer: Medicare Other | Admitting: Lab

## 2012-12-15 ENCOUNTER — Other Ambulatory Visit: Payer: Self-pay | Admitting: *Deleted

## 2012-12-15 VITALS — BP 129/69 | HR 65 | Temp 97.3°F | Resp 18 | Ht 60.0 in | Wt 121.0 lb

## 2012-12-15 DIAGNOSIS — L270 Generalized skin eruption due to drugs and medicaments taken internally: Secondary | ICD-10-CM

## 2012-12-15 DIAGNOSIS — C189 Malignant neoplasm of colon, unspecified: Secondary | ICD-10-CM

## 2012-12-15 DIAGNOSIS — Z853 Personal history of malignant neoplasm of breast: Secondary | ICD-10-CM

## 2012-12-15 LAB — CBC WITH DIFFERENTIAL/PLATELET
BASO%: 1 % (ref 0.0–2.0)
HCT: 38.9 % (ref 34.8–46.6)
LYMPH%: 35.8 % (ref 14.0–49.7)
MCH: 35.1 pg — ABNORMAL HIGH (ref 25.1–34.0)
MCHC: 34.8 g/dL (ref 31.5–36.0)
MCV: 100.7 fL (ref 79.5–101.0)
MONO#: 0.7 10*3/uL (ref 0.1–0.9)
NEUT%: 53.1 % (ref 38.4–76.8)
Platelets: 177 10*3/uL (ref 145–400)
WBC: 7.8 10*3/uL (ref 3.9–10.3)

## 2012-12-15 LAB — COMPREHENSIVE METABOLIC PANEL (CC13)
ALT: 20 U/L (ref 0–55)
CO2: 30 mEq/L — ABNORMAL HIGH (ref 22–29)
Creatinine: 1 mg/dL (ref 0.6–1.1)
Glucose: 132 mg/dl — ABNORMAL HIGH (ref 70–99)
Total Bilirubin: 0.57 mg/dL (ref 0.20–1.20)

## 2012-12-15 NOTE — Progress Notes (Signed)
OFFICE PROGRESS NOTE  Interval history:  Marie Burch returns as scheduled. She completed cycle 5 Xeloda beginning 11/29/2012. She denies nausea/vomiting. No mouth sores. She had loose stools on day 1. She took Imodium and became constipated. She took MiraLAX for constipation. She had no further loose stools. Feet became red and painful on 12/10/2012 lasting for approximately 2 days. The pain has resolved. She continues to note redness of the hands and feet. No skin breakdown.   Objective: Blood pressure 129/69, pulse 65, temperature 97.3 F (36.3 C), temperature source Oral, resp. rate 18, height 5' (1.524 m), weight 121 lb (54.885 kg).  Oropharynx is without thrush or ulceration. Lungs are clear. Regular cardiac rhythm. Abdomen is soft and nontender. No organomegaly. Extremities are without edema. Palms and soles with erythema. Scattered areas of dry desquamation at the soles.  Lab Results: Lab Results  Component Value Date   WBC 7.8 12/15/2012   HGB 13.6 12/15/2012   HCT 38.9 12/15/2012   MCV 100.7 12/15/2012   PLT 177 12/15/2012    Chemistry:    Chemistry      Component Value Date/Time   NA 144 11/24/2012 0925   NA 144 08/05/2012 0602   K 4.1 11/24/2012 0925   K 3.8 08/05/2012 0602   CL 102 11/24/2012 0925   CL 109 08/05/2012 0602   CO2 31* 11/24/2012 0925   CO2 28 08/05/2012 0602   BUN 11.0 11/24/2012 0925   BUN 6 08/05/2012 0602   CREATININE 1.0 11/24/2012 0925   CREATININE 0.81 08/05/2012 0602      Component Value Date/Time   CALCIUM 10.1 11/24/2012 0925   CALCIUM 8.5 08/05/2012 0602   ALKPHOS 48 11/24/2012 0925   ALKPHOS 56 07/27/2012 0900   AST 28 11/24/2012 0925   AST 29 07/27/2012 0900   ALT 19 11/24/2012 0925   ALT 22 07/27/2012 0900   BILITOT 0.58 11/24/2012 0925   BILITOT 0.3 07/27/2012 0900       Studies/Results: No results found.  Medications: I have reviewed the patient's current medications.  Assessment/Plan:  1. Stage III (T3 N1) moderately differentiated adenocarcinoma  of the left colon status post laparoscopic left colectomy on 08/03/2012. Initiation of adjuvant Xeloda chemotherapy on 08/30/2012. Dose reduction beginning with cycle 3 due to hand-foot syndrome. She completed cycle 4 beginning 11/08/2012. She completed cycle 5 beginning 11/29/2012. 2. Hand-foot syndrome secondary to Xeloda. Stable. 3. Superficial ulceration at the perineum. Question if related to Xeloda. She reports the ulcer has healed. She continues followup with her gynecologist. 4. Elevated preoperative CEA. Normal on 08/23/2012. 5. Status post right nephrectomy in the 1960s for management of a "stone". 6. History of breast cancer. 7. History of coronary artery disease status post coronary artery bypass surgery in 2003. 8. Incomplete preoperative colonoscopy secondary to obstruction. 9. History of hypertension.  Disposition-Ms. Ottaway has completed 5 cycles of adjuvant Xeloda. Hand/foot redness is stable. The pain has resolved. Plan to proceed with cycle 6 as scheduled on 12/20/2012. She will return for a followup visit in 3 weeks. She will contact the office the interim with any problems. We specifically discussed progression of hand/foot symptoms.  Plan reviewed with Dr. Truett Perna.  Lonna Cobb ANP/GNP-BC

## 2012-12-15 NOTE — Telephone Encounter (Signed)
gv and printed Feb and March appt.Marland KitchenMarland Kitchen

## 2012-12-15 NOTE — Telephone Encounter (Signed)
Pt has refill available at [facility]. Left message on voicemail for pt to contact pharmacy.

## 2012-12-19 ENCOUNTER — Encounter: Payer: Self-pay | Admitting: *Deleted

## 2012-12-19 NOTE — Progress Notes (Signed)
RECEIVED A FAX FROM [facility] CONCERNING A CONFIRMATION OF PRESCRIPTION SHIPMENT FOR XELODA ON 12/16/12

## 2012-12-24 ENCOUNTER — Other Ambulatory Visit: Payer: Self-pay

## 2012-12-25 ENCOUNTER — Encounter: Payer: Self-pay | Admitting: Nurse Practitioner

## 2013-01-04 ENCOUNTER — Telehealth: Payer: Self-pay | Admitting: *Deleted

## 2013-01-04 ENCOUNTER — Other Ambulatory Visit: Payer: Self-pay | Admitting: *Deleted

## 2013-01-04 ENCOUNTER — Other Ambulatory Visit: Payer: Self-pay | Admitting: Oncology

## 2013-01-04 MED ORDER — CAPECITABINE 500 MG PO TABS: ORAL_TABLET | ORAL | Status: DC

## 2013-01-04 NOTE — Telephone Encounter (Signed)
[facility] faxed prescription refill request for Xeloda.  Request to MD for review.

## 2013-01-05 ENCOUNTER — Ambulatory Visit (HOSPITAL_BASED_OUTPATIENT_CLINIC_OR_DEPARTMENT_OTHER): Payer: Medicare Other | Admitting: Oncology

## 2013-01-05 ENCOUNTER — Other Ambulatory Visit (HOSPITAL_BASED_OUTPATIENT_CLINIC_OR_DEPARTMENT_OTHER): Payer: Medicare Other | Admitting: Lab

## 2013-01-05 VITALS — BP 124/72 | HR 66 | Temp 96.7°F | Resp 18 | Ht 60.0 in | Wt 121.4 lb

## 2013-01-05 DIAGNOSIS — C189 Malignant neoplasm of colon, unspecified: Secondary | ICD-10-CM

## 2013-01-05 DIAGNOSIS — Z853 Personal history of malignant neoplasm of breast: Secondary | ICD-10-CM

## 2013-01-05 LAB — COMPREHENSIVE METABOLIC PANEL (CC13)
AST: 26 U/L (ref 5–34)
Albumin: 3.5 g/dL (ref 3.5–5.0)
BUN: 10.2 mg/dL (ref 7.0–26.0)
CO2: 29 mEq/L (ref 22–29)
Calcium: 9.6 mg/dL (ref 8.4–10.4)
Chloride: 104 mEq/L (ref 98–107)
Potassium: 3.7 mEq/L (ref 3.5–5.1)

## 2013-01-05 LAB — CBC WITH DIFFERENTIAL/PLATELET
Basophils Absolute: 0.1 10*3/uL (ref 0.0–0.1)
EOS%: 5.2 % (ref 0.0–7.0)
Eosinophils Absolute: 0.3 10*3/uL (ref 0.0–0.5)
HGB: 12.8 g/dL (ref 11.6–15.9)
MONO#: 0.6 10*3/uL (ref 0.1–0.9)
NEUT#: 3 10*3/uL (ref 1.5–6.5)
RDW: 21.3 % — ABNORMAL HIGH (ref 11.2–14.5)
WBC: 6.2 10*3/uL (ref 3.9–10.3)
lymph#: 2.3 10*3/uL (ref 0.9–3.3)

## 2013-01-05 NOTE — Progress Notes (Signed)
   [facility]    OFFICE PROGRESS NOTE   INTERVAL HISTORY:   She returns as scheduled. She completed another cycle of Xeloda beginning on 12/20/2012. No mouth sores, nausea, or diarrhea. Hand/foot erythema persist. No pain.  Objective:  Vital signs in last 24 hours:  Blood pressure 124/72, pulse 66, temperature 96.7 F (35.9 C), temperature source Oral, resp. rate 18, height 5' (1.524 m), weight 121 lb 6.4 oz (55.067 kg).    HEENT: No thrush or ulcers Resp: Lungs clear bilaterally Cardio: Regular rate and rhythm GI: Nontender, no hepatomegaly Vascular: No leg edema  Skin: Erythema and mild skin thickening at the palms. Erythema and mild superficial desquamation over the soles. No discrete ulcers.     Lab Results:  Lab Results  Component Value Date   WBC 6.2 01/05/2013   HGB 12.8 01/05/2013   HCT 38.5 01/05/2013   MCV 103.2* 01/05/2013   PLT 178 01/05/2013   ANC 3.0   Medications: I have reviewed the patient's current medications.  Assessment/Plan: 1. Stage III (T3 N1) moderately differentiated adenocarcinoma of the left colon status post laparoscopic left colectomy on 08/03/2012. Initiation of adjuvant Xeloda chemotherapy on 08/30/2012. Dose reduction beginning with cycle 3 due to hand-foot syndrome. She completed cycle 4 beginning 11/08/2012. She completed cycle 5 beginning 11/29/2012. She completed cycle 6 beginning on 12/20/2012. 2. Hand-foot syndrome secondary to Xeloda. Stable. 3. Superficial ulceration at the perineum. Question if related to Xeloda. She reports the ulcer has healed. She continues followup with her gynecologist. 4. Elevated preoperative CEA. Normal on 08/23/2012. 5. Status post right nephrectomy in the 1960s for management of a "stone". 6. History of breast cancer. 7. History of coronary artery disease status post coronary artery bypass surgery in 2003. 8. Incomplete preoperative colonoscopy secondary to obstruction. 9. History of  hypertension.    Disposition:  She appears stable. She has completed 6 cycles of adjuvant Xeloda. The plan is to begin cycle 7 on 01/10/2013. She will contact us if the hand/foot erythema has not improved prior to 01/10/2013.  Ms. Thumma will return for an office visit 01/26/2013.   Thornton Papas, MD  01/05/2013  4:21 PM

## 2013-01-06 ENCOUNTER — Other Ambulatory Visit: Payer: Self-pay | Admitting: *Deleted

## 2013-01-06 ENCOUNTER — Telehealth: Payer: Self-pay | Admitting: Oncology

## 2013-01-06 NOTE — Telephone Encounter (Signed)
Talked to patient and gave her new appt time on March 20th lab and ML

## 2013-01-07 ENCOUNTER — Encounter: Payer: Self-pay | Admitting: Nurse Practitioner

## 2013-01-09 ENCOUNTER — Telehealth: Payer: Self-pay | Admitting: *Deleted

## 2013-01-09 NOTE — Telephone Encounter (Signed)
Called patient and explained the appointments on March 20th are for lab at 9:45 am and provider at 10:15 am.  Explained she needs to be here at the earliest time of 9:45 am.  Verbalized understanding.

## 2013-01-10 NOTE — Telephone Encounter (Signed)
[facility] faxed confirmation of Xeloda prescription shipment.  Shipped on 01-09-2013 with next business day delivery. 

## 2013-01-16 ENCOUNTER — Telehealth: Payer: Self-pay | Admitting: *Deleted

## 2013-01-16 ENCOUNTER — Encounter: Payer: Self-pay | Admitting: Nurse Practitioner

## 2013-01-16 NOTE — Telephone Encounter (Signed)
Call from pt reporting feet are "red and sore." Began Cycle 7 Xeloda on 01/10/13. Taking 1,000 mg BID. Painful to walk. Pt reports she did not take today's dose of Xeloda. Hands are red but not as tender. She continues moisturizer to hands and feet. Pt understands to avoid extreme temperatures and high impact/ repetitive activity to hands and feet. Reviewed with Dr. Truett Perna: Order received to have pt HOLD Xeloda. Follow up as scheduled. Pt voiced understanding. Next appt confirmed.

## 2013-01-26 ENCOUNTER — Ambulatory Visit (HOSPITAL_BASED_OUTPATIENT_CLINIC_OR_DEPARTMENT_OTHER): Payer: Medicare Other | Admitting: Nurse Practitioner

## 2013-01-26 ENCOUNTER — Telehealth: Payer: Self-pay | Admitting: Nurse Practitioner

## 2013-01-26 ENCOUNTER — Other Ambulatory Visit (HOSPITAL_BASED_OUTPATIENT_CLINIC_OR_DEPARTMENT_OTHER): Payer: Medicare Other | Admitting: Lab

## 2013-01-26 ENCOUNTER — Telehealth: Payer: Self-pay | Admitting: Oncology

## 2013-01-26 VITALS — BP 154/83 | HR 60 | Temp 96.7°F | Resp 18 | Ht 60.0 in | Wt 121.1 lb

## 2013-01-26 LAB — COMPREHENSIVE METABOLIC PANEL (CC13)
ALT: 27 U/L (ref 0–55)
AST: 33 U/L (ref 5–34)
Albumin: 3.4 g/dL — ABNORMAL LOW (ref 3.5–5.0)
Calcium: 10.1 mg/dL (ref 8.4–10.4)
Chloride: 103 mEq/L (ref 98–107)
Potassium: 4 mEq/L (ref 3.5–5.1)
Total Protein: 6.9 g/dL (ref 6.4–8.3)

## 2013-01-26 LAB — CBC WITH DIFFERENTIAL/PLATELET
BASO%: 1.2 % (ref 0.0–2.0)
Basophils Absolute: 0.1 10*3/uL (ref 0.0–0.1)
EOS%: 9.6 % — ABNORMAL HIGH (ref 0.0–7.0)
HGB: 14.1 g/dL (ref 11.6–15.9)
MCH: 35.3 pg — ABNORMAL HIGH (ref 25.1–34.0)
MCHC: 34.3 g/dL (ref 31.5–36.0)
RDW: 19.1 % — ABNORMAL HIGH (ref 11.2–14.5)
lymph#: 2.6 10*3/uL (ref 0.9–3.3)

## 2013-01-26 NOTE — Progress Notes (Signed)
OFFICE PROGRESS NOTE  Interval history:  Marie Burch returns as scheduled. She began cycle 7 of adjuvant Xeloda beginning 01/10/2013. She contacted the office on 01/16/2013 to report bilateral foot redness and pain. She was instructed to hold further Xeloda.  She has significant bilateral foot pain with some difficulty walking. The feet became fairly red. She noted some redness on her hands as well but did not have hand pain. The foot redness and pain are better. She denies diarrhea. No mouth sores. No nausea or vomiting.  She recently noted a new "lump" in the right breast located in the upper outer quadrant near a lumpectomy scar. She thinks she is overdue for her annual mammogram.   Objective: Blood pressure 154/83, pulse 60, temperature 96.7 F (35.9 C), temperature source Oral, resp. rate 18, height 5' (1.524 m), weight 121 lb 1.6 oz (54.931 kg).  Oropharynx is without thrush or ulceration. No palpable cervical, supraclavicular or axillary lymph nodes. Status post right lumpectomy. No mass palpated in the area of concern. Lungs are clear. Regular cardiac rhythm. Abdomen soft and nontender. No hepatomegaly. Extremities are without edema. Palms and soles with erythema. Dry desquamation at the soles.  Lab Results: Lab Results  Component Value Date   WBC 7.7 01/26/2013   HGB 14.1 01/26/2013   HCT 41.0 01/26/2013   MCV 102.9* 01/26/2013   PLT 159 01/26/2013    Chemistry:    Chemistry      Component Value Date/Time   NA 145 01/26/2013 0932   NA 144 08/05/2012 0602   K 4.0 01/26/2013 0932   K 3.8 08/05/2012 0602   CL 103 01/26/2013 0932   CL 109 08/05/2012 0602   CO2 32* 01/26/2013 0932   CO2 28 08/05/2012 0602   BUN 12.8 01/26/2013 0932   BUN 6 08/05/2012 0602   CREATININE 0.9 01/26/2013 0932   CREATININE 0.81 08/05/2012 0602      Component Value Date/Time   CALCIUM 10.1 01/26/2013 0932   CALCIUM 8.5 08/05/2012 0602   ALKPHOS 58 01/26/2013 0932   ALKPHOS 56 07/27/2012 0900   AST 33 01/26/2013  0932   AST 29 07/27/2012 0900   ALT 27 01/26/2013 0932   ALT 22 07/27/2012 0900   BILITOT 0.51 01/26/2013 0932   BILITOT 0.3 07/27/2012 0900       Studies/Results: No results found.  Medications: I have reviewed the patient's current medications.  Assessment/Plan:  1. Stage III (T3 N1) moderately differentiated adenocarcinoma of the left colon status post laparoscopic left colectomy on 08/03/2012. Initiation of adjuvant Xeloda chemotherapy on 08/30/2012. Dose reduction beginning with cycle 3 due to hand-foot syndrome. She completed cycle 4 beginning 11/08/2012. She completed cycle 5 beginning 11/29/2012. She completed cycle 6 beginning on 12/20/2012. She began cycle 7 on 01/10/2013. Cycle 7 was prematurely discontinued on 01/16/2013 due to progressive hand-foot symptoms. 2. Hand-foot syndrome secondary to Xeloda. Increased during cycle 7 with redness and pain at the feet. 3. Superficial ulceration at the perineum. Question if related to Xeloda. She reports the ulcer has healed. She continues followup with her gynecologist. 4. Elevated preoperative CEA. Normal on 08/23/2012. 5. Status post right nephrectomy in the 1960s for management of a "stone". 6. History of breast cancer. 7. History of coronary artery disease status post coronary artery bypass surgery in 2003. 8. Incomplete preoperative colonoscopy secondary to obstruction. 9. History of hypertension. 10. Patient detected "lump" right breast. She is being referred for a diagnostic mammogram.  Disposition-she has completed 7 cycles of adjuvant  Xeloda. Cycle 7 was prematurely discontinued due to progressive symptoms of hand-foot syndrome. She is due to begin cycle 8 on 01/31/2013. I will discuss the timing of cycle 8 as well as a possible dose reduction with Dr. Truett Perna upon his return to the office and contact Marie Burch with instructions. As noted above she is being referred for a diagnostic mammogram. She will return for a followup visit  in 3 weeks.  Lonna Cobb ANP/GNP-BC

## 2013-01-26 NOTE — Telephone Encounter (Signed)
gv and printed appt schedule for pt Marie Burch...schedule pt for mammo with [facility] for Marie Burch 3td @ 9:15

## 2013-01-26 NOTE — Telephone Encounter (Signed)
After talking with Dr. Truett Perna I contacted Marie Burch with instructions to begin the next cycle of Xeloda as scheduled on 01/31/2013 with a dose reduction of 1000 mg in the morning and 500 mg in the evening for 14 days. We will see her back as scheduled on 02/16/2013. She understands to stop the medication and call the office with recurrent hand/foot symptoms.

## 2013-01-27 ENCOUNTER — Encounter: Payer: Self-pay | Admitting: *Deleted

## 2013-01-27 NOTE — Progress Notes (Signed)
RECEIVED A FAX FROM [facility] CONCERNING A CONFIRMATION OF PRESCRIPTION SHIPMENT FOR XELODA ON 01/26/13. 

## 2013-02-13 ENCOUNTER — Other Ambulatory Visit: Payer: Self-pay | Admitting: *Deleted

## 2013-02-13 DIAGNOSIS — C20 Malignant neoplasm of rectum: Secondary | ICD-10-CM

## 2013-02-13 NOTE — Telephone Encounter (Signed)
THIS REFILL REQUEST FOR XELODA WAS GIVEN TO DR.SHERRILL'S NURSE, SUSAN COWARD,RN. 

## 2013-02-14 MED ORDER — CAPECITABINE 500 MG PO TABS: ORAL_TABLET | ORAL | Status: DC

## 2013-02-14 NOTE — Addendum Note (Signed)
Addended by: Wandalee Ferdinand on: 02/14/2013 06:23 PM   Modules accepted: Orders

## 2013-02-15 NOTE — Telephone Encounter (Signed)
Rec'd fax confirmation of new Xeloda Rx faxed in 02/14/13

## 2013-02-16 ENCOUNTER — Telehealth: Payer: Self-pay | Admitting: Oncology

## 2013-02-16 ENCOUNTER — Telehealth: Payer: Self-pay | Admitting: *Deleted

## 2013-02-16 ENCOUNTER — Ambulatory Visit (HOSPITAL_BASED_OUTPATIENT_CLINIC_OR_DEPARTMENT_OTHER): Payer: Medicare Other | Admitting: Nurse Practitioner

## 2013-02-16 ENCOUNTER — Other Ambulatory Visit (HOSPITAL_BASED_OUTPATIENT_CLINIC_OR_DEPARTMENT_OTHER): Payer: Medicare Other | Admitting: Lab

## 2013-02-16 VITALS — BP 113/71 | HR 64 | Temp 97.0°F | Resp 18 | Ht 60.0 in | Wt 124.0 lb

## 2013-02-16 DIAGNOSIS — G609 Hereditary and idiopathic neuropathy, unspecified: Secondary | ICD-10-CM

## 2013-02-16 DIAGNOSIS — C189 Malignant neoplasm of colon, unspecified: Secondary | ICD-10-CM

## 2013-02-16 DIAGNOSIS — T451X5A Adverse effect of antineoplastic and immunosuppressive drugs, initial encounter: Secondary | ICD-10-CM

## 2013-02-16 LAB — COMPREHENSIVE METABOLIC PANEL (CC13)
BUN: 15.6 mg/dL (ref 7.0–26.0)
CO2: 30 mEq/L — ABNORMAL HIGH (ref 22–29)
Calcium: 9.7 mg/dL (ref 8.4–10.4)
Chloride: 103 mEq/L (ref 98–107)
Creatinine: 1 mg/dL (ref 0.6–1.1)
Total Bilirubin: 0.66 mg/dL (ref 0.20–1.20)

## 2013-02-16 LAB — CBC WITH DIFFERENTIAL/PLATELET
BASO%: 0.8 % (ref 0.0–2.0)
Eosinophils Absolute: 0.2 10*3/uL (ref 0.0–0.5)
HCT: 39.4 % (ref 34.8–46.6)
LYMPH%: 42.4 % (ref 14.0–49.7)
MCHC: 34 g/dL (ref 31.5–36.0)
MCV: 100.8 fL (ref 79.5–101.0)
MONO#: 0.6 10*3/uL (ref 0.1–0.9)
MONO%: 9.7 % (ref 0.0–14.0)
NEUT%: 43.3 % (ref 38.4–76.8)
Platelets: 189 10*3/uL (ref 145–400)
WBC: 6.3 10*3/uL (ref 3.9–10.3)

## 2013-02-16 NOTE — Progress Notes (Signed)
OFFICE PROGRESS NOTE  Interval history:  Marie Burch returns as scheduled. She completed cycle 8 adjuvant Xeloda beginning 01/31/2013 at a reduced dose due to hand foot syndrome with previous cycles. She completed cycle 8 without difficulty. She noted very mild redness and discomfort over the hands. No mouth sores. No diarrhea.  Following her last visit on 01/26/2013 she was referred for a diagnostic mammogram to evaluate a lump she had detected in the right breast. The mammogram done on 02/09/2013 showed a 7 mm rounded dystrophic calcification in the area of the previous lumpectomy site in the posterior lateral upper aspect of the right breast. Right breast ultrasound showed a 5.4 x 5.4 mm mass with suggested calcification in the wall of the mass and shadowing noted. The radiologist commented it would be consistent with the dystrophic calcification noted on the mammogram. Return to screening mammogram schedule was recommended.    Objective: Blood pressure 113/71, pulse 64, temperature 97 F (36.1 C), temperature source Oral, resp. rate 18, height 5' (1.524 m), weight 124 lb (56.246 kg).  No thrush or ulceration. Lungs are clear. Regular cardiac rhythm. Abdomen soft and nontender. No hepatomegaly. Extremities are without edema. Palms with mild skin thickening, erythema. No skin breakdown.  Lab Results: Lab Results  Component Value Date   WBC 6.3 02/16/2013   HGB 13.4 02/16/2013   HCT 39.4 02/16/2013   MCV 100.8 02/16/2013   PLT 189 02/16/2013    Chemistry:    Chemistry      Component Value Date/Time   NA 143 02/16/2013 1049   NA 144 08/05/2012 0602   K 4.0 02/16/2013 1049   K 3.8 08/05/2012 0602   CL 103 02/16/2013 1049   CL 109 08/05/2012 0602   CO2 30* 02/16/2013 1049   CO2 28 08/05/2012 0602   BUN 15.6 02/16/2013 1049   BUN 6 08/05/2012 0602   CREATININE 1.0 02/16/2013 1049   CREATININE 0.81 08/05/2012 0602      Component Value Date/Time   CALCIUM 9.7 02/16/2013 1049   CALCIUM 8.5 08/05/2012  0602   ALKPHOS 47 02/16/2013 1049   ALKPHOS 56 07/27/2012 0900   AST 28 02/16/2013 1049   AST 29 07/27/2012 0900   ALT 21 02/16/2013 1049   ALT 22 07/27/2012 0900   BILITOT 0.66 02/16/2013 1049   BILITOT 0.3 07/27/2012 0900       Studies/Results: No results found.  Medications: I have reviewed the patient's current medications.  Assessment/Plan:  1. Stage III (T3 N1) moderately differentiated adenocarcinoma of the left colon status post laparoscopic left colectomy on 08/03/2012. Initiation of adjuvant Xeloda chemotherapy on 08/30/2012. Dose reduction beginning with cycle 3 due to hand-foot syndrome. She completed cycle 4 beginning 11/08/2012. She completed cycle 5 beginning 11/29/2012. She completed cycle 6 beginning on 12/20/2012. She began cycle 7 on 01/10/2013. Cycle 7 was prematurely discontinued on 01/16/2013 due to progressive hand-foot symptoms. She completed the eighth and final cycle of adjuvant Xeloda beginning 01/31/2013. Xeloda was dose reduced with cycle 8. 2. Hand-foot syndrome secondary to Xeloda. Increased during cycle 7 with redness and pain at the feet. Xeloda was dose reduced with cycle 8 with improvement in the symptoms of hand-foot syndrome. 3. Superficial ulceration at the perineum. Question if related to Xeloda. She reports the ulcer has healed. She continues followup with her gynecologist. 4. Elevated preoperative CEA. Normal on 08/23/2012. 5. Status post right nephrectomy in the 1960s for management of a "stone". 6. History of breast cancer. 7. History of coronary  artery disease status post coronary artery bypass surgery in 2003. 8. Incomplete preoperative colonoscopy secondary to obstruction. 9. History of hypertension. 10. Patient detected "lump" right breast reported when here on 01/26/2013. She is status post mammogram and right breast ultrasound 02/09/2013 with the palpable lump correlating with the dystrophic calcification in the right breast in the upper outer  quadrant.  Disposition-Ms. Choung has completed the course of adjuvant Xeloda chemotherapy. We will followup on the CEA from today. She will return for a followup visit and CEA in 6 months. She will contact Dr. Henriette Combs office to schedule a colonoscopy in August/September 2014.  Plan reviewed with Dr. Truett Perna.  Lonna Cobb ANP/GNP-BC

## 2013-02-16 NOTE — Telephone Encounter (Signed)
Called to confirm that patient has completed her Xeloda therapy and does not require refill.

## 2013-02-17 ENCOUNTER — Encounter (INDEPENDENT_AMBULATORY_CARE_PROVIDER_SITE_OTHER): Payer: Self-pay | Admitting: General Surgery

## 2013-02-17 ENCOUNTER — Ambulatory Visit (INDEPENDENT_AMBULATORY_CARE_PROVIDER_SITE_OTHER): Payer: BC Managed Care – HMO | Admitting: General Surgery

## 2013-02-17 VITALS — BP 132/64 | HR 74 | Resp 18 | Ht 60.0 in | Wt 124.0 lb

## 2013-02-17 DIAGNOSIS — Z85038 Personal history of other malignant neoplasm of large intestine: Secondary | ICD-10-CM

## 2013-02-17 NOTE — Progress Notes (Signed)
Subjective:     Patient ID: Marie Burch, female   DOB: 09-07-1934, 79 y.o.   MRN: [id number]  HPI This patient follows up status post laparoscopic partial colectomy back in September of 2013 for stage III colon cancer. She has been under the care of Dr. Truett Perna for postoperative chemotherapy and she comes in today doing very well. She has no complaints. She says her bowels are functioning normally and has noticed no change in her bowels since her surgery. She denies any blood in the stools or melena. She recently noticed a right breast lump in the area of her prior lumpectomy scar but followup mammogram demonstrated BI-RADS 2 mammograms without any suspicious changes.  Review of Systems     Objective:   Physical Exam She is in no acute distress and nontoxic-appearing abdomen soft nontender exam without any palpable masses her incisions are well-healed without any evidence of hernia    Assessment:     History of stage III colon cancer-doing well She is doing very well from her procedure. She has no symptoms and no evidence of recurrent disease. Her recent CEA level was normal. A recent mammogram was normal as well. I recommended that she obtain a colonoscopy in August to September time frames and we will see her back at that time or sooner as needed     Plan:     Colonoscopy in August and followup in September or October

## 2013-02-20 ENCOUNTER — Telehealth: Payer: Self-pay | Admitting: *Deleted

## 2013-02-20 NOTE — Telephone Encounter (Signed)
Message copied by Wandalee Ferdinand on Mon Feb 20, 2013  9:32 AM ------      Message from: Ladene Artist      Created: Fri Feb 17, 2013  6:05 PM       Please call patient, cea is normal ------

## 2013-02-20 NOTE — Telephone Encounter (Signed)
Notified patient of normal CEA.

## 2013-06-05 ENCOUNTER — Telehealth: Payer: Self-pay | Admitting: *Deleted

## 2013-06-05 ENCOUNTER — Encounter (INDEPENDENT_AMBULATORY_CARE_PROVIDER_SITE_OTHER): Payer: Self-pay | Admitting: General Surgery

## 2013-06-05 NOTE — Telephone Encounter (Signed)
Patient called she had received letter from [employer] asking if she would need any more Xeloda? Informed her that her chemotherapy is completed and she is in remission and that she does not need chemo any longer. Will continue to monitor her by lab work and physical exams and scheduled colonosocopy. She will alert company they can close her case.

## 2013-06-06 ENCOUNTER — Encounter: Payer: Self-pay | Admitting: Oncology

## 2013-06-06 NOTE — Progress Notes (Signed)
Received 60 day letter from [employer].  Wanted to know if pt still needed assistance with Xeloda.  Per Darl Pikes (nurse) pt is done with chemotherapy.  I faxed letter back stating that pt no longer needs assistance with her CRC-MCR therapy.

## 2013-06-14 ENCOUNTER — Other Ambulatory Visit: Payer: Self-pay

## 2013-07-13 ENCOUNTER — Encounter (HOSPITAL_COMMUNITY): Payer: Self-pay | Admitting: *Deleted

## 2013-07-20 ENCOUNTER — Encounter (HOSPITAL_COMMUNITY): Payer: Self-pay | Admitting: Pharmacy Technician

## 2013-07-24 ENCOUNTER — Other Ambulatory Visit: Payer: Self-pay | Admitting: Gastroenterology

## 2013-08-08 ENCOUNTER — Encounter (HOSPITAL_COMMUNITY): Payer: Self-pay | Admitting: Anesthesiology

## 2013-08-08 ENCOUNTER — Encounter (HOSPITAL_COMMUNITY)
Admission: RE | Disposition: A | Discharge status: Home / Self Care | Payer: Self-pay | Source: Ambulatory Visit | Attending: Gastroenterology

## 2013-08-08 ENCOUNTER — Ambulatory Visit (HOSPITAL_COMMUNITY): Payer: Medicare Other | Admitting: Anesthesiology

## 2013-08-08 ENCOUNTER — Ambulatory Visit (HOSPITAL_COMMUNITY)
Admission: RE | Admit: 2013-08-08 | Discharge: 2013-08-08 | Disposition: A | Discharge status: Home / Self Care | Payer: Medicare Other | Source: Ambulatory Visit | Attending: Gastroenterology | Admitting: Gastroenterology

## 2013-08-08 DIAGNOSIS — Z951 Presence of aortocoronary bypass graft: Secondary | ICD-10-CM | POA: Insufficient documentation

## 2013-08-08 DIAGNOSIS — E7800 Pure hypercholesterolemia, unspecified: Secondary | ICD-10-CM | POA: Insufficient documentation

## 2013-08-08 DIAGNOSIS — I2510 Atherosclerotic heart disease of native coronary artery without angina pectoris: Secondary | ICD-10-CM | POA: Insufficient documentation

## 2013-08-08 DIAGNOSIS — I10 Essential (primary) hypertension: Secondary | ICD-10-CM | POA: Insufficient documentation

## 2013-08-08 DIAGNOSIS — Z853 Personal history of malignant neoplasm of breast: Secondary | ICD-10-CM | POA: Insufficient documentation

## 2013-08-08 DIAGNOSIS — Z905 Acquired absence of kidney: Secondary | ICD-10-CM | POA: Insufficient documentation

## 2013-08-08 DIAGNOSIS — K5730 Diverticulosis of large intestine without perforation or abscess without bleeding: Secondary | ICD-10-CM | POA: Insufficient documentation

## 2013-08-08 DIAGNOSIS — Z85038 Personal history of other malignant neoplasm of large intestine: Secondary | ICD-10-CM | POA: Insufficient documentation

## 2013-08-08 DIAGNOSIS — Z1211 Encounter for screening for malignant neoplasm of colon: Secondary | ICD-10-CM | POA: Insufficient documentation

## 2013-08-08 HISTORY — PX: COLONOSCOPY WITH PROPOFOL: SHX5780

## 2013-08-08 HISTORY — DX: Atherosclerotic heart disease of native coronary artery without angina pectoris: I25.10

## 2013-08-08 SURGERY — COLONOSCOPY WITH PROPOFOL
Anesthesia: Monitor Anesthesia Care

## 2013-08-08 MED ORDER — LACTATED RINGERS IV SOLN
INTRAVENOUS | Status: DC
  Administered 2013-08-08: 12:00:00 via INTRAVENOUS

## 2013-08-08 MED ORDER — MIDAZOLAM HCL 5 MG/5ML IJ SOLN
INTRAMUSCULAR | Status: DC | PRN
  Administered 2013-08-08: 2 mg via INTRAVENOUS

## 2013-08-08 MED ORDER — SODIUM CHLORIDE 0.9 % IV SOLN: INTRAVENOUS | Status: DC

## 2013-08-08 MED ORDER — KETAMINE HCL 50 MG/ML IJ SOLN
INTRAMUSCULAR | Status: DC | PRN
  Administered 2013-08-08: 25 mg via INTRAMUSCULAR

## 2013-08-08 MED ORDER — PROPOFOL INFUSION 10 MG/ML OPTIME
INTRAVENOUS | Status: DC | PRN
  Administered 2013-08-08: 50 ug/kg/min via INTRAVENOUS

## 2013-08-08 SURGICAL SUPPLY — 21 items

## 2013-08-08 NOTE — Anesthesia Postprocedure Evaluation (Signed)
  Anesthesia Post-op Note  Patient: Marie Burch  Procedure(s) Performed: Procedure(s) (LRB): COLONOSCOPY WITH PROPOFOL (N/A)  Patient Location: PACU  Anesthesia Type: MAC  Level of Consciousness: awake and alert   Airway and Oxygen Therapy: Patient Spontanous Breathing  Post-op Pain: mild  Post-op Assessment: Post-op Vital signs reviewed, Patient's Cardiovascular Status Stable, Respiratory Function Stable, Patent Airway and No signs of Nausea or vomiting  Last Vitals:  Filed Vitals:   08/08/13 1220  BP: 124/69  Pulse:   Temp:   Resp: 9    Post-op Vital Signs: stable   Complications: No apparent anesthesia complications

## 2013-08-08 NOTE — Op Note (Signed)
Problem: Sigmoid colon cancer surgery in October 2013  Endoscopist: Danise Edge  Premedication: Propofol administered by anesthesia  Procedure: Surveillance colonoscopy Anal inspection and digital rectal exam were normal. The Pentax pediatric colonoscope was introduced into the rectum and easily advanced to the cecum. A normal-appearing ileocecal valve and appendiceal orifice were identified. Colonic preparation for the exam today was good.  Universal colonic diverticulosis was present.  Rectum. Normal. Retroflex view of the distal rectum normal.  Sigmoid colon and descending colon. Normal. Surgical anastomosis appears normal and patent.  Splenic flexure. Normal.  Transverse colon. Normal.  Hepatic flexure. Normal.  Ascending colon normal.   Cecum and ileocecal valve. Normal.  Assessment:  #1. Normal surveillance proctocolonoscopy to the cecum following sigmoid colon cancer surgery in October 2013  #2. Universal colonic diverticulosis  Recommendations: Schedule repeat surveillance colonoscopy in approximately 3 years if the patient's health allows

## 2013-08-08 NOTE — Transfer of Care (Signed)
Immediate Anesthesia Transfer of Care Note  Patient: Marie Burch  Procedure(s) Performed: Procedure(s): COLONOSCOPY WITH PROPOFOL (N/A)  Patient Location: PACU  Anesthesia Type:MAC  Level of Consciousness: sedated  Airway & Oxygen Therapy: Patient Spontanous Breathing and Patient connected to nasal cannula oxygen  Post-op Assessment: Report given to PACU RN and Post -op Vital signs reviewed and stable  Post vital signs: Reviewed and stable  Complications: No apparent anesthesia complications

## 2013-08-08 NOTE — Anesthesia Preprocedure Evaluation (Signed)
Anesthesia Evaluation  Patient identified by MRN, date of birth, ID band Patient awake    Reviewed: Allergy & Precautions, H&P , NPO status , Patient's Chart, lab work & pertinent test results  Airway Mallampati: II TM Distance: >3 FB Neck ROM: Full    Dental no notable dental hx.    Pulmonary neg pulmonary ROS,  breath sounds clear to auscultation  Pulmonary exam normal       Cardiovascular hypertension, Pt. on home beta blockers and Pt. on medications + CAD negative cardio ROS  Rhythm:Regular Rate:Normal     Neuro/Psych PSYCHIATRIC DISORDERS Anxiety Depression negative neurological ROS     GI/Hepatic Neg liver ROS, GERD-  ,  Endo/Other  negative endocrine ROS  Renal/GU Renal disease  negative genitourinary   Musculoskeletal negative musculoskeletal ROS (+)   Abdominal   Peds negative pediatric ROS (+)  Hematology negative hematology ROS (+)   Anesthesia Other Findings   Reproductive/Obstetrics negative OB ROS                           Anesthesia Physical Anesthesia Plan  ASA: III  Anesthesia Plan: MAC   Post-op Pain Management:    Induction: Intravenous  Airway Management Planned:   Additional Equipment:   Intra-op Plan:   Post-operative Plan:   Informed Consent: I have reviewed the patients History and Physical, chart, labs and discussed the procedure including the risks, benefits and alternatives for the proposed anesthesia with the patient or authorized representative who has indicated his/her understanding and acceptance.   Dental advisory given  Plan Discussed with: CRNA  Anesthesia Plan Comments:         Anesthesia Quick Evaluation

## 2013-08-08 NOTE — H&P (Signed)
  Procedure: Surveillance colonoscopy following sigmoid colon cancer surgery  History: The patient is a 79 year old female born [birth year]. On 08/09/2012, the patient underwent sigmoid colon cancer surgery. She is scheduled to undergo a surveillance colonoscopy today.  Past medical history: Coronary artery disease. Coronary artery bypass grafting. Mitral valve repair surgery. Hypertension. Hypercholesterolemia. Breast cancer. Colonic diverticulosis. Depression. Lumbar spondylosis. Nephrolithiasis with nephrectomy. Fibrous tumor around the eye surgically removed.    Exam: The patient is alert and lying comfortably on the endoscopy stretcher. Abdomen is soft and nontender to palpation. Lungs are clear to auscultation. Cardiac exam reveals a regular rhythm.  Plan: Proceed with surveillance colonoscopy following sigmoid colon cancer surgery in October 2013

## 2013-08-09 ENCOUNTER — Telehealth (INDEPENDENT_AMBULATORY_CARE_PROVIDER_SITE_OTHER): Payer: Self-pay

## 2013-08-09 ENCOUNTER — Encounter (HOSPITAL_COMMUNITY): Payer: Self-pay | Admitting: Gastroenterology

## 2013-08-09 NOTE — Telephone Encounter (Signed)
Patient states she had a negative colonoscopy with DR. Laural Benes, she is asking should she keep her appointment 08-24-13 with Dr. Biagio Quint. Please advise

## 2013-08-17 ENCOUNTER — Encounter (INDEPENDENT_AMBULATORY_CARE_PROVIDER_SITE_OTHER): Payer: Self-pay | Admitting: General Surgery

## 2013-08-17 ENCOUNTER — Other Ambulatory Visit (HOSPITAL_BASED_OUTPATIENT_CLINIC_OR_DEPARTMENT_OTHER): Payer: Medicare Other | Admitting: Lab

## 2013-08-17 ENCOUNTER — Ambulatory Visit (HOSPITAL_BASED_OUTPATIENT_CLINIC_OR_DEPARTMENT_OTHER): Payer: Medicare Other | Admitting: Oncology

## 2013-08-17 ENCOUNTER — Telehealth: Payer: Self-pay | Admitting: Oncology

## 2013-08-17 VITALS — BP 155/89 | HR 53 | Temp 96.8°F | Resp 18 | Ht 60.0 in | Wt 125.9 lb

## 2013-08-17 DIAGNOSIS — Z853 Personal history of malignant neoplasm of breast: Secondary | ICD-10-CM

## 2013-08-17 DIAGNOSIS — C189 Malignant neoplasm of colon, unspecified: Secondary | ICD-10-CM

## 2013-08-17 LAB — CEA: CEA: 0.8 ng/mL (ref 0.0–5.0)

## 2013-08-17 NOTE — Progress Notes (Signed)
   [facility]    OFFICE PROGRESS NOTE   INTERVAL HISTORY:   She returns as scheduled. He feels well. Good appetite and energy level. Occasional discomfort in the right abdomen. No consistent pain. She underwent a colonoscopy on 08/08/2013. Universal diverticulosis. The surgical anastomosis appeared normal. The colonoscopy was otherwise normal. She reports recent difficulty with short-term memory loss.  Objective:  Vital signs in last 24 hours:  Blood pressure 155/89, pulse 53, temperature 96.8 F (36 C), temperature source Oral, resp. rate 18, height 5' (1.524 m), weight 125 lb 14.4 oz (57.108 kg).    HEENT: Neck without mass Lymphatics: No cervical, supra-clavicular, axillary, or inguinal nodes Resp: Lungs clear bilaterally Cardio: Regular rate and rhythm GI: No hepatosplenomegaly, nontender, no mass Vascular: No leg edema   Lab Results:  CEA pending   Medications: I have reviewed the patient's current medications.  Assessment/Plan: 1. Stage III (T3 N1) moderately differentiated adenocarcinoma of the left colon status post laparoscopic left colectomy on 08/03/2012. Initiation of adjuvant Xeloda chemotherapy on 08/30/2012. Dose reduction beginning with cycle 3 due to hand-foot syndrome. She completed cycle 4 beginning 11/08/2012. She completed cycle 5 beginning 11/29/2012. She completed cycle 6 beginning on 12/20/2012. She began cycle 7 on 01/10/2013. Cycle 7 was prematurely discontinued on 01/16/2013 due to progressive hand-foot symptoms. She completed the eighth and final cycle of adjuvant Xeloda beginning 01/31/2013. Xeloda was dose reduced with cycle 8.  Negative surveillance colonoscopy 08/08/2013 2. Hand-foot syndrome secondary to Xeloda. Increased during cycle 7 with redness and pain at the feet. Xeloda was dose reduced with cycle 8 with improvement in the symptoms of hand-foot syndrome. 3. Elevated preoperative CEA. Normal on 02/16/2013 4. Status post  right nephrectomy in the 1960s for management of a "stone". 5. History of breast cancer. 6. History of coronary artery disease status post coronary artery bypass surgery in 2003. 7. Incomplete preoperative colonoscopy secondary to obstruction. Negative colonoscopy 08/08/2013 8. History of hypertension. 9. Patient detected "lump" right breast reported when here on 01/26/2013. She is status post mammogram and right breast ultrasound 02/09/2013 with the palpable lump correlating with the dystrophic calcification in the right breast in the upper outer quadrant. 10. Short-term memory loss-we recommended she schedule an appointment with Dr. Pete Glatter  Disposition:  Ms. Condie remains in clinical remission from colon cancer. She will return for an office visit and CEA in 6 months. We discussed the indication for surveillance CT scans. At her age and with comorbid conditions the chance of a benefit from surveillance CT scans is minimal. She is comfortable being followed without surveillance CT scans.  She will schedule an appointment with Dr. Pete Glatter to evaluate the memory loss.   Thornton Papas, MD  08/17/2013  2:05 PM

## 2013-08-17 NOTE — Telephone Encounter (Signed)
gv and printed appt sched and avs for pt for April 2015  °

## 2013-08-18 ENCOUNTER — Telehealth (INDEPENDENT_AMBULATORY_CARE_PROVIDER_SITE_OTHER): Payer: Self-pay | Admitting: General Surgery

## 2013-08-18 NOTE — Telephone Encounter (Signed)
She called to see if she needed to come in for follow up visit since she was doing well.  She has no complaints and no issues with her bowels.  CEA down to 0.8 and recent colonoscopy negative.  She can follow up annually with CEA or sooner if any symptoms.

## 2013-08-22 ENCOUNTER — Encounter: Payer: Self-pay | Admitting: *Deleted

## 2013-08-24 ENCOUNTER — Ambulatory Visit (INDEPENDENT_AMBULATORY_CARE_PROVIDER_SITE_OTHER): Payer: BC Managed Care – HMO | Admitting: General Surgery

## 2013-09-14 ENCOUNTER — Other Ambulatory Visit: Payer: Self-pay

## 2013-12-04 ENCOUNTER — Encounter: Payer: Self-pay | Admitting: Oncology

## 2013-12-04 ENCOUNTER — Telehealth: Payer: Self-pay | Admitting: *Deleted

## 2013-12-04 NOTE — Telephone Encounter (Signed)
Pt left a message on my voicemail stating that she is a pt of Dr. Gearldine Shown and Dr. Garnett Farm office is saying that since her insurance has switched that Dr. Benay Spice is not in her network.  I called Dannielle Huh and gave her this information and she said she would contact the patient and take care of it.

## 2013-12-05 NOTE — Progress Notes (Addendum)
CALL FORWARDED TO ME FROM [name].  I CALLED Marie Burch BACK @ [phone] AND SHE SAID THAT Marie [facility]S OFFICE SAID THAT Marie Burch IS NOT IN NETWORK FOR [employer].  I TOLD HER HE COULD BE LISTED UNDER HEMATOLOGY. I TOLD HER I WOULD CALL TOMORROW AND CHECK ON IT.  12/05/13 I CALLED Marie [facility] OFFICE AND SPOKE TO [name].  SHE DID LOCATE Marie Burch AND SHE WILL SEND THE REFERRAL PRIOR TO Marie Burch'S 02/15/14 APPOINTMENT.  I WILL MAKE A NOTE TO CALL THE WEEK BEFORE.  I CALLED Marie Burch AND WAS RELIEVE TO HEAR IT WILL BE OK.

## 2014-01-25 ENCOUNTER — Telehealth: Payer: Self-pay | Admitting: Oncology

## 2014-01-25 NOTE — Telephone Encounter (Signed)
Talked to pt's husband gave him appt, for r/s lab and ML appt moved to 4/16

## 2014-01-26 ENCOUNTER — Other Ambulatory Visit: Payer: Self-pay | Admitting: Geriatric Medicine

## 2014-01-26 DIAGNOSIS — R1310 Dysphagia, unspecified: Secondary | ICD-10-CM

## 2014-01-30 ENCOUNTER — Other Ambulatory Visit: Payer: Medicare Other

## 2014-02-05 ENCOUNTER — Ambulatory Visit
Admission: RE | Admit: 2014-02-05 | Discharge: 2014-02-05 | Disposition: A | Discharge status: Home / Self Care | Payer: Commercial Managed Care - HMO | Source: Ambulatory Visit | Attending: Geriatric Medicine | Admitting: Geriatric Medicine

## 2014-02-05 ENCOUNTER — Other Ambulatory Visit: Payer: Self-pay | Admitting: Geriatric Medicine

## 2014-02-05 DIAGNOSIS — R0989 Other specified symptoms and signs involving the circulatory and respiratory systems: Secondary | ICD-10-CM

## 2014-02-05 DIAGNOSIS — R1310 Dysphagia, unspecified: Secondary | ICD-10-CM

## 2014-02-15 ENCOUNTER — Ambulatory Visit: Payer: Medicare Other | Admitting: Nurse Practitioner

## 2014-02-15 ENCOUNTER — Other Ambulatory Visit: Payer: Medicare Other

## 2014-02-22 ENCOUNTER — Ambulatory Visit (HOSPITAL_BASED_OUTPATIENT_CLINIC_OR_DEPARTMENT_OTHER): Payer: Commercial Managed Care - HMO | Admitting: Nurse Practitioner

## 2014-02-22 ENCOUNTER — Telehealth: Payer: Self-pay | Admitting: Oncology

## 2014-02-22 ENCOUNTER — Other Ambulatory Visit (HOSPITAL_BASED_OUTPATIENT_CLINIC_OR_DEPARTMENT_OTHER): Payer: Commercial Managed Care - HMO

## 2014-02-22 VITALS — BP 137/85 | HR 53 | Temp 96.9°F | Resp 18 | Ht 60.0 in | Wt 132.0 lb

## 2014-02-22 DIAGNOSIS — R928 Other abnormal and inconclusive findings on diagnostic imaging of breast: Secondary | ICD-10-CM

## 2014-02-22 DIAGNOSIS — L270 Generalized skin eruption due to drugs and medicaments taken internally: Secondary | ICD-10-CM

## 2014-02-22 DIAGNOSIS — R413 Other amnesia: Secondary | ICD-10-CM

## 2014-02-22 DIAGNOSIS — C189 Malignant neoplasm of colon, unspecified: Secondary | ICD-10-CM

## 2014-02-22 LAB — CEA: CEA: 1.1 ng/mL (ref 0.0–5.0)

## 2014-02-22 NOTE — Progress Notes (Signed)
  Marie OFFICE PROGRESS NOTE   Diagnosis:  Stage III colon cancer.  INTERVAL HISTORY:   Marie Burch returns as scheduled. She feels well. No interim illnesses or infections. Bowels overall moving regularly. No bloody or black stools. She has occasional discomfort at the right abdomen. No nausea or vomiting. She continues to note memory issues.  Objective:  Vital signs in last 24 hours:  Blood pressure 164/92, pulse 53, temperature 96.9 F (36.1 C), temperature source Oral, resp. rate 18, height 5' (1.524 m), weight 132 lb (59.875 kg).  Repeat blood pressure 137/85.  HEENT: No thrush or ulcerations. Lymphatics: No palpable cervical, supraclavicular, axillary or inguinal lymph nodes. Resp: Lungs clear. Cardio: Regular cardiac rhythm. GI: Abdomen soft and nontender. No hepatomegaly. No mass. Vascular: No leg edema. Neuro: Alert and oriented. Follows commands.    Lab Results:  Lab Results  Component Value Date   WBC 6.3 02/16/2013   HGB 13.4 02/16/2013   HCT 39.4 02/16/2013   MCV 100.8 02/16/2013   PLT 189 02/16/2013   NEUTROABS 2.7 02/16/2013    Imaging:  No results found.  Medications: I have reviewed the patient's current medications.  Assessment/Plan: 1. Stage III (T3 N1) moderately differentiated adenocarcinoma of the left colon status post laparoscopic left colectomy on 08/03/2012. Initiation of adjuvant Xeloda chemotherapy on 08/30/2012. Dose reduction beginning with cycle 3 due to hand-foot syndrome. She completed cycle 4 beginning 11/08/2012. She completed cycle 5 beginning 11/29/2012. She completed cycle 6 beginning on 12/20/2012. She began cycle 7 on 01/10/2013. Cycle 7 was prematurely discontinued on 01/16/2013 due to progressive hand-foot symptoms. She completed the eighth and final cycle of adjuvant Xeloda beginning 01/31/2013. Xeloda was dose reduced with cycle 8. Negative surveillance colonoscopy 08/08/2013. 2. Hand-foot syndrome secondary to  Xeloda. Increased during cycle 7 with redness and pain at the feet. Xeloda was dose reduced with cycle 8 with improvement in the symptoms of hand-foot syndrome. 3. Elevated preoperative CEA. Normal on 02/16/2013. 4. Status post right nephrectomy in the 1960s for management of a "stone". 5. History of breast cancer. 6. History of coronary artery disease status post coronary artery bypass surgery in 2003. 7. Incomplete preoperative colonoscopy secondary to obstruction. Negative colonoscopy 08/08/2013 8. History of hypertension. 9. Patient detected "lump" right breast reported when here on 01/26/2013. She is status post mammogram and right breast ultrasound 02/09/2013 with the palpable lump correlating with the dystrophic calcification in the right breast in the upper outer quadrant. 10. Short-term memory loss. She will follow-up with Dr. Felipa Eth.   Disposition: She remains in clinical remission from colon cancer. We will followup on the CEA from today. She will return for a followup visit and CEA in 6 months. She will contact the office in the interim with any problems.  Plan reviewed with Dr. Benay Spice.    Owens Shark ANP/GNP-BC   02/22/2014  11:16 AM

## 2014-02-22 NOTE — Telephone Encounter (Signed)
gv and printed appt sched and avs for pt for OCT. °

## 2014-02-23 ENCOUNTER — Telehealth: Payer: Self-pay | Admitting: *Deleted

## 2014-02-23 NOTE — Telephone Encounter (Signed)
Message copied by Norma Fredrickson on Fri Feb 23, 2014  9:22 AM ------      Message from: Betsy Coder B      Created: Thu Feb 22, 2014  7:39 PM       Please call patient, cea is normal ------

## 2014-02-23 NOTE — Telephone Encounter (Signed)
Called and informed patient of normal cea.  Per Dr. Sherrill.  Patient verbalized understanding.  

## 2014-02-24 ENCOUNTER — Encounter: Payer: Self-pay | Admitting: Oncology

## 2014-05-16 ENCOUNTER — Ambulatory Visit: Payer: Medicare Other | Admitting: Interventional Cardiology

## 2014-06-04 ENCOUNTER — Other Ambulatory Visit: Payer: Self-pay

## 2014-06-04 MED ORDER — HYDROCHLOROTHIAZIDE 25 MG PO TABS: 12.5000 mg | ORAL_TABLET | Freq: Every day | ORAL | Status: DC

## 2014-06-05 ENCOUNTER — Other Ambulatory Visit: Payer: Self-pay | Admitting: Interventional Cardiology

## 2014-06-06 MED ORDER — HYDROCHLOROTHIAZIDE 25 MG PO TABS: 12.5000 mg | ORAL_TABLET | Freq: Every day | ORAL | Status: DC

## 2014-06-12 ENCOUNTER — Ambulatory Visit (INDEPENDENT_AMBULATORY_CARE_PROVIDER_SITE_OTHER): Payer: Commercial Managed Care - HMO | Admitting: Interventional Cardiology

## 2014-06-12 ENCOUNTER — Encounter: Payer: Self-pay | Admitting: Interventional Cardiology

## 2014-06-12 VITALS — BP 126/64 | HR 61 | Ht 60.0 in | Wt 128.0 lb

## 2014-06-12 DIAGNOSIS — Z8679 Personal history of other diseases of the circulatory system: Secondary | ICD-10-CM | POA: Insufficient documentation

## 2014-06-12 DIAGNOSIS — R0789 Other chest pain: Secondary | ICD-10-CM

## 2014-06-12 DIAGNOSIS — I10 Essential (primary) hypertension: Secondary | ICD-10-CM | POA: Insufficient documentation

## 2014-06-12 DIAGNOSIS — Z9889 Other specified postprocedural states: Secondary | ICD-10-CM

## 2014-06-12 DIAGNOSIS — I2510 Atherosclerotic heart disease of native coronary artery without angina pectoris: Secondary | ICD-10-CM

## 2014-06-12 NOTE — Progress Notes (Signed)
Patient ID: Marie Burch, female   DOB: 1934-10-27, 80 y.o.   MRN: [id number]    [address] [place], [place]  [address] Phone: [phone] Fax:  [phone]  Date:  06/12/2014   ID:  Marie Burch, DOB [birth year], MRN [id number]  PCP:  Mathews Argyle, MD   ASSESSMENT:  1. Coronary atherosclerotic heart disease with angina 2. Status post mitral valve repair 3. Hypertension 4. Hyperlipidemia  PLAN:  1. Pharmacologic myocardial perfusion study to rule out bypass graft failure as a cause of the patient's recurring chest pain 2. Otherwise clinical followup in one year 3. Sublingual nitroglycerin. Prolonged chest pain   SUBJECTIVE: Marie Burch is a 80 y.o. female who is in for followup of CAD and mitral valve repair. She underwent mitral valve repair in 2003 along with CABG x2. Over the past several months she has noticed "incisional pain in the chest". All further details she has a difficult time characterizing the discomfort. The discomfort occurs with activity and is severe enough to cause her to rest. With rest it goes away. It does not occur without provocation.   Wt Readings from Last 3 Encounters:  06/12/14 128 lb (58.06 kg)  02/22/14 132 lb (59.875 kg)  08/17/13 125 lb 14.4 oz (57.108 kg)     Past Medical History  Diagnosis Date  . Blood transfusion 1960's    During Nephrectomy Procedure  . GERD (gastroesophageal reflux disease)   . Hyperlipidemia   . Anxiety   . Depression   . Arthritis   . Hypertension     dr Daneen Schick  . S/P radiation therapy 11/29/01 - 03/18/02    Right Breast: 5,040 cGy/28 Fractions with Boost for Total Dose  of 6,3000 cGy  . Complication of anesthesia     "I quit breathing" (patient reported it was due to PCN allergy though '60's)  . Coronary artery disease   . Heart murmur     s/p MV ring annuloplasty '03  . Chronic kidney disease     history of right nephrectomy due to stone '60's  . Cancer 2001 and 2002   breast, right  . Cancer of colon 08/03/12    Current Outpatient Prescriptions  Medication Sig Dispense Refill  . atenolol (TENORMIN) 25 MG tablet Take 25 mg by mouth every morning.       . clobetasol ointment (TEMOVATE) 0.05 % Apply topically as needed. Twice weekly to vagina      . clonazePAM (KLONOPIN) 0.5 MG tablet Take 0.25-0.5 mg by mouth at bedtime as needed. For sleep      . hydrochlorothiazide (HYDRODIURIL) 25 MG tablet Take 0.5 tablets (12.5 mg total) by mouth daily.  15 tablet  2  . HYDROcodone-acetaminophen (NORCO/VICODIN) 5-325 MG per tablet Take 1 tablet by mouth every 4 (four) hours as needed (pain).  40 tablet  0  . lidocaine (LIDODERM) 5 % Place 0.5-1 patches onto the skin daily as needed. For back pain. Remove & Discard patch within 12 hours or as directed by MD      . LORazepam (ATIVAN) 0.5 MG tablet Take 0.5 mg by mouth daily as needed. anxiety      . Naproxen Sodium (ALEVE) 220 MG CAPS Take 220 mg by mouth daily as needed (for pain).       . nitroGLYCERIN (NITROSTAT) 0.4 MG SL tablet Place 0.4 mg under the tongue every 5 (five) minutes as needed. For chest pain      .  Omega-3 Fatty Acids (FISH OIL) 1000 MG CAPS Take 1,000 mg by mouth 2 (two) times daily.       Vladimir Faster Glycol-Propyl Glycol (SYSTANE ULTRA) 0.4-0.3 % SOLN Apply 1 drop to eye daily as needed (for blurry vision).      . polyethylene glycol (MIRALAX / GLYCOLAX) packet Take 17 g by mouth daily as needed (for constipation).      . sertraline (ZOLOFT) 50 MG tablet Take 25 mg by mouth every morning. Takes 1/2 of 50 mg tab daily      . traMADol (ULTRAM) 50 MG tablet Take 50 mg by mouth every 6 (six) hours as needed for pain.       Marland Kitchen ZOSTAVAX 00370 UNT/0.65ML injection        No current facility-administered medications for this visit.    Allergies:    Allergies  Allergen Reactions  . Penicillins Anaphylaxis  . Levaquin [Levofloxacin] Other (See Comments)    PT TOLD TO LEAVE ANTIBIOTICS ALONE AFTER  NEPHRECTOMY PT HAD REACTION TO ANTIBIOTICS  . Niacin And Related Other (See Comments)    unknown  . Norvasc [Amlodipine Besylate] Other (See Comments)    Unknown, PT NOT SURE  . Novocain [Procaine Hcl]     Numbness would not leave   . Pravachol [Pravastatin Sodium] Other (See Comments)    Unknown, ? Questionable muscle aches  . Statins Other (See Comments)    Unknown, questionable muscle aches  . Sulfa Antibiotics   . Zetia [Ezetimibe] Other (See Comments)    Unknown, pt not sure    Social History:  The patient  reports that she has never smoked. She has never used smokeless tobacco. She reports that she does not drink alcohol or use illicit drugs.   ROS:  Please see the history of present illness.   She denies palpitations, syncope, lightheadedness, dizziness, and edema. Appetite is been stable.   All other systems reviewed and negative.   OBJECTIVE: VS:  BP 126/64  Pulse 61  Ht 5' (1.524 m)  Wt 128 lb (58.06 kg)  BMI 25.00 kg/m2 Well nourished, well developed, in no acute distress, elderly HEENT: normal Neck: JVD flat . Carotid bruit absent  Cardiac:  normal S1, S2; RRR; no murmur Lungs:  clear to auscultation bilaterally, no wheezing, rhonchi or rales Abd: soft, nontender, no hepatomegaly Ext: Edema none. Pulses 2+ Skin: warm and dry Neuro:  CNs 2-12 intact, no focal abnormalities noted  EKG:  Normal sinus rhythm with an occasional blocked PAC and poor R-wave progression       Signed, Illene Labrador III, MD 06/12/2014 8:09 AM

## 2014-06-12 NOTE — Patient Instructions (Addendum)
Your physician recommends that you continue on your current medications as directed. Please refer to the Current Medication list given to you today.  A new Rx for Nitro-glycerin has been sent to your pharmacy  Your physician has requested that you have a lexiscan myoview. For further information please visit [email or web address]. Please follow instruction sheet, as given.  Your physician wants you to follow-up in: 1 year You will receive a reminder letter in the mail two months in advance. If you don't receive a letter, please call our office to schedule the follow-up appointment.

## 2014-06-25 ENCOUNTER — Encounter: Payer: Self-pay | Admitting: Oncology

## 2014-06-25 NOTE — Progress Notes (Signed)
CALLED PT. HER PAIN IS AT THE BOTTOM OF HER STOMACH, HIPS, AND ACROSS HER BACK AT THE WAIST. SINCE Thursday THE PAIN IS CONSTANT AT AN EIGHT THIS MORNING PT. TOOK AN ALEVE AND THE PAIN DECREASED TO A FIVE FOR ABOUT FOUR HOURS UNTIL SHE EAT LUNCH. THE PAIN IS WORSE AFTER SHE EATS. PT. HAS HAD SOME PROBLEMS WITH CONSTIPATION. LAST GOOD BOWEL MOVEMENT WAS 06/22/14. PT. HAS A GOOD BOWEL MOVEMENT WHEN SHE TAKES  A STOOL SOFTER EVERY THREE TO FOUR DAYS. NO BLOOD IN STOOL AND STOOLS ARE NOT BLACK. PT. MENTIONED SHE HAS DIVERTICULITIS AND HAS EATEN SOME TOMATOES BUT STOPPED EATING THE TOMATOES AND THE PAIN DECREASED. PT. IS EATING AND DRINKING. NO NAUSEA,VOMITING, OR DIARRHEA. THIS NOTE TO DR.SHERRILL'S NURSE, TANYA WHITLOCK.

## 2014-06-26 NOTE — Progress Notes (Signed)
VERBAL ORDER AND READ BACK TO DR.[place]real [place] UP WITH GYN. SHE IS TO CONTINUE A STOOL SOFTER DAILY. CALL IF CONDITION IS NOT BETTER. NOTIFIED PT. OF THE ABOVE INSTRUCTIONS. SHE VOICES UNDERSTANDING.

## 2014-06-28 ENCOUNTER — Other Ambulatory Visit: Payer: Self-pay | Admitting: Interventional Cardiology

## 2014-07-10 ENCOUNTER — Ambulatory Visit (HOSPITAL_COMMUNITY): Payer: Medicare HMO | Attending: Interventional Cardiology | Admitting: Radiology

## 2014-07-10 VITALS — BP 122/77 | HR 53 | Ht 60.0 in | Wt 126.0 lb

## 2014-07-10 DIAGNOSIS — I2510 Atherosclerotic heart disease of native coronary artery without angina pectoris: Secondary | ICD-10-CM | POA: Insufficient documentation

## 2014-07-10 DIAGNOSIS — Z9861 Coronary angioplasty status: Secondary | ICD-10-CM | POA: Diagnosis not present

## 2014-07-10 DIAGNOSIS — R9439 Abnormal result of other cardiovascular function study: Secondary | ICD-10-CM | POA: Insufficient documentation

## 2014-07-10 DIAGNOSIS — R079 Chest pain, unspecified: Secondary | ICD-10-CM | POA: Insufficient documentation

## 2014-07-10 DIAGNOSIS — Z8249 Family history of ischemic heart disease and other diseases of the circulatory system: Secondary | ICD-10-CM | POA: Insufficient documentation

## 2014-07-10 DIAGNOSIS — R0789 Other chest pain: Secondary | ICD-10-CM

## 2014-07-10 MED ORDER — TECHNETIUM TC 99M SESTAMIBI GENERIC - CARDIOLITE
30.0000 | Freq: Once | INTRAVENOUS | Status: AC | PRN
  Administered 2014-07-10: 30 via INTRAVENOUS

## 2014-07-10 MED ORDER — REGADENOSON 0.4 MG/5ML IV SOLN
0.4000 mg | Freq: Once | INTRAVENOUS | Status: AC
  Administered 2014-07-10: 0.4 mg via INTRAVENOUS

## 2014-07-10 MED ORDER — TECHNETIUM TC 99M SESTAMIBI GENERIC - CARDIOLITE
10.0000 | Freq: Once | INTRAVENOUS | Status: AC | PRN
  Administered 2014-07-10: 10 via INTRAVENOUS

## 2014-07-10 NOTE — Progress Notes (Signed)
[facility] [address] [place], [place] [address] [phone]    Cardiology Nuclear Med Study  Marie Burch is a 80 y.o. female     MRN : [id number]     DOB: 11-11-1933  Procedure Date: 07/10/2014  Nuclear Med Background Indication for Stress Test:  Evaluation for Ischemia and Graft Patency History:  CAD, Cath, CABG x2, MVR Cardiac Risk Factors: Strong Premature Family History - CAD, Hypertension and Lipids  Symptoms:  Chest Pain with Exertion (last date of chest discomfort was two weeks ago)   Nuclear Pre-Procedure Caffeine/Decaff Intake:  None> 12 hrs NPO After: 8:00pm   Lungs:  clear O2 Sat: 98% on room air. IV 0.9% NS with Angio Cath:  20g  IV Site: L Antecubital x 1, tolerated well  IV Started by:  Irven Baltimore, RN  Chest Size (in):  38 Cup Size: DD  Height: 5' (1.524 m)  Weight:  126 lb (57.153 kg)  BMI:  Body mass index is 24.61 kg/(m^2). Tech Comments:  Patient took Atenolol this am. Irven Baltimore, RN    Nuclear Med Study 1 or 2 day study: 1 day  Stress Test Type:  Treadmill/Lexiscan  Reading MD: NA  Order Authorizing Provider:  Daneen Schick, MD  Resting Radionuclide: Technetium 39m Sestamibi  Resting Radionuclide Dose: 11.0 mCi   Stress Radionuclide:  Technetium 27m Sestamibi  Stress Radionuclide Dose: 33.0 mCi           Stress Protocol Rest HR: 53 Stress HR: 77  Rest BP: 122/77 Stress BP: 110/65  Exercise Time (min): n/a METS: n/a           Dose of Adenosine (mg):  n/a Dose of Lexiscan: 0.4 mg  Dose of Atropine (mg): n/a Dose of Dobutamine: n/a mcg/kg/min (at max HR)  Stress Test Technologist: Glade Lloyd, BS-ES  Nuclear Technologist:  Annye Rusk, CNMT     Rest Procedure:  Myocardial perfusion imaging was performed at rest 45 minutes following the intravenous administration of Technetium 45m Sestamibi. Rest ECG: NSR - Normal EKG  Stress Procedure:  The patient received IV Lexiscan 0.4 mg over 15-seconds with  concurrent low level exercise and then Technetium 9m Sestamibi was injected at 30-seconds while the patient continued walking one more minute.  Quantitative spect images were obtained after a 45-minute delay.  During the infusion of Lexiscan the patient complained of cough and hip pain.  These symptoms resolved in recovery.  Stress ECG: IVCD developed with lexiscan, which resolved after infusion  QPS Raw Data Images:  There is a breast shadow that accounts for the anterior attenuation. Stress Images:  There is decreased uptake in the apex. Rest Images:  There is decreased uptake in the apex. Subtraction (SDS):  There is a fixed anteriour defect that is most consistent with breast attenuation. Transient Ischemic Dilatation (Normal <1.22):  0.93 Lung/Heart Ratio (Normal <0.45):  0.30  Quantitative Gated Spect Images QGS EDV:  42 ml QGS ESV:  13 ml  Impression Exercise Capacity:  Lexiscan with low level exercise. BP Response:  Normal blood pressure response. Clinical Symptoms:  No significant symptoms noted. ECG Impression:  Transient IVCD with lexiscan, otherwise, no ischemic changes Comparison with Prior Nuclear Study: No previous nuclear study performed  Overall Impression:  Low risk stress nuclear study with mild anteroapical breast attenuation artifact. No reversible ischemia.  LV Ejection Fraction: 68%.  LV Wall Motion:  Normal Wall Motion  Pixie Casino, MD, [facility] Board Certified in  Nuclear Cardiology Attending Cardiologist [place]

## 2014-07-12 ENCOUNTER — Telehealth: Payer: Self-pay

## 2014-07-12 NOTE — Telephone Encounter (Signed)
Message copied by Lamar Laundry on Thu Jul 12, 2014  9:17 AM ------      Message from: Daneen Schick      Created: Wed Jul 11, 2014  2:50 PM       Study was without significant abnormality. Further w/u not needed. Let me know if symptoms worsen. ------

## 2014-07-12 NOTE — Telephone Encounter (Signed)
pt aware of lexiscan results.Study was without significant abnormality. Further w/u not needed. Let me know if symptoms worsen.pt verbalized understanding.

## 2014-07-18 ENCOUNTER — Encounter: Payer: Self-pay | Admitting: Oncology

## 2014-08-06 ENCOUNTER — Telehealth: Payer: Self-pay | Admitting: Interventional Cardiology

## 2014-08-06 NOTE — Telephone Encounter (Signed)
returned pt call. pt was previously given lexiscan results on 9/3.pt given results again.Study was without significant abnormality. Further w/u not needed. Let me knowus symptoms worsen.pt verbalized understanding.

## 2014-08-06 NOTE — Telephone Encounter (Signed)
°  Patient states she had a Echo and hasn't got any results. She is very upset that no one has called her, please call and advise.

## 2014-08-20 ENCOUNTER — Telehealth: Payer: Self-pay | Admitting: Oncology

## 2014-08-20 NOTE — Telephone Encounter (Signed)
s/w pt re appt for 10/23

## 2014-08-24 ENCOUNTER — Other Ambulatory Visit: Payer: Commercial Managed Care - HMO

## 2014-08-24 ENCOUNTER — Ambulatory Visit: Payer: Commercial Managed Care - HMO | Admitting: Oncology

## 2014-08-29 ENCOUNTER — Telehealth: Payer: Self-pay | Admitting: Interventional Cardiology

## 2014-08-29 NOTE — Telephone Encounter (Signed)
New message          Pt is calling for Nuclear Stress Test results

## 2014-08-29 NOTE — Telephone Encounter (Signed)
Pt would like to speak to Dr. Tamala Julian. Pt is aware of the stress test results. Pt is award that Md is not in the office today. She is aware that this message will be send to MD for reviewing.

## 2014-08-31 ENCOUNTER — Ambulatory Visit (HOSPITAL_BASED_OUTPATIENT_CLINIC_OR_DEPARTMENT_OTHER): Payer: Commercial Managed Care - HMO | Admitting: Oncology

## 2014-08-31 ENCOUNTER — Telehealth: Payer: Self-pay | Admitting: Oncology

## 2014-08-31 ENCOUNTER — Other Ambulatory Visit (HOSPITAL_BASED_OUTPATIENT_CLINIC_OR_DEPARTMENT_OTHER): Payer: Commercial Managed Care - HMO

## 2014-08-31 VITALS — BP 147/76 | HR 72 | Temp 97.8°F | Resp 18 | Ht 60.0 in | Wt 129.1 lb

## 2014-08-31 DIAGNOSIS — C189 Malignant neoplasm of colon, unspecified: Secondary | ICD-10-CM

## 2014-08-31 DIAGNOSIS — Z85038 Personal history of other malignant neoplasm of large intestine: Secondary | ICD-10-CM

## 2014-08-31 DIAGNOSIS — Z853 Personal history of malignant neoplasm of breast: Secondary | ICD-10-CM

## 2014-08-31 LAB — CEA: CEA: 0.7 ng/mL (ref 0.0–5.0)

## 2014-08-31 NOTE — Progress Notes (Signed)
  [place] OFFICE PROGRESS NOTE   Diagnosis: Colon cancer  INTERVAL HISTORY:   Marie Burch returns as scheduled. She reports occasional constipation. No bleeding. Good appetite. She reports increased difficulty with memory recall. She has seen Dr. Felipa Eth.  Objective:  Vital signs in last 24 hours:  Blood pressure 147/76, pulse 72, temperature 97.8 F (36.6 C), temperature source Oral, resp. rate 18, height 5' (1.524 m), weight 129 lb 1.6 oz (58.559 kg).    HEENT: Neck without mass Lymphatics: No cervical, supra-clavicular, axillary, or inguinal nodes Resp: Lungs clear bilaterally Cardio: Regular rate and rhythm GI: No hepatomegaly, nontender, no mass Vascular: No leg edema   Lab Results  Component Value Date   CEA 1.1 02/22/2014    Medications: I have reviewed the patient's current medications.  Assessment/Plan: 1. Stage III (T3 N1) moderately differentiated adenocarcinoma of the left colon status post laparoscopic left colectomy on 08/03/2012. Initiation of adjuvant Xeloda chemotherapy on 08/30/2012. Dose reduction beginning with cycle 3 due to hand-foot syndrome. She completed cycle 4 beginning 11/08/2012. She completed cycle 5 beginning 11/29/2012. She completed cycle 6 beginning on 12/20/2012. She began cycle 7 on 01/10/2013. Cycle 7 was prematurely discontinued on 01/16/2013 due to progressive hand-foot symptoms. She completed the eighth and final cycle of adjuvant Xeloda beginning 01/31/2013. Xeloda was dose reduced with cycle 8. Negative surveillance colonoscopy 08/08/2013. 2. Hand-foot syndrome secondary to Xeloda. Increased during cycle 7 with redness and pain at the feet. Xeloda was dose reduced with cycle 8 with improvement in the symptoms of hand-foot syndrome. 3. Elevated preoperative CEA.  4. Status post right nephrectomy in the 1960s for management of a "stone". 5. History of breast cancer. 6. History of coronary artery disease status post  coronary artery bypass surgery in 2003. 7. Incomplete preoperative colonoscopy secondary to obstruction. Negative colonoscopy 08/08/2013 8. History of hypertension. 9. Patient detected "lump" right breast reported when here on 01/26/2013. She is status post mammogram and right breast ultrasound 02/09/2013 with the palpable lump correlating with the dystrophic calcification in the right breast in the upper outer quadrant. 10. Memory loss-? Alzheimers, followed by Dr. Felipa Eth  Disposition:  Marie Burch remains in clinical remission from colon cancer. We will followup on the CEA from today. She will return for office visit and CEA in 6 months.  Betsy Coder, MD  08/31/2014  8:39 AM

## 2014-08-31 NOTE — Telephone Encounter (Signed)
gv adn printed appt sched and avs for pt for April 2016...no pof

## 2014-09-04 ENCOUNTER — Telehealth: Payer: Self-pay | Admitting: *Deleted

## 2014-09-04 NOTE — Telephone Encounter (Signed)
Call from pt requesting CEA results. Normal, per Dr. Benay Spice. She voiced understanding.

## 2014-09-04 NOTE — Telephone Encounter (Signed)
Message copied by Brien Few on Tue Sep 04, 2014  9:31 AM ------      Message from: Ladell Pier      Created: Mon Sep 03, 2014 10:13 AM       Please call patient, cea is normal ------

## 2014-10-26 ENCOUNTER — Other Ambulatory Visit: Payer: Self-pay | Admitting: Interventional Cardiology

## 2014-12-03 ENCOUNTER — Telehealth: Payer: Self-pay | Admitting: *Deleted

## 2014-12-03 ENCOUNTER — Telehealth: Payer: Self-pay | Admitting: Nurse Practitioner

## 2014-12-03 NOTE — Telephone Encounter (Signed)
Attempted to call back. No answer.

## 2014-12-03 NOTE — Telephone Encounter (Signed)
Pt called requesting to speak with Ned Card -stating concern due to " ongoing pain in my breast ".  Per phone assessment pt states pain is in bilateral breast - constant- but she is unable to describe what pain feels like.  Deyra states " I had breast cancer in my right breast 2 times but now both breast are hurting"  " could it be the colon cancer that has spread to my breast "  " I also have had open heart surgery and the incision area hurts too "  " I called [facility]- but they told me to call Dr Gearldine Shown office "  Per above inquiry will be made if pt needs to have mammogram early or if pt's concern needs to be directed to primary MD.  Last mammogram was March of 2015  Return call for pt is [phone].   THIS NOTE WILL BE SENT TO Ned Card NP and NURSE AT DESK FOR FOLLOW UP.

## 2014-12-05 ENCOUNTER — Telehealth: Payer: Self-pay | Admitting: Nurse Practitioner

## 2014-12-05 NOTE — Telephone Encounter (Signed)
I spoke with Marie Burch regarding her phone call from earlier in the week. She reports a several week history of bilateral breast/chest pain. The pain "comes and goes". There are no exacerbating factors. The breast pain is sharp at times. She has been taking Aleve. I told her it was very unlikely the pain was related to her previous chemotherapy or colon cancer. I recommended she contact her PCP for evaluation.

## 2014-12-05 NOTE — Telephone Encounter (Signed)
Attempted to return call. No answer.

## 2014-12-12 ENCOUNTER — Telehealth: Payer: Self-pay

## 2014-12-12 NOTE — Telephone Encounter (Signed)
Called to follow up with pt regarding breat pain, no answer.

## 2014-12-14 ENCOUNTER — Telehealth: Payer: Self-pay

## 2014-12-14 NOTE — Telephone Encounter (Signed)
Attempted to call pt back to follow up. No answer.

## 2014-12-17 ENCOUNTER — Telehealth: Payer: Self-pay

## 2014-12-17 NOTE — Telephone Encounter (Signed)
Called pt to follow up regarding recent breast pain. Pt states she still has the breast pain but has not went to see her PCP yet. Informed pt to call us once she has followed up with PCP regarding this issue. Informed Marlynn Perking.

## 2014-12-28 NOTE — Telephone Encounter (Signed)
none

## 2015-01-04 ENCOUNTER — Other Ambulatory Visit: Payer: Self-pay | Admitting: Hematology & Oncology

## 2015-01-04 ENCOUNTER — Telehealth: Payer: Self-pay

## 2015-01-04 NOTE — Telephone Encounter (Signed)
Benjamine Mola called from [employer] [facility]. Pt had called informed her that she was having some breast pain and wanted something for pain management. Pt informed Benjamine Mola that she just couldnt call around and requested her to call the Ned Card. Informed Benjamine Mola that we have not prescribed any pain medication for her and the last time we spoke with the pt she was directed to call her PCP regarding this pain. Tried calling pt with no answer Called Dr.Stonekings office pt has appt March 24th with PCP, LM with Lelan Pons Dr.Stonekings nurse and informed her of pt situation.

## 2015-01-31 ENCOUNTER — Other Ambulatory Visit: Payer: Self-pay | Admitting: Geriatric Medicine

## 2015-01-31 ENCOUNTER — Other Ambulatory Visit (HOSPITAL_COMMUNITY): Payer: Self-pay | Admitting: Internal Medicine

## 2015-01-31 ENCOUNTER — Ambulatory Visit (HOSPITAL_COMMUNITY)
Admission: RE | Admit: 2015-01-31 | Discharge: 2015-01-31 | Disposition: A | Discharge status: Home / Self Care | Payer: PPO | Source: Ambulatory Visit | Attending: Internal Medicine | Admitting: Internal Medicine

## 2015-01-31 DIAGNOSIS — R1310 Dysphagia, unspecified: Secondary | ICD-10-CM | POA: Diagnosis present

## 2015-01-31 DIAGNOSIS — I2510 Atherosclerotic heart disease of native coronary artery without angina pectoris: Secondary | ICD-10-CM | POA: Insufficient documentation

## 2015-01-31 DIAGNOSIS — G4489 Other headache syndrome: Secondary | ICD-10-CM

## 2015-01-31 DIAGNOSIS — Z85038 Personal history of other malignant neoplasm of large intestine: Secondary | ICD-10-CM | POA: Insufficient documentation

## 2015-01-31 DIAGNOSIS — I10 Essential (primary) hypertension: Secondary | ICD-10-CM | POA: Insufficient documentation

## 2015-02-15 ENCOUNTER — Ambulatory Visit
Admission: RE | Admit: 2015-02-15 | Discharge: 2015-02-15 | Disposition: A | Discharge status: Home / Self Care | Payer: Medicare (Managed Care) | Source: Ambulatory Visit | Attending: Geriatric Medicine | Admitting: Geriatric Medicine

## 2015-02-15 DIAGNOSIS — G4489 Other headache syndrome: Secondary | ICD-10-CM

## 2015-02-15 MED ORDER — GADOBENATE DIMEGLUMINE 529 MG/ML IV SOLN
12.0000 mL | Freq: Once | INTRAVENOUS | Status: AC | PRN
  Administered 2015-02-15: 12 mL via INTRAVENOUS

## 2015-02-26 ENCOUNTER — Telehealth: Payer: Self-pay | Admitting: *Deleted

## 2015-02-26 NOTE — Telephone Encounter (Signed)
Received message from Lorene Dy (pt's daughter) update from Dr. Felipa Eth appt.   She reports pt having memory issues; Stoneking ordered MRI Brain and results were normal; Barium Swallow was ordered and that too came back normal noted "slow motility" and Dr. Felipa Eth increased her Zoloft to 50 mg, up from 25 mg d/t more depression issues.  Daughter wanted MD to be aware prior to appt Thursday 4/21.  Note to Dr. Benay Spice.

## 2015-02-28 ENCOUNTER — Telehealth: Payer: Self-pay | Admitting: Oncology

## 2015-02-28 ENCOUNTER — Ambulatory Visit (HOSPITAL_BASED_OUTPATIENT_CLINIC_OR_DEPARTMENT_OTHER): Payer: PPO | Admitting: Oncology

## 2015-02-28 ENCOUNTER — Other Ambulatory Visit (HOSPITAL_BASED_OUTPATIENT_CLINIC_OR_DEPARTMENT_OTHER): Payer: PPO

## 2015-02-28 VITALS — BP 150/88 | HR 80 | Temp 98.0°F | Resp 18 | Ht 60.0 in | Wt 129.2 lb

## 2015-02-28 DIAGNOSIS — R413 Other amnesia: Secondary | ICD-10-CM

## 2015-02-28 DIAGNOSIS — C189 Malignant neoplasm of colon, unspecified: Secondary | ICD-10-CM

## 2015-02-28 DIAGNOSIS — Z85038 Personal history of other malignant neoplasm of large intestine: Secondary | ICD-10-CM

## 2015-02-28 DIAGNOSIS — Z853 Personal history of malignant neoplasm of breast: Secondary | ICD-10-CM | POA: Diagnosis not present

## 2015-02-28 DIAGNOSIS — R109 Unspecified abdominal pain: Secondary | ICD-10-CM

## 2015-02-28 DIAGNOSIS — M545 Low back pain: Secondary | ICD-10-CM | POA: Diagnosis not present

## 2015-02-28 NOTE — Progress Notes (Signed)
  [place] [place] OFFICE PROGRESS NOTE   Diagnosis: Colon cancer  INTERVAL HISTORY:   Ms. Marie Burch returns as scheduled. She reports a good appetite.occasional constipation relieved she has occasional constipation relieved with MiraLAX. No bleeding. She has intermittent pain in the back and abdomen. She also completed bilateral breast pain. No palpable change. She reports a recent negative mammogram. Her family report she has increasing difficulty with memory loss and depression. She is seeing Dr. Felipa Eth next month.  Objective:  Vital signs in last 24 hours:  Blood pressure 150/88, pulse 80, temperature 98 F (36.7 C), temperature source Oral, resp. rate 18, height 5' (1.524 m), weight 129 lb 3.2 oz (58.605 kg), SpO2 98 %.  HEENT: Neck without mass Lymphatics: No cervical, supraclavicular, axillary, or inguinal nodes Lungs: Clear bilaterally  Cardio:Regular rate and rhythm Abdomen: Soft and nontender, no hepatosplenomegaly, no mass  Vascular: No leg edema Lab Results:    Lab Results  Component Value Date   CEA 0.7 08/31/2014   Medications: I have reviewed the patient's current medications.  Assessment/Plan: 1. Stage III (T3 N1) moderately differentiated adenocarcinoma of the left colon status post laparoscopic left colectomy on 08/03/2012. Initiation of adjuvant Xeloda chemotherapy on 08/30/2012. Dose reduction beginning with cycle 3 due to hand-foot syndrome. She completed cycle 4 beginning 11/08/2012. She completed cycle 5 beginning 11/29/2012. She completed cycle 6 beginning on 12/20/2012. She began cycle 7 on 01/10/2013. Cycle 7 was prematurely discontinued on 01/16/2013 due to progressive hand-foot symptoms. She completed the eighth and final cycle of adjuvant Xeloda beginning 01/31/2013. Xeloda was dose reduced with cycle 8.  Negative surveillance colonoscopy 08/08/2013. 2. Hand-foot syndrome secondary to Xeloda. Increased during cycle 7 with redness and pain at  the feet. Xeloda was dose reduced with cycle 8 with improvement in the symptoms of hand-foot syndrome. 3. Elevated preoperative CEA.  4. Status post right nephrectomy in the 1960s for management of a "stone". 5. History of breast cancer. 6. History of coronary artery disease status post coronary artery bypass surgery in 2003. 7. Incomplete preoperative colonoscopy secondary to obstruction. Negative colonoscopy 08/08/2013 8. History of hypertension. 9. Patient detected "lump" right breast reported when here on 01/26/2013. She is status post mammogram and right breast ultrasound 02/09/2013 with the palpable lump correlating with the dystrophic calcification in the right breast in the upper outer quadrant. 10. Memory loss-? Alzheimers, followed by Dr. Felipa Eth   Disposition:   Marie Burch remains in clinical remission from colon cancer. We will follow-up on the CEA from today. She will return for an office visit and CEA in 6 months. She will discuss the indication for additional surveillance colonoscopies with Dr. Felipa Eth. I think it is reasonable to forego colonoscopies given her age and apparent dementia.  Betsy Coder, MD  02/28/2015  9:46 AM

## 2015-02-28 NOTE — Telephone Encounter (Signed)
Gave avs & calendar for October. °

## 2015-03-01 ENCOUNTER — Telehealth: Payer: Self-pay | Admitting: *Deleted

## 2015-03-01 LAB — CEA: CEA: 1.3 ng/mL (ref 0.0–5.0)

## 2015-03-01 NOTE — Telephone Encounter (Signed)
TC from patient regarding results of CEA done yesterday-had office visit with Dr. Benay Spice. Reviewed CEA results with patient and follow up plan she has with Dr. Benay Spice She will see him again in 6 months with repeat CEA.  Patient verbalized understanding.

## 2015-04-15 ENCOUNTER — Other Ambulatory Visit: Payer: Self-pay

## 2015-04-15 ENCOUNTER — Other Ambulatory Visit: Payer: Self-pay | Admitting: Interventional Cardiology

## 2015-04-15 MED ORDER — HYDROCHLOROTHIAZIDE 25 MG PO TABS
12.5000 mg | ORAL_TABLET | Freq: Every day | ORAL | Status: DC
Start: 2015-04-15 — End: 2015-11-18

## 2015-07-02 ENCOUNTER — Ambulatory Visit: Payer: Self-pay | Admitting: Obstetrics and Gynecology

## 2015-07-08 ENCOUNTER — Other Ambulatory Visit: Payer: Self-pay

## 2015-07-08 DIAGNOSIS — C189 Malignant neoplasm of colon, unspecified: Secondary | ICD-10-CM

## 2015-07-08 MED ORDER — CLOBETASOL PROPIONATE 0.05 % EX OINT: TOPICAL_OINTMENT | CUTANEOUS | Status: DC

## 2015-07-25 ENCOUNTER — Ambulatory Visit (INDEPENDENT_AMBULATORY_CARE_PROVIDER_SITE_OTHER): Payer: PPO | Admitting: Interventional Cardiology

## 2015-07-25 ENCOUNTER — Encounter: Payer: Self-pay | Admitting: Interventional Cardiology

## 2015-07-25 VITALS — BP 134/84 | HR 58 | Ht 62.0 in | Wt 121.0 lb

## 2015-07-25 DIAGNOSIS — I10 Essential (primary) hypertension: Secondary | ICD-10-CM

## 2015-07-25 DIAGNOSIS — Z8679 Personal history of other diseases of the circulatory system: Secondary | ICD-10-CM | POA: Diagnosis not present

## 2015-07-25 DIAGNOSIS — I2510 Atherosclerotic heart disease of native coronary artery without angina pectoris: Secondary | ICD-10-CM | POA: Diagnosis not present

## 2015-07-25 DIAGNOSIS — Z9889 Other specified postprocedural states: Secondary | ICD-10-CM

## 2015-07-25 NOTE — Patient Instructions (Signed)
Medication Instructions:  Your physician recommends that you continue on your current medications as directed. Please refer to the Current Medication list given to you today.   Labwork: None ordered  Testing/Procedures: None ordered  Follow-Up: Your physician wants you to follow-up in: 1 year with Dr.Nodal You will receive a reminder letter in the mail two months in advance. If you don't receive a letter, please call our office to schedule the follow-up appointment.   Any Other Special Instructions Will Be Listed Below (If Applicable).   

## 2015-07-25 NOTE — Progress Notes (Signed)
Cardiology Office Note   Date:  07/25/2015   ID:  Marie Burch, DOB 1934-07-28, MRN [id number]  PCP:  Mathews Argyle, MD  Cardiologist:  Sinclair Grooms, MD   Chief Complaint  Patient presents with  . Coronary Artery Disease      History of Present Illness: Marie Burch is a 81 y.o. female who presents for coronary artery disease with prior bypass surgery, mitral valve repair 2003, hyperlipidemia, hypertension, and chronic kidney disease.  Patient is doing relatively well. She has no cardiopulmonary complaints. She states that she cannot remember anything since she had the left hemicolectomy, appendectomy,. She has not had angina. She denies orthopnea, PND, exertional dyspnea, edema, and palpitations.  She seems depressed and somewhat confused. They now tell me Mr. Bencosme has laryngeal cancer.    Past Medical History  Diagnosis Date  . Blood transfusion 1960's    During Nephrectomy Procedure  . GERD (gastroesophageal reflux disease)   . Hyperlipidemia   . Anxiety   . Depression   . Arthritis   . Hypertension     dr Daneen Schick  . S/P radiation therapy 11/29/01 - 03/18/02    Right Breast: 5,040 cGy/28 Fractions with Boost for Total Dose  of 6,3000 cGy  . Complication of anesthesia     "I quit breathing" (patient reported it was due to PCN allergy though '60's)  . Coronary artery disease   . Heart murmur     s/p MV ring annuloplasty '03  . Chronic kidney disease     history of right nephrectomy due to stone '60's  . Cancer 2001 and 2002    breast, right  . Cancer of colon 08/03/12    Past Surgical History  Procedure Laterality Date  . Kidney removed  1960's  . Eye surgery       lump removed rt eye  . Catarects Bilateral few yrs ago  . Coronary artery bypass graft  2003    CABG X 2 (SVGs to DIAG and OM)/MV Repair '03  . Diagnostic laparoscopy    . Laparotomy  08/03/2012    Procedure: LAPAROTOMY for Left  Hemicolectomy   ;  Surgeon: Madilyn Hook, DO;   Location: [facility];  Service: General;  Laterality: N/A;  Mini Laparotomy  . Proctoscopy  08/03/2012    Procedure: PROCTOSCOPY;  Surgeon: Madilyn Hook, DO;  Location: [facility];  Service: General;  Laterality: N/A;  . Colon surgery  07-2012  . Breast surgery Right 2001  . Breast lumpectomy    . Appendectomy    . Appendectomy  08/03/2012    Procedure: APPENDECTOMY;  Surgeon: Madilyn Hook, DO;  Location: [facility];  Service: General;  Laterality: N/A;  incidental appendectomy  . Abdominal hysterectomy    . Colonoscopy with propofol N/A 08/08/2013    Procedure: COLONOSCOPY WITH PROPOFOL;  Surgeon: Garlan Fair, MD;  Location: [facility];  Service: Endoscopy;  Laterality: N/A;     Current Outpatient Prescriptions  Medication Sig Dispense Refill  . atenolol (TENORMIN) 25 MG tablet Take 25 mg by mouth every morning.     . clonazePAM (KLONOPIN) 0.5 MG tablet Take 0.25-0.5 mg by mouth at bedtime as needed. For sleep    . hydrochlorothiazide (HYDRODIURIL) 25 MG tablet Take 0.5 tablets (12.5 mg total) by mouth daily. 15 tablet 6  . HYDROcodone-acetaminophen (NORCO/VICODIN) 5-325 MG per tablet Take 1 tablet by mouth every 4 (four) hours as needed (pain). 40 tablet 0  . lidocaine (LIDODERM) 5 %  Place 0.5-1 patches onto the skin daily as needed. For back pain. Remove & Discard patch within 12 hours or as directed by MD    . LORazepam (ATIVAN) 0.5 MG tablet Take 0.5 mg by mouth daily as needed. anxiety    . Naproxen Sodium (ALEVE) 220 MG CAPS Take 220 mg by mouth daily as needed (for pain).     Marland Kitchen NITROSTAT 0.4 MG SL tablet TAKE 1 TABLET UNDER THE TONGUE AS NEEDEDFOR CHEST PAIN (MAY REPEAT EVERY 5 MIN X3 DOSES, CALL 911 IF NO RELIEF.) 25 tablet 5  . Omega-3 Fatty Acids (FISH OIL) 1000 MG CAPS Take 1,000 mg by mouth 2 (two) times daily.     Vladimir Faster Glycol-Propyl Glycol (SYSTANE ULTRA) 0.4-0.3 % SOLN Apply 1 drop to eye daily as needed (for blurry vision).    . polyethylene glycol (MIRALAX / GLYCOLAX) packet  Take 17 g by mouth daily as needed (for constipation).    . sertraline (ZOLOFT) 50 MG tablet Take 50 mg by mouth daily. Takes 1/2 of 50 mg tab daily    . traMADol (ULTRAM) 50 MG tablet Take 50 mg by mouth every 6 (six) hours as needed for pain.     Marland Kitchen ZOSTAVAX 08676 UNT/0.65ML injection      No current facility-administered medications for this visit.    Allergies:   Penicillins; Levaquin; Niacin and related; Norvasc; Novocain; Pravachol; Statins; Sulfa antibiotics; and Zetia    Social History:  The patient  reports that she has never smoked. She has never used smokeless tobacco. She reports that she does not drink alcohol or use illicit drugs.   Family History:  The patient's family history includes Heart disease in her father and mother.    ROS:  Please see the history of present illness.   Otherwise, review of systems are positive for constipation, leg pain, difficulty with balance, muscle pain, depression, and easy bruising..   All other systems are reviewed and negative.    PHYSICAL EXAM: VS:  BP 134/84 mmHg  Pulse 58  Ht 5\' 2"  (1.575 m)  Wt 54.885 kg (121 lb)  BMI 22.13 kg/m2 , BMI Body mass index is 22.13 kg/(m^2). GEN: Well nourished, well developed, in no acute distress HEENT: normal Neck: no JVD, carotid bruits, or masses Cardiac: RRR.  There is no murmur, rub, or gallop. There is no edema. Respiratory:  clear to auscultation bilaterally, normal work of breathing. GI: soft, nontender, nondistended, + BS MS: no deformity or atrophy Skin: warm and dry, no rash Neuro:  Strength and sensation are intact Psych: euthymic mood, full affect   EKG:  EKG normal sinus rhythm, old inferior infarct, otherwise unremarkable.   Recent Labs: No results found for requested labs within last 365 days.    Lipid Panel No results found for: CHOL, TRIG, HDL, CHOLHDL, VLDL, LDLCALC, LDLDIRECT    Wt Readings from Last 3 Encounters:  07/25/15 54.885 kg (121 lb)  02/28/15 58.605 kg  (129 lb 3.2 oz)  02/15/15 58.514 kg (129 lb)      Other studies Reviewed: Additional studies/ records that were reviewed today include: I reviewed old records. No alarms or concerning features are identified..    ASSESSMENT AND PLAN:  1. S/P mitral valve repair No evidence of mitral regurgitation  2. History of PSVT (paroxysmal supraventricular tachycardia) No clinical recurrences  3. Essential hypertension Controlled without excessive blood pressure elevation  4. Atherosclerosis of native coronary artery of native heart without angina pectoris Asymptomatic  Current medicines are reviewed at length with the patient today.  The patient has the following concerns regarding medicines: None.  The following changes/actions have been instituted:    Call if cardiopulmonary complaints  Labs/ tests ordered today include:  No orders of the defined types were placed in this encounter.     Disposition:   FU with HS in 1 year  Signed, Sinclair Grooms, MD  07/25/2015 3:36 PM    [facility] [address], [place], [place]  [address] Phone: [phone]; Fax: [phone]

## 2015-07-26 ENCOUNTER — Ambulatory Visit: Payer: PPO | Admitting: Podiatry

## 2015-08-08 ENCOUNTER — Ambulatory Visit: Payer: PPO | Admitting: Podiatry

## 2015-08-26 ENCOUNTER — Telehealth: Payer: Self-pay | Admitting: Oncology

## 2015-08-26 ENCOUNTER — Ambulatory Visit (HOSPITAL_BASED_OUTPATIENT_CLINIC_OR_DEPARTMENT_OTHER): Payer: PPO | Admitting: Oncology

## 2015-08-26 ENCOUNTER — Other Ambulatory Visit (HOSPITAL_BASED_OUTPATIENT_CLINIC_OR_DEPARTMENT_OTHER): Payer: PPO

## 2015-08-26 ENCOUNTER — Telehealth: Payer: Self-pay | Admitting: *Deleted

## 2015-08-26 VITALS — BP 146/75 | HR 59 | Temp 98.1°F | Resp 17 | Ht 62.0 in | Wt 120.2 lb

## 2015-08-26 DIAGNOSIS — Z85038 Personal history of other malignant neoplasm of large intestine: Secondary | ICD-10-CM

## 2015-08-26 DIAGNOSIS — C189 Malignant neoplasm of colon, unspecified: Secondary | ICD-10-CM

## 2015-08-26 DIAGNOSIS — Z853 Personal history of malignant neoplasm of breast: Secondary | ICD-10-CM | POA: Diagnosis not present

## 2015-08-26 NOTE — Telephone Encounter (Signed)
per pof to sch pt appt-gave pt copy of avs °

## 2015-08-26 NOTE — Progress Notes (Signed)
  [place] OFFICE PROGRESS NOTE   Diagnosis: Colon cancer   INTERVAL HISTORY:   Marie Burch returns as scheduled. She feels well. She reports soreness at the lateral aspect of the right breast. She wonders whether this is related to her "bra". She has intermittent constipation. No bleeding. She reports having a negative mammogram in February.  Objective:  Vital signs in last 24 hours:  Blood pressure 146/75, pulse 59, temperature 98.1 F (36.7 C), temperature source Oral, resp. rate 17, height 5\' 2"  (1.575 m), weight 120 lb 3.2 oz (54.522 kg), SpO2 98 %.    HEENT: Neck without mass Lymphatics: No cervical, supraclavicular, axillary, or inguinal nodes Resp: Lungs clear bilaterally Cardio: Regular rate and rhythm GI: No hepatosplenomegaly, no mass, nontender Vascular: No leg edema Breasts: Dense and nodular breast tissue bilaterally, no discrete mass     Lab Results:   Lab Results  Component Value Date   CEA 1.3 02/28/2015    Medications: I have reviewed the patient's current medications.  Assessment/Plan: 1. Stage III (T3 N1) moderately differentiated adenocarcinoma of the left colon status post laparoscopic left colectomy on 08/03/2012. Initiation of adjuvant Xeloda chemotherapy on 08/30/2012. Dose reduction beginning with cycle 3 due to hand-foot syndrome. She completed cycle 4 beginning 11/08/2012. She completed cycle 5 beginning 11/29/2012. She completed cycle 6 beginning on 12/20/2012. She began cycle 7 on 01/10/2013. Cycle 7 was prematurely discontinued on 01/16/2013 due to progressive hand-foot symptoms. She completed the eighth and final cycle of adjuvant Xeloda beginning 01/31/2013. Xeloda was dose reduced with cycle 8.  Negative surveillance colonoscopy 08/08/2013. 2. Hand-foot syndrome secondary to Xeloda. Increased during cycle 7 with redness and pain at the feet. Xeloda was dose reduced with cycle 8 with improvement in the symptoms of hand-foot  syndrome. 3. Elevated preoperative CEA.  4. Status post right nephrectomy in the 1960s for management of a "stone". 5. History of breast cancer. 6. History of coronary artery disease status post coronary artery bypass surgery in 2003. 7. Incomplete preoperative colonoscopy secondary to obstruction. Negative colonoscopy 08/08/2013 8. History of hypertension. 9. Patient detected "lump" right breast reported when here on 01/26/2013. She is status post mammogram and right breast ultrasound 02/09/2013 with the palpable lump correlating with the dystrophic calcification in the right breast in the upper outer quadrant. 10. Memory loss-? Alzheimers, followed by Dr. Felipa Eth   Disposition:  [name] remains in clinical remission from colon cancer. We will follow-up on the mammogram from February. She will return for a repeat exam of the right breast in 3 months. We will follow-up on the CEA from today.  Betsy Coder, MD  08/26/2015  8:37 AM

## 2015-08-26 NOTE — Telephone Encounter (Signed)
Requested 12/2014 mammogram report from [facility]. Fax received, results to Dr. Gearldine Shown desk for review.

## 2015-08-27 ENCOUNTER — Telehealth: Payer: Self-pay | Admitting: *Deleted

## 2015-08-27 LAB — CEA: CEA: 1 ng/mL (ref 0.0–5.0)

## 2015-08-27 NOTE — Telephone Encounter (Signed)
Called pt with normal CEA results. She voiced understanding. 

## 2015-08-27 NOTE — Telephone Encounter (Signed)
-----   Message from Ladell Pier, MD sent at 08/27/2015  8:52 AM EDT ----- Please call patient, cea is normal

## 2015-09-09 ENCOUNTER — Encounter: Payer: Self-pay | Admitting: Oncology

## 2015-09-09 NOTE — Progress Notes (Signed)
I mailed claim forms to patient. I advised itemized bills come from billing and I included billing ph#

## 2015-11-18 ENCOUNTER — Other Ambulatory Visit: Payer: Self-pay | Admitting: Interventional Cardiology

## 2015-11-26 ENCOUNTER — Telehealth: Payer: Self-pay | Admitting: Oncology

## 2015-11-26 ENCOUNTER — Telehealth: Payer: Self-pay | Admitting: Nurse Practitioner

## 2015-11-26 ENCOUNTER — Ambulatory Visit: Payer: PPO | Admitting: Nurse Practitioner

## 2015-11-26 NOTE — Telephone Encounter (Signed)
pt cld to cx appt-stated will call @ a later date to r/s

## 2015-11-26 NOTE — Telephone Encounter (Signed)
pt cld lerft voicemail-cld pt back and left message to call us back-no detailed message left

## 2016-02-03 DIAGNOSIS — I129 Hypertensive chronic kidney disease with stage 1 through stage 4 chronic kidney disease, or unspecified chronic kidney disease: Secondary | ICD-10-CM | POA: Diagnosis not present

## 2016-02-03 DIAGNOSIS — N183 Chronic kidney disease, stage 3 (moderate): Secondary | ICD-10-CM | POA: Diagnosis not present

## 2016-02-03 DIAGNOSIS — Z79899 Other long term (current) drug therapy: Secondary | ICD-10-CM | POA: Diagnosis not present

## 2016-02-03 DIAGNOSIS — L989 Disorder of the skin and subcutaneous tissue, unspecified: Secondary | ICD-10-CM | POA: Diagnosis not present

## 2016-02-03 DIAGNOSIS — E7800 Pure hypercholesterolemia, unspecified: Secondary | ICD-10-CM | POA: Diagnosis not present

## 2016-02-03 DIAGNOSIS — F325 Major depressive disorder, single episode, in full remission: Secondary | ICD-10-CM | POA: Diagnosis not present

## 2016-02-03 DIAGNOSIS — Z0000 Encounter for general adult medical examination without abnormal findings: Secondary | ICD-10-CM | POA: Diagnosis not present

## 2016-02-03 DIAGNOSIS — R413 Other amnesia: Secondary | ICD-10-CM | POA: Diagnosis not present

## 2016-02-07 DIAGNOSIS — C44519 Basal cell carcinoma of skin of other part of trunk: Secondary | ICD-10-CM | POA: Diagnosis not present

## 2016-02-07 DIAGNOSIS — L821 Other seborrheic keratosis: Secondary | ICD-10-CM | POA: Diagnosis not present

## 2016-02-07 DIAGNOSIS — Z85828 Personal history of other malignant neoplasm of skin: Secondary | ICD-10-CM | POA: Diagnosis not present

## 2016-04-28 DIAGNOSIS — K5901 Slow transit constipation: Secondary | ICD-10-CM | POA: Diagnosis not present

## 2016-04-28 DIAGNOSIS — I129 Hypertensive chronic kidney disease with stage 1 through stage 4 chronic kidney disease, or unspecified chronic kidney disease: Secondary | ICD-10-CM | POA: Diagnosis not present

## 2016-04-28 DIAGNOSIS — R413 Other amnesia: Secondary | ICD-10-CM | POA: Diagnosis not present

## 2016-04-28 DIAGNOSIS — R1013 Epigastric pain: Secondary | ICD-10-CM | POA: Diagnosis not present

## 2016-04-28 DIAGNOSIS — E7800 Pure hypercholesterolemia, unspecified: Secondary | ICD-10-CM | POA: Diagnosis not present

## 2016-04-28 DIAGNOSIS — R634 Abnormal weight loss: Secondary | ICD-10-CM | POA: Diagnosis not present

## 2016-04-28 DIAGNOSIS — N183 Chronic kidney disease, stage 3 (moderate): Secondary | ICD-10-CM | POA: Diagnosis not present

## 2016-04-29 DIAGNOSIS — Q600 Renal agenesis, unilateral: Secondary | ICD-10-CM | POA: Diagnosis not present

## 2016-04-29 DIAGNOSIS — R197 Diarrhea, unspecified: Secondary | ICD-10-CM | POA: Diagnosis not present

## 2016-04-29 DIAGNOSIS — K7689 Other specified diseases of liver: Secondary | ICD-10-CM | POA: Diagnosis not present

## 2016-04-29 DIAGNOSIS — C189 Malignant neoplasm of colon, unspecified: Secondary | ICD-10-CM | POA: Diagnosis not present

## 2016-04-29 DIAGNOSIS — C50919 Malignant neoplasm of unspecified site of unspecified female breast: Secondary | ICD-10-CM | POA: Diagnosis not present

## 2016-06-09 DIAGNOSIS — S80819A Abrasion, unspecified lower leg, initial encounter: Secondary | ICD-10-CM | POA: Diagnosis not present

## 2016-06-09 DIAGNOSIS — R634 Abnormal weight loss: Secondary | ICD-10-CM | POA: Diagnosis not present

## 2016-06-09 DIAGNOSIS — I129 Hypertensive chronic kidney disease with stage 1 through stage 4 chronic kidney disease, or unspecified chronic kidney disease: Secondary | ICD-10-CM | POA: Diagnosis not present

## 2016-06-09 DIAGNOSIS — N183 Chronic kidney disease, stage 3 (moderate): Secondary | ICD-10-CM | POA: Diagnosis not present

## 2016-07-16 ENCOUNTER — Other Ambulatory Visit: Payer: Self-pay | Admitting: Interventional Cardiology

## 2016-08-05 DIAGNOSIS — Z23 Encounter for immunization: Secondary | ICD-10-CM | POA: Diagnosis not present

## 2016-08-05 DIAGNOSIS — Z79899 Other long term (current) drug therapy: Secondary | ICD-10-CM | POA: Diagnosis not present

## 2016-08-05 DIAGNOSIS — R0789 Other chest pain: Secondary | ICD-10-CM | POA: Diagnosis not present

## 2016-08-05 DIAGNOSIS — N183 Chronic kidney disease, stage 3 (moderate): Secondary | ICD-10-CM | POA: Diagnosis not present

## 2016-08-05 DIAGNOSIS — R269 Unspecified abnormalities of gait and mobility: Secondary | ICD-10-CM | POA: Diagnosis not present

## 2016-08-05 DIAGNOSIS — I129 Hypertensive chronic kidney disease with stage 1 through stage 4 chronic kidney disease, or unspecified chronic kidney disease: Secondary | ICD-10-CM | POA: Diagnosis not present

## 2016-08-12 DIAGNOSIS — R2681 Unsteadiness on feet: Secondary | ICD-10-CM | POA: Diagnosis not present

## 2016-08-12 DIAGNOSIS — R262 Difficulty in walking, not elsewhere classified: Secondary | ICD-10-CM | POA: Diagnosis not present

## 2016-08-14 ENCOUNTER — Other Ambulatory Visit: Payer: Self-pay | Admitting: Interventional Cardiology

## 2016-08-14 DIAGNOSIS — R2681 Unsteadiness on feet: Secondary | ICD-10-CM | POA: Diagnosis not present

## 2016-08-14 DIAGNOSIS — R262 Difficulty in walking, not elsewhere classified: Secondary | ICD-10-CM | POA: Diagnosis not present

## 2016-08-19 DIAGNOSIS — R2681 Unsteadiness on feet: Secondary | ICD-10-CM | POA: Diagnosis not present

## 2016-08-19 DIAGNOSIS — R262 Difficulty in walking, not elsewhere classified: Secondary | ICD-10-CM | POA: Diagnosis not present

## 2016-08-26 DIAGNOSIS — R2681 Unsteadiness on feet: Secondary | ICD-10-CM | POA: Diagnosis not present

## 2016-08-26 DIAGNOSIS — R262 Difficulty in walking, not elsewhere classified: Secondary | ICD-10-CM | POA: Diagnosis not present

## 2016-09-02 ENCOUNTER — Other Ambulatory Visit: Payer: Self-pay | Admitting: Interventional Cardiology

## 2016-09-07 NOTE — Progress Notes (Signed)
CARDIOLOGY OFFICE NOTE  Date:  09/08/2016    Marie Burch Date of Birth: 1934-10-24 Medical Record [id number]  PCP:  Mathews Argyle, MD  Cardiologist:  Jennings Books  Chief Complaint  Patient presents with  . Coronary Artery Disease    1 year check - seen for Dr. Tamala Julian    History of Present Illness: Marie Burch is a 82 y.o. female who presents today for a one year check. Seen for Dr. Tamala Julian.   She has a history of coronary artery disease with prior bypass surgery in 2003, mitral valve repair 2003, hyperlipidemia, hypertension, PSVT and chronic kidney disease.  Seen a year ago - noted to be depressed and somewhat confused. Cardiac status seemed to be ok.   Comes in today. Here with her son-in-law. She is doing ok. Was wanting to see Dr.Turnbough today - explained that he is not here in the office today. Rambling - talking about the past. Says she is doing ok. She says she is not having chest pain - but family notes chest pain 3 days ago - says this was gas. Not really short of breath. She has had some falls. On chronic narcotic - not really clear to me as to why. Husband died back in 01/27/23. Not very active. Some memory issues. Not cooking and eating out more. Weight is down. Not really clear if she is having dizziness. Does have some balance issues.   Past Medical History:  Diagnosis Date  . Anxiety   . Arthritis   . Blood transfusion 1960's   During Nephrectomy Procedure  . Cancer [facility]) 2001 and 2002   breast, right  . Cancer of colon ([place]) 08/03/12  . Chronic kidney disease    history of right nephrectomy due to stone '60's  . Complication of anesthesia    "I quit breathing" (patient reported it was due to PCN allergy though '60's)  . Coronary artery disease   . Depression   . GERD (gastroesophageal reflux disease)   . Heart murmur    s/p MV ring annuloplasty '03  . Hyperlipidemia   . Hypertension    dr Daneen Schick  . S/P radiation therapy 11/29/01 -  03/18/02   Right Breast: 5,040 cGy/28 Fractions with Boost for Total Dose  of 6,3000 cGy    Past Surgical History:  Procedure Laterality Date  . ABDOMINAL HYSTERECTOMY    . APPENDECTOMY    . APPENDECTOMY  08/03/2012   Procedure: APPENDECTOMY;  Surgeon: Madilyn Hook, DO;  Location: [place];  Service: General;  Laterality: N/A;  incidental appendectomy  . BREAST LUMPECTOMY    . BREAST SURGERY Right 2001  . catarects Bilateral few yrs ago  . COLON SURGERY  07-2012  . COLONOSCOPY WITH PROPOFOL N/A 08/08/2013   Procedure: COLONOSCOPY WITH PROPOFOL;  Surgeon: Garlan Fair, MD;  [facility];  Service: Endoscopy;  Laterality: N/A;  . CORONARY ARTERY BYPASS GRAFT  2003   CABG X 2 (SVGs to DIAG and OM)/MV Repair '03  . DIAGNOSTIC LAPAROSCOPY    . EYE SURGERY      lump removed rt eye  . kidney removed  1960's  . LAPAROTOMY  08/03/2012   Procedure: LAPAROTOMY for Left  Hemicolectomy   ;  Surgeon: Madilyn Hook, DO;  Location: [place];  Service: General;  Laterality: N/A;  Mini Laparotomy  . PROCTOSCOPY  08/03/2012   Procedure: PROCTOSCOPY;  Surgeon: Madilyn Hook, DO;  Location: [place];  Service: General;  Laterality: N/A;     Medications: Current Outpatient Prescriptions  Medication Sig Dispense Refill  . aspirin EC 81 MG tablet Take 81 mg by mouth daily.    Marland Kitchen atenolol (TENORMIN) 25 MG tablet Take 25 mg by mouth every morning.     . clonazePAM (KLONOPIN) 0.5 MG tablet Take 0.25-0.5 mg by mouth at bedtime as needed. For sleep    . donepezil (ARICEPT) 5 MG tablet Take 5 mg by mouth at bedtime.     . hydrochlorothiazide (HYDRODIURIL) 25 MG tablet TAKE 1/2 TABLET BY MOUTH DAILY 15 tablet 0  . HYDROcodone-acetaminophen (NORCO/VICODIN) 5-325 MG per tablet Take 1 tablet by mouth every 4 (four) hours as needed (pain). 40 tablet 0  . lactose free nutrition (BOOST) LIQD Take 237 mLs by mouth daily.    Marland Kitchen lidocaine (LIDODERM) 5 % Place 0.5-1 patches onto the skin daily as needed. For back pain.  Remove & Discard patch within 12 hours or as directed by MD    . LORazepam (ATIVAN) 0.5 MG tablet Take 0.5 mg by mouth daily as needed. anxiety    . Multiple Vitamin (MULTIVITAMIN) tablet Take 1 tablet by mouth daily.    . Naproxen Sodium (ALEVE) 220 MG CAPS Take 220 mg by mouth daily as needed (for pain).     Marland Kitchen NITROSTAT 0.4 MG SL tablet TAKE 1 TABLET UNDER THE TONGUE AS NEEDEDFOR CHEST PAIN (MAY REPEAT EVERY 5 MIN X3 DOSES, CALL 911 IF NO RELIEF.) 25 tablet 5  . Omega-3 Fatty Acids (FISH OIL) 1000 MG CAPS Take 1,000 mg by mouth 2 (two) times daily.     Vladimir Faster Glycol-Propyl Glycol (SYSTANE ULTRA) 0.4-0.3 % SOLN Apply 1 drop to eye daily as needed (for blurry vision).    . polyethylene glycol (MIRALAX / GLYCOLAX) packet Take 17 g by mouth daily as needed (for constipation).    . sertraline (ZOLOFT) 100 MG tablet Take 100 mg by mouth daily.    . traMADol (ULTRAM) 50 MG tablet Take 50 mg by mouth every 6 (six) hours as needed for pain.      No current facility-administered medications for this visit.     Allergies: Allergies  Allergen Reactions  . Penicillins Anaphylaxis  . Levaquin [Levofloxacin] Other (See Comments)    PT TOLD TO LEAVE ANTIBIOTICS ALONE AFTER NEPHRECTOMY PT HAD REACTION TO ANTIBIOTICS  . Niacin And Related Other (See Comments)    unknown  . Norvasc [Amlodipine Besylate] Other (See Comments)    Unknown, PT NOT SURE  . Novocain [Procaine Hcl]     Numbness would not leave   . Pravachol [Pravastatin Sodium] Other (See Comments)    Unknown, ? Questionable muscle aches  . Statins Other (See Comments)    Unknown, questionable muscle aches  . Sulfa Antibiotics   . Zetia [Ezetimibe] Other (See Comments)    Unknown, pt not sure    Social History: The patient  reports that she has never smoked. She has never used smokeless tobacco. She reports that she does not drink alcohol or use drugs.   Family History: The patient's family history includes Heart disease in her  father and mother.   Review of Systems: Please see the history of present illness.   Otherwise, the review of systems is positive for none.   All other systems are reviewed and negative.   Physical Exam: VS:  BP 102/78   Pulse 60   Ht 5' (1.524 m)   Wt 109 lb 12.8 oz (49.8 kg)  BMI 21.44 kg/m  .  BMI Body mass index is 21.44 kg/m.  Wt Readings from Last 3 Encounters:  09/08/16 109 lb 12.8 oz (49.8 kg)  08/26/15 120 lb 3.2 oz (54.5 kg)  07/25/15 121 lb (54.9 kg)    General:  Seems frail. Alert and in no acute distress. Looks to her family member for answers. Weight down 11 pounds.   HEENT: Normal.  Neck: Supple, no JVD, carotid bruits, or masses noted.  Cardiac: Regular rate and rhythm. No murmurs, rubs, or gallops. No edema.  Respiratory:  Lungs are clear to auscultation bilaterally with normal work of breathing.  GI: Soft and nontender.  MS: No deformity or atrophy. Gait and ROM intact.  Skin: Warm and dry. Color is normal.  Neuro:  Strength and sensation are intact and no gross focal deficits noted.  Psych: Alert, appropriate and with normal affect.   LABORATORY DATA:  EKG:  EKG is ordered today. This demonstrates NSR - rate is 60.  Lab Results  Component Value Date   WBC 6.3 02/16/2013   HGB 13.4 02/16/2013   HCT 39.4 02/16/2013   PLT 189 02/16/2013   GLUCOSE 125 (H) 02/16/2013   ALT 21 02/16/2013   AST 28 02/16/2013   NA 143 02/16/2013   K 4.0 02/16/2013   CL 103 02/16/2013   CREATININE 1.0 02/16/2013   BUN 15.6 02/16/2013   CO2 30 (H) 02/16/2013    BNP (last 3 results) No results for input(s): BNP in the last 8760 hours.  ProBNP (last 3 results) No results for input(s): PROBNP in the last 8760 hours.   Other Studies Reviewed Today:   Assessment/Plan:  1. HTN - BP much lower - weight down 11 pounds. ?dizziness - would prefer her BP to be higher - stopping HCTZ today. Will see back in about 4 to 6 weeks - may need further med changes.   2.  Ischemic heart disease - favor conservative medical management.   3. S/P mitral valve repair No evidence of mitral regurgitation  4. History of PSVT (paroxysmal supraventricular tachycardia) No clinical recurrences  5. Memory issue - now on Aricept - explained that current goals include keeping her safe/minimizing falls, etc.   6. Situational stress with husband's death.   Current medicines are reviewed with the patient today.  The patient does not have concerns regarding medicines other than what has been noted above.  The following changes have been made:  See above.  Labs/ tests ordered today include:   No orders of the defined types were placed in this encounter.    Disposition:   FU with  Patient is agreeable to this plan and will call if any problems develop in the interim.   Signed: Burtis Junes, RN, ANP-C 09/08/2016 9:51 AM  [facility] [address] [place], [place]  [address] Phone: [phone] Fax: [phone]

## 2016-09-08 ENCOUNTER — Ambulatory Visit (INDEPENDENT_AMBULATORY_CARE_PROVIDER_SITE_OTHER): Payer: PPO | Admitting: Nurse Practitioner

## 2016-09-08 ENCOUNTER — Encounter: Payer: Self-pay | Admitting: Nurse Practitioner

## 2016-09-08 VITALS — BP 102/78 | HR 60 | Ht 60.0 in | Wt 109.8 lb

## 2016-09-08 DIAGNOSIS — I259 Chronic ischemic heart disease, unspecified: Secondary | ICD-10-CM | POA: Diagnosis not present

## 2016-09-08 DIAGNOSIS — E7800 Pure hypercholesterolemia, unspecified: Secondary | ICD-10-CM

## 2016-09-08 DIAGNOSIS — I10 Essential (primary) hypertension: Secondary | ICD-10-CM | POA: Diagnosis not present

## 2016-09-08 NOTE — Patient Instructions (Addendum)
We will be checking the following labs today - NONE   Medication Instructions:    Continue with your current medicines. BUT  I am stopping the HCTZ    Testing/Procedures To Be Arranged:  N/A  Follow-Up:   See me in about 4 to 6 weeks - try for a day that Dr. Tamala Julian is here.     Other Special Instructions:   N/A    If you need a refill on your cardiac medications before your next appointment, please call your pharmacy.   Call the [place] office at [phone] if you have any questions, problems or concerns.

## 2016-09-09 DIAGNOSIS — H43813 Vitreous degeneration, bilateral: Secondary | ICD-10-CM | POA: Diagnosis not present

## 2016-09-09 DIAGNOSIS — H01025 Squamous blepharitis left lower eyelid: Secondary | ICD-10-CM | POA: Diagnosis not present

## 2016-09-09 DIAGNOSIS — H01021 Squamous blepharitis right upper eyelid: Secondary | ICD-10-CM | POA: Diagnosis not present

## 2016-09-09 DIAGNOSIS — H35371 Puckering of macula, right eye: Secondary | ICD-10-CM | POA: Diagnosis not present

## 2016-09-09 DIAGNOSIS — H01022 Squamous blepharitis right lower eyelid: Secondary | ICD-10-CM | POA: Diagnosis not present

## 2016-09-09 DIAGNOSIS — Z961 Presence of intraocular lens: Secondary | ICD-10-CM | POA: Diagnosis not present

## 2016-09-09 DIAGNOSIS — H01024 Squamous blepharitis left upper eyelid: Secondary | ICD-10-CM | POA: Diagnosis not present

## 2016-09-09 DIAGNOSIS — H532 Diplopia: Secondary | ICD-10-CM | POA: Diagnosis not present

## 2016-09-16 DIAGNOSIS — M5136 Other intervertebral disc degeneration, lumbar region: Secondary | ICD-10-CM | POA: Diagnosis not present

## 2016-09-16 DIAGNOSIS — M1711 Unilateral primary osteoarthritis, right knee: Secondary | ICD-10-CM | POA: Diagnosis not present

## 2016-09-16 DIAGNOSIS — M1712 Unilateral primary osteoarthritis, left knee: Secondary | ICD-10-CM | POA: Diagnosis not present

## 2016-09-16 DIAGNOSIS — M545 Low back pain: Secondary | ICD-10-CM | POA: Diagnosis not present

## 2016-10-20 NOTE — Progress Notes (Signed)
CARDIOLOGY OFFICE NOTE  Date:  10/21/2016    Marie Burch Date of Birth: [birth year] Medical Record [id number]  PCP:  Mathews Argyle, MD  Cardiologist:  Jennings Books    Chief Complaint  Patient presents with  . Hypertension  . Coronary Artery Disease    Follow up visit - seen for Dr. Tamala Julian    History of Present Illness: Marie Burch is a 82 y.o. female who presents today for a follow up visit. Seen for Dr. Tamala Julian.   She has a history of coronary artery disease with prior bypass surgery in 2003, mitral valve repair 2003, hyperlipidemia, hypertension, PSVT and chronic kidney disease.  Seen a year ago - noted to be depressed and somewhat confused. Cardiac status seemed to be ok.   I saw her towards the end of October - BP low. Dizzy. Husband had died last 2023/01/11. Lost weight. I stopped her HCTZ.   Comes in today. Here with her son-in-law. She looks to him for most of her answers. Not dizzy any more. BP fine at home. She feels ok. No swelling. No chest pain.   Past Medical History:  Diagnosis Date  . Anxiety   . Arthritis   . Blood transfusion 1960's   During Nephrectomy Procedure  . Cancer [facility]) 2001 and 2002   breast, right  . Cancer of colon ([place]) 08/03/12  . Chronic kidney disease    history of right nephrectomy due to stone '60's  . Complication of anesthesia    "I quit breathing" (patient reported it was due to PCN allergy though '60's)  . Coronary artery disease   . Depression   . GERD (gastroesophageal reflux disease)   . Heart murmur    s/p MV ring annuloplasty '03  . Hyperlipidemia   . Hypertension    dr Daneen Schick  . S/P radiation therapy 11/29/01 - 03/18/02   Right Breast: 5,040 cGy/28 Fractions with Boost for Total Dose  of 6,3000 cGy    Past Surgical History:  Procedure Laterality Date  . ABDOMINAL HYSTERECTOMY    . APPENDECTOMY    . APPENDECTOMY  08/03/2012   Procedure: APPENDECTOMY;  Surgeon: Madilyn Hook, DO;  Location: [place];  Service: General;  Laterality: N/A;  incidental appendectomy  . BREAST LUMPECTOMY    . BREAST SURGERY Right 2001  . catarects Bilateral few yrs ago  . COLON SURGERY  07-2012  . COLONOSCOPY WITH PROPOFOL N/A 08/08/2013   Procedure: COLONOSCOPY WITH PROPOFOL;  Surgeon: Garlan Fair, MD;  [facility];  Service: Endoscopy;  Laterality: N/A;  . CORONARY ARTERY BYPASS GRAFT  2003   CABG X 2 (SVGs to DIAG and OM)/MV Repair '03  . DIAGNOSTIC LAPAROSCOPY    . EYE SURGERY      lump removed rt eye  . kidney removed  1960's  . LAPAROTOMY  08/03/2012   Procedure: LAPAROTOMY for Left  Hemicolectomy   ;  Surgeon: Madilyn Hook, DO;  Location: [place];  Service: General;  Laterality: N/A;  Mini Laparotomy  . PROCTOSCOPY  08/03/2012   Procedure: PROCTOSCOPY;  Surgeon: Madilyn Hook, DO;  Location: [place];  Service: General;  Laterality: N/A;     Medications: Current Outpatient Prescriptions  Medication Sig Dispense Refill  . aspirin EC 81 MG tablet Take 81 mg by mouth daily.    Marland Kitchen atenolol (TENORMIN) 25 MG tablet Take 25 mg by mouth every morning.     . clonazePAM (KLONOPIN) 0.5 MG tablet  Take 0.25-0.5 mg by mouth at bedtime as needed. For sleep    . donepezil (ARICEPT) 10 MG tablet     . HYDROcodone-acetaminophen (NORCO/VICODIN) 5-325 MG per tablet Take 1 tablet by mouth every 4 (four) hours as needed (pain). 40 tablet 0  . lactose free nutrition (BOOST) LIQD Take 237 mLs by mouth daily.    Marland Kitchen lidocaine (LIDODERM) 5 % Place 0.5-1 patches onto the skin daily as needed. For back pain. Remove & Discard patch within 12 hours or as directed by MD    . LORazepam (ATIVAN) 0.5 MG tablet Take 0.5 mg by mouth every 8 (eight) hours as needed for anxiety. anxiety    . Multiple Vitamin (MULTIVITAMIN) tablet Take 1 tablet by mouth daily.    . Naproxen Sodium (ALEVE) 220 MG CAPS Take 220 mg by mouth daily as needed (for pain).     Marland Kitchen NITROSTAT 0.4 MG SL tablet TAKE 1 TABLET UNDER THE TONGUE AS NEEDEDFOR  CHEST PAIN (MAY REPEAT EVERY 5 MIN X3 DOSES, CALL 911 IF NO RELIEF.) 25 tablet 5  . Omega-3 Fatty Acids (FISH OIL) 1000 MG CAPS Take 1,000 mg by mouth 2 (two) times daily.     Vladimir Faster Glycol-Propyl Glycol (SYSTANE ULTRA) 0.4-0.3 % SOLN Apply 1 drop to eye daily as needed (for blurry vision).    . polyethylene glycol (MIRALAX / GLYCOLAX) packet Take 17 g by mouth daily as needed (for constipation).    . sertraline (ZOLOFT) 100 MG tablet Take 100 mg by mouth daily.    . traMADol (ULTRAM) 50 MG tablet Take 50 mg by mouth every 6 (six) hours as needed for pain.      No current facility-administered medications for this visit.     Allergies: Allergies  Allergen Reactions  . Penicillins Anaphylaxis  . Levaquin [Levofloxacin] Other (See Comments)    PT TOLD TO LEAVE ANTIBIOTICS ALONE AFTER NEPHRECTOMY PT HAD REACTION TO ANTIBIOTICS  . Niacin And Related Other (See Comments)    unknown  . Norvasc [Amlodipine Besylate] Other (See Comments)    Unknown, PT NOT SURE  . Novocain [Procaine Hcl]     Numbness would not leave   . Pravachol [Pravastatin Sodium] Other (See Comments)    Unknown, ? Questionable muscle aches  . Statins Other (See Comments)    Unknown, questionable muscle aches  . Sulfa Antibiotics   . Zetia [Ezetimibe] Other (See Comments)    Unknown, pt not sure    Social History: The patient  reports that she has never smoked. She has never used smokeless tobacco. She reports that she does not drink alcohol or use drugs.   Family History: The patient's family history includes Heart disease in her father and mother.   Review of Systems: Please see the history of present illness.   Otherwise, the review of systems is positive for none.   All other systems are reviewed and negative.   Physical Exam: VS:  BP (!) 146/88   Pulse (!) 56   Ht 5\' 2"  (1.575 m)   Wt 109 lb 12.8 oz (49.8 kg)   BMI 20.08 kg/m  .  BMI Body mass index is 20.08 kg/m.  Wt Readings from Last 3  Encounters:  10/21/16 109 lb 12.8 oz (49.8 kg)  09/08/16 109 lb 12.8 oz (49.8 kg)  08/26/15 120 lb 3.2 oz (54.5 kg)    General: Pleasant. Well developed, well nourished and in no acute distress.   HEENT: Normal.  Neck: Supple, no JVD,  carotid bruits, or masses noted.  Cardiac: Regular rate and rhythm. No murmurs, rubs, or gallops. No edema.  Respiratory:  Lungs are clear to auscultation bilaterally with normal work of breathing.  GI: Soft and nontender.  MS: No deformity or atrophy. Gait and ROM intact.  Skin: Warm and dry. Color is normal.  Neuro:  Strength and sensation are intact and no gross focal deficits noted.  Psych: Alert, appropriate and with normal affect.   LABORATORY DATA:  EKG:  EKG is not ordered today.  Lab Results  Component Value Date   WBC 6.3 02/16/2013   HGB 13.4 02/16/2013   HCT 39.4 02/16/2013   PLT 189 02/16/2013   GLUCOSE 125 (H) 02/16/2013   ALT 21 02/16/2013   AST 28 02/16/2013   NA 143 02/16/2013   K 4.0 02/16/2013   CL 103 02/16/2013   CREATININE 1.0 02/16/2013   BUN 15.6 02/16/2013   CO2 30 (H) 02/16/2013    BNP (last 3 results) No results for input(s): BNP in the last 8760 hours.  ProBNP (last 3 results) No results for input(s): PROBNP in the last 8760 hours.   Other Studies Reviewed Today:   Assessment/Plan: 1. HTN - BP was much lower at last visit and she was down 11 pounds and subsequently dizzy - I stopped the HCTZ. This is now resolved. No further med changes needed at this time. Subsequently seen with Dr. Tamala Julian who is in agreement.   2. Ischemic heart disease - favor conservative medical management. No symptoms.   3. S/P mitral valve repair No evidence of mitral regurgitation  4. History of PSVT (paroxysmal supraventricular tachycardia) No clinical recurrences  5. Memory issue - now on Aricept  6. Situational stress with husband's death.   Current medicines are reviewed with the patient today.  The patient does  not have concerns regarding medicines other than what has been noted above.  The following changes have been made:  See above.  Labs/ tests ordered today include:   No orders of the defined types were placed in this encounter.    Disposition:   FU with Dr. Tamala Julian in 6 months.    Patient is agreeable to this plan and will call if any problems develop in the interim.   Signed: Burtis Junes, RN, ANP-C 10/21/2016 2:59 PM  [facility] [address] [place], [place]  [address] Phone: [phone] Fax: [phone]

## 2016-10-21 ENCOUNTER — Encounter: Payer: Self-pay | Admitting: Nurse Practitioner

## 2016-10-21 ENCOUNTER — Ambulatory Visit (INDEPENDENT_AMBULATORY_CARE_PROVIDER_SITE_OTHER): Payer: PPO | Admitting: Nurse Practitioner

## 2016-10-21 VITALS — BP 146/88 | HR 56 | Ht 62.0 in | Wt 109.8 lb

## 2016-10-21 DIAGNOSIS — E7800 Pure hypercholesterolemia, unspecified: Secondary | ICD-10-CM | POA: Diagnosis not present

## 2016-10-21 DIAGNOSIS — I259 Chronic ischemic heart disease, unspecified: Secondary | ICD-10-CM

## 2016-10-21 DIAGNOSIS — I10 Essential (primary) hypertension: Secondary | ICD-10-CM | POA: Diagnosis not present

## 2016-10-21 NOTE — Patient Instructions (Addendum)
We will be checking the following labs today - NONE  Medication Instructions:    Continue with your current medicines.     Testing/Procedures To Be Arranged:  N/A  Follow-Up:   See Dr. North in 6 months.     Other Special Instructions:   N/A    If you need a refill on your cardiac medications before your next appointment, please call your pharmacy.   Call the [facility] office at [phone] if you have any questions, problems or concerns.      

## 2016-10-30 ENCOUNTER — Emergency Department: Payer: PPO

## 2016-10-30 ENCOUNTER — Emergency Department
Admission: EM | Admit: 2016-10-30 | Discharge: 2016-10-30 | Disposition: A | Discharge status: Home / Self Care | Payer: PPO | Attending: Student in an Organized Health Care Education/Training Program | Admitting: Student in an Organized Health Care Education/Training Program

## 2016-10-30 DIAGNOSIS — Z79899 Other long term (current) drug therapy: Secondary | ICD-10-CM | POA: Insufficient documentation

## 2016-10-30 DIAGNOSIS — Z23 Encounter for immunization: Secondary | ICD-10-CM | POA: Insufficient documentation

## 2016-10-30 DIAGNOSIS — W1809XA Striking against other object with subsequent fall, initial encounter: Secondary | ICD-10-CM | POA: Diagnosis not present

## 2016-10-30 DIAGNOSIS — S0303XA Dislocation of jaw, bilateral, initial encounter: Secondary | ICD-10-CM | POA: Diagnosis not present

## 2016-10-30 DIAGNOSIS — Z7982 Long term (current) use of aspirin: Secondary | ICD-10-CM | POA: Diagnosis not present

## 2016-10-30 DIAGNOSIS — Y999 Unspecified external cause status: Secondary | ICD-10-CM | POA: Insufficient documentation

## 2016-10-30 DIAGNOSIS — Y9301 Activity, walking, marching and hiking: Secondary | ICD-10-CM | POA: Insufficient documentation

## 2016-10-30 DIAGNOSIS — I129 Hypertensive chronic kidney disease with stage 1 through stage 4 chronic kidney disease, or unspecified chronic kidney disease: Secondary | ICD-10-CM | POA: Diagnosis not present

## 2016-10-30 DIAGNOSIS — S199XXA Unspecified injury of neck, initial encounter: Secondary | ICD-10-CM | POA: Diagnosis not present

## 2016-10-30 DIAGNOSIS — N3000 Acute cystitis without hematuria: Secondary | ICD-10-CM | POA: Insufficient documentation

## 2016-10-30 DIAGNOSIS — Y92481 Parking lot as the place of occurrence of the external cause: Secondary | ICD-10-CM | POA: Insufficient documentation

## 2016-10-30 DIAGNOSIS — Z85038 Personal history of other malignant neoplasm of large intestine: Secondary | ICD-10-CM | POA: Diagnosis not present

## 2016-10-30 DIAGNOSIS — S0003XA Contusion of scalp, initial encounter: Secondary | ICD-10-CM | POA: Diagnosis not present

## 2016-10-30 DIAGNOSIS — I25810 Atherosclerosis of coronary artery bypass graft(s) without angina pectoris: Secondary | ICD-10-CM | POA: Diagnosis not present

## 2016-10-30 DIAGNOSIS — Z955 Presence of coronary angioplasty implant and graft: Secondary | ICD-10-CM | POA: Diagnosis not present

## 2016-10-30 DIAGNOSIS — S0990XA Unspecified injury of head, initial encounter: Secondary | ICD-10-CM | POA: Diagnosis not present

## 2016-10-30 DIAGNOSIS — N189 Chronic kidney disease, unspecified: Secondary | ICD-10-CM | POA: Diagnosis not present

## 2016-10-30 DIAGNOSIS — W19XXXA Unspecified fall, initial encounter: Secondary | ICD-10-CM

## 2016-10-30 DIAGNOSIS — Z853 Personal history of malignant neoplasm of breast: Secondary | ICD-10-CM | POA: Diagnosis not present

## 2016-10-30 DIAGNOSIS — S0101XA Laceration without foreign body of scalp, initial encounter: Secondary | ICD-10-CM | POA: Insufficient documentation

## 2016-10-30 LAB — URINALYSIS, COMPLETE (UACMP) WITH MICROSCOPIC
BILIRUBIN URINE: NEGATIVE
Glucose, UA: NEGATIVE mg/dL
Hgb urine dipstick: NEGATIVE
KETONES UR: NEGATIVE mg/dL
Nitrite: NEGATIVE
Protein, ur: NEGATIVE mg/dL
SPECIFIC GRAVITY, URINE: 1.011 (ref 1.005–1.030)
pH: 7 (ref 5.0–8.0)

## 2016-10-30 LAB — CBC WITH DIFFERENTIAL/PLATELET
BASOS ABS: 0 10*3/uL (ref 0–0.1)
Basophils Relative: 1 %
EOS PCT: 2 %
Eosinophils Absolute: 0.1 10*3/uL (ref 0–0.7)
HCT: 44.2 % (ref 35.0–47.0)
Hemoglobin: 15 g/dL (ref 12.0–16.0)
LYMPHS ABS: 2.1 10*3/uL (ref 1.0–3.6)
Lymphocytes Relative: 26 %
MCH: 29.6 pg (ref 26.0–34.0)
MCHC: 33.9 g/dL (ref 32.0–36.0)
MCV: 87.3 fL (ref 80.0–100.0)
MONO ABS: 0.6 10*3/uL (ref 0.2–0.9)
MONOS PCT: 8 %
NEUTROS ABS: 5.2 10*3/uL (ref 1.4–6.5)
Neutrophils Relative %: 63 %
PLATELETS: 165 10*3/uL (ref 150–440)
RBC: 5.06 MIL/uL (ref 3.80–5.20)
RDW: 14 % (ref 11.5–14.5)
WBC: 8.2 10*3/uL (ref 3.6–11.0)

## 2016-10-30 LAB — PROTIME-INR
INR: 1.01
Prothrombin Time: 13.3 seconds (ref 11.4–15.2)

## 2016-10-30 LAB — BASIC METABOLIC PANEL
Anion gap: 8 (ref 5–15)
BUN: 14 mg/dL (ref 6–20)
CALCIUM: 9 mg/dL (ref 8.9–10.3)
CO2: 30 mmol/L (ref 22–32)
Chloride: 103 mmol/L (ref 101–111)
Creatinine, Ser: 1.1 mg/dL — ABNORMAL HIGH (ref 0.44–1.00)
GFR calc Af Amer: 53 mL/min — ABNORMAL LOW (ref >60)
GFR, EST NON AFRICAN AMERICAN: 46 mL/min — AB (ref >60)
GLUCOSE: 88 mg/dL (ref 65–99)
Potassium: 4.1 mmol/L (ref 3.5–5.1)
Sodium: 141 mmol/L (ref 135–145)

## 2016-10-30 LAB — TROPONIN I: Troponin I: <0.03 ng/mL (ref <0.03)

## 2016-10-30 MED ORDER — TETANUS-DIPHTH-ACELL PERTUSSIS 5-2.5-18.5 LF-MCG/0.5 IM SUSP
0.5000 mL | Freq: Once | INTRAMUSCULAR | Status: AC
  Administered 2016-10-30: 0.5 mL via INTRAMUSCULAR
  Filled 2016-10-30: qty 0.5

## 2016-10-30 MED ORDER — BACITRACIN ZINC 500 UNIT/GM EX OINT
TOPICAL_OINTMENT | CUTANEOUS | Status: AC
  Filled 2016-10-30: qty 0.9

## 2016-10-30 MED ORDER — NITROFURANTOIN MONOHYD MACRO 100 MG PO CAPS: 100.0000 mg | ORAL_CAPSULE | Freq: Two times a day (BID) | ORAL | 0 refills | Status: AC

## 2016-10-30 MED ORDER — ACETAMINOPHEN 500 MG PO TABS
1000.0000 mg | ORAL_TABLET | Freq: Once | ORAL | Status: AC
  Administered 2016-10-30: 1000 mg via ORAL
  Filled 2016-10-30: qty 2

## 2016-10-30 MED ORDER — LIDOCAINE-EPINEPHRINE (PF) 2 %-1:200000 IJ SOLN
20.0000 mL | Freq: Once | INTRAMUSCULAR | Status: AC
  Administered 2016-10-30: 20 mL
  Filled 2016-10-30: qty 20

## 2016-10-30 MED ORDER — BACITRACIN ZINC 500 UNIT/GM EX OINT: TOPICAL_OINTMENT | CUTANEOUS | Status: DC

## 2016-10-30 MED ORDER — BACITRACIN ZINC 500 UNIT/GM EX OINT
TOPICAL_OINTMENT | CUTANEOUS | Status: AC
  Administered 2016-10-30: 1 via TOPICAL

## 2016-10-30 NOTE — Discharge Instructions (Signed)
Tylenol or Motrin as needed for any discomfort. Take any/all prescribed medications as directed. Keep the cut clean & dry for the next 24 hours. After that you may wash it gently once a day with warm, soapy water only. NO peroxide or alcohol!! You may apply some antibiotic ointment & a Band-Aid afterwards.  Return to ER or PCP in 7-10 days for staple and suture removal.  As discussed in the emergency department, you may use Tylenol and/or Ibuprofen for headaches. These are "Over the Counter" medications and can be found at most drug stores and grocery stores. Please use the recommended dosing instructions on the bottle/box. Do not exceed the maximum dose for either medications. Please be sure to rest and drink plenty of fluids. Please be sure to call your PCP for a follow-up visit, especially if your headaches persist.  Please call your physician or return to ED if you have: 1. Worsening or change in headaches. 2. Changes in vision. 3. New-onset nausea and vomiting. 4. Numbness, tingling, weakness in your extremities,. 4. Inability to eat or drink adequate amounts of food or liquids. 5. Chest pain, shortness of breath, or difficulty breathing. 6. Neurological changes- dizziness, fainting, loss of function of your arms, legs or other parts of your body. 7. Uncontrolled hypertension. 8. Or any other emergent concerns.

## 2016-10-30 NOTE — ED Triage Notes (Signed)
Pt to ED via [facility] c/o fall. Per EMS pt legs gave away and she fell backwards hitting head on parking lot pavement, resulting in a hematoma and laceration to lower posterior region of head. She is alert and oriented, denies LOC and neck pain.

## 2016-10-30 NOTE — ED Notes (Signed)
Patient transported to CT 

## 2016-10-30 NOTE — ED Provider Notes (Signed)
[facility] Emergency Department Provider Note    First MD Initiated Contact with Patient 10/30/16 1538     (approximate)  I have reviewed the triage vital signs and the nursing notes.   HISTORY  Chief Complaint Fall    HPI Marie Burch is a 82 y.o. female  with mechanical fall from standing. Patient was walking in parking lot and states that her legs became weak and gave way when she fell backwards and hit the right posterior aspect of her head on pavement. There was immediate swelling and bleeding. States that there was no LOC but patient is uncertain of this. Does have some mild neck pain. No chest abdomen or pelvic pain. She is able to move her legs and arms after the accident. Aspirin.   Past Medical History:  Diagnosis Date  . Anxiety   . Arthritis   . Blood transfusion 1960's   During Nephrectomy Procedure  . Cancer [facility]) 2001 and 2002   breast, right  . Cancer of colon ([place]) 08/03/12  . Chronic kidney disease    history of right nephrectomy due to stone '60's  . Complication of anesthesia    "I quit breathing" (patient reported it was due to PCN allergy though '60's)  . Coronary artery disease   . Depression   . GERD (gastroesophageal reflux disease)   . Heart murmur    s/p MV ring annuloplasty '03  . Hyperlipidemia   . Hypertension    dr Daneen Schick  . S/P radiation therapy 11/29/01 - 03/18/02   Right Breast: 5,040 cGy/28 Fractions with Boost for Total Dose  of 6,3000 cGy   Family History  Problem Relation Age of Onset  . Heart disease Mother   . Heart disease Father    Past Surgical History:  Procedure Laterality Date  . ABDOMINAL HYSTERECTOMY    . APPENDECTOMY    . APPENDECTOMY  08/03/2012   Procedure: APPENDECTOMY;  Surgeon: Madilyn Hook, DO;  Location: [place];  Service: General;  Laterality: N/A;  incidental appendectomy  . BREAST LUMPECTOMY    . BREAST SURGERY Right 2001  . catarects Bilateral few yrs ago  . COLON SURGERY   07-2012  . COLONOSCOPY WITH PROPOFOL N/A 08/08/2013   Procedure: COLONOSCOPY WITH PROPOFOL;  Surgeon: Garlan Fair, MD;  Location: [facility];  Service: Endoscopy;  Laterality: N/A;  . CORONARY ARTERY BYPASS GRAFT  2003   CABG X 2 (SVGs to DIAG and OM)/MV Repair '03  . DIAGNOSTIC LAPAROSCOPY    . EYE SURGERY      lump removed rt eye  . kidney removed  1960's  . LAPAROTOMY  08/03/2012   Procedure: LAPAROTOMY for Left  Hemicolectomy   ;  Surgeon: Madilyn Hook, DO;  Location: [place];  Service: General;  Laterality: N/A;  Mini Laparotomy  . PROCTOSCOPY  08/03/2012   Procedure: PROCTOSCOPY;  Surgeon: Madilyn Hook, DO;  Location: [place];  Service: General;  Laterality: N/A;   Patient Active Problem List   Diagnosis Date Noted  . S/P mitral valve repair 06/12/2014  . Atherosclerosis of native coronary artery of native heart without angina pectoris 06/12/2014  . Essential hypertension 06/12/2014  . History of PSVT (paroxysmal supraventricular tachycardia) 06/12/2014  . Colon cancer ([place]) 08/29/2012      Prior to Admission medications   Medication Sig Start Date End Date Taking? Authorizing Provider  aspirin EC 81 MG tablet Take 81 mg by mouth daily.    Historical Provider, MD  atenolol (TENORMIN) 25 MG tablet Take 25 mg by mouth every morning.  04/22/12   Historical Provider, MD  clonazePAM (KLONOPIN) 0.5 MG tablet Take 0.25-0.5 mg by mouth at bedtime as needed. For sleep    Historical Provider, MD  donepezil (ARICEPT) 10 MG tablet  09/14/16   Historical Provider, MD  HYDROcodone-acetaminophen (NORCO/VICODIN) 5-325 MG per tablet Take 1 tablet by mouth every 4 (four) hours as needed (pain). 08/08/12   Madilyn Hook, DO  lactose free nutrition (BOOST) LIQD Take 237 mLs by mouth daily.    Historical Provider, MD  lidocaine (LIDODERM) 5 % Place 0.5-1 patches onto the skin daily as needed. For back pain. Remove & Discard patch within 12 hours or as directed by MD    Historical Provider, MD    LORazepam (ATIVAN) 0.5 MG tablet Take 0.5 mg by mouth every 8 (eight) hours as needed for anxiety. anxiety 05/24/12   Historical Provider, MD  Multiple Vitamin (MULTIVITAMIN) tablet Take 1 tablet by mouth daily.    Historical Provider, MD  Naproxen Sodium (ALEVE) 220 MG CAPS Take 220 mg by mouth daily as needed (for pain).     Historical Provider, MD  nitrofurantoin, macrocrystal-monohydrate, (MACROBID) 100 MG capsule Take 1 capsule (100 mg total) by mouth 2 (two) times daily. 10/30/16 11/02/16  Merlyn Lot, MD  NITROSTAT 0.4 MG SL tablet TAKE 1 TABLET UNDER THE TONGUE AS NEEDEDFOR CHEST PAIN (MAY REPEAT EVERY 5 MIN X3 DOSES, CALL 911 IF NO RELIEF.) 06/29/14   Belva Crome, MD  Omega-3 Fatty Acids (FISH OIL) 1000 MG CAPS Take 1,000 mg by mouth 2 (two) times daily.     Historical Provider, MD  Polyethyl Glycol-Propyl Glycol (SYSTANE ULTRA) 0.4-0.3 % SOLN Apply 1 drop to eye daily as needed (for blurry vision).    Historical Provider, MD  polyethylene glycol (MIRALAX / GLYCOLAX) packet Take 17 g by mouth daily as needed (for constipation).    Historical Provider, MD  sertraline (ZOLOFT) 100 MG tablet Take 100 mg by mouth daily.    Historical Provider, MD  traMADol (ULTRAM) 50 MG tablet Take 50 mg by mouth every 6 (six) hours as needed for pain.     Historical Provider, MD    Allergies Penicillins; Levaquin [levofloxacin]; Niacin and related; Norvasc [amlodipine besylate]; Novocain [procaine hcl]; Pravachol [pravastatin sodium]; Statins; Sulfa antibiotics; and Zetia [ezetimibe]    Social History Social History  Substance Use Topics  . Smoking status: Never Smoker  . Smokeless tobacco: Never Used  . Alcohol use No    Review of Systems Patient denies headaches, rhinorrhea, blurry vision, numbness, shortness of breath, chest pain, edema, cough, abdominal pain, nausea, vomiting, diarrhea, dysuria, fevers, rashes or hallucinations unless otherwise stated above in  HPI. ____________________________________________   PHYSICAL EXAM:  VITAL SIGNS: Vitals:   10/30/16 1730 10/30/16 1800  BP: (!) 157/81 140/76  Pulse: 60 (!) 57  Resp: 19 18    Constitutional: Alert and oriented. Eyes: Conjunctivae are normal. PERRL. EOMI. Head: large right parietal scalp hematoma and 2cm stellate laceration to right parietal scalp, no Fb, slow ooze, no pulsatile bleeding.   Nose: No congestion/rhinnorhea. Mouth/Throat: Mucous membranes are moist.  Oropharynx non-erythematous. Neck: No stridor. Painless ROM. No cervical spine tenderness to palpation Hematological/Lymphatic/Immunilogical: No cervical lymphadenopathy. Cardiovascular: Normal rate, regular rhythm. Grossly normal heart sounds.  Good peripheral circulation. Respiratory: Normal respiratory effort.  No retractions. Lungs CTAB. Gastrointestinal: Soft and nontender. No distention. No abdominal bruits. No CVA tenderness. Genitourinary:  Musculoskeletal: No lower  extremity tenderness nor edema.  No joint effusions. Neurologic:  Normal speech and language. No gross focal neurologic deficits are appreciated. No gait instability. Skin:  Skin is warm, dry and intact. No rash noted. Psychiatric: Mood and affect are normal. Speech and behavior are normal.  ____________________________________________   LABS (all labs ordered are listed, but only abnormal results are displayed)  Results for orders placed or performed during the hospital encounter of 10/30/16 (from the past 24 hour(s))  Protime-INR     Status: None   Collection Time: 10/30/16  4:24 PM  Result Value Ref Range   Prothrombin Time 13.3 11.4 - 15.2 seconds   INR 1.01   CBC with Differential/Platelet     Status: None   Collection Time: 10/30/16  4:24 PM  Result Value Ref Range   WBC 8.2 3.6 - 11.0 K/uL   RBC 5.06 3.80 - 5.20 MIL/uL   Hemoglobin 15.0 12.0 - 16.0 g/dL   HCT 44.2 35.0 - 47.0 %   MCV 87.3 80.0 - 100.0 fL   MCH 29.6 26.0 - 34.0 pg    MCHC 33.9 32.0 - 36.0 g/dL   RDW 14.0 11.5 - 14.5 %   Platelets 165 150 - 440 K/uL   Neutrophils Relative % 63 %   Neutro Abs 5.2 1.4 - 6.5 K/uL   Lymphocytes Relative 26 %   Lymphs Abs 2.1 1.0 - 3.6 K/uL   Monocytes Relative 8 %   Monocytes Absolute 0.6 0.2 - 0.9 K/uL   Eosinophils Relative 2 %   Eosinophils Absolute 0.1 0 - 0.7 K/uL   Basophils Relative 1 %   Basophils Absolute 0.0 0 - 0.1 K/uL  Basic metabolic panel     Status: Abnormal   Collection Time: 10/30/16  4:24 PM  Result Value Ref Range   Sodium 141 135 - 145 mmol/L   Potassium 4.1 3.5 - 5.1 mmol/L   Chloride 103 101 - 111 mmol/L   CO2 30 22 - 32 mmol/L   Glucose, Bld 88 65 - 99 mg/dL   BUN 14 6 - 20 mg/dL   Creatinine, Ser 1.10 (H) 0.44 - 1.00 mg/dL   Calcium 9.0 8.9 - 10.3 mg/dL   GFR calc non Af Amer 46 (L) >60 mL/min   GFR calc Af Amer 53 (L) >60 mL/min   Anion gap 8 5 - 15  Troponin I     Status: None   Collection Time: 10/30/16  4:24 PM  Result Value Ref Range   Troponin I <0.03 <0.03 ng/mL  Urinalysis, Complete w Microscopic     Status: Abnormal   Collection Time: 10/30/16  5:35 PM  Result Value Ref Range   Color, Urine YELLOW (A) YELLOW   APPearance HAZY (A) CLEAR   Specific Gravity, Urine 1.011 1.005 - 1.030   pH 7.0 5.0 - 8.0   Glucose, UA NEGATIVE NEGATIVE mg/dL   Hgb urine dipstick NEGATIVE NEGATIVE   Bilirubin Urine NEGATIVE NEGATIVE   Ketones, ur NEGATIVE NEGATIVE mg/dL   Protein, ur NEGATIVE NEGATIVE mg/dL   Nitrite NEGATIVE NEGATIVE   Leukocytes, UA LARGE (A) NEGATIVE   RBC / HPF 0-5 0 - 5 RBC/hpf   WBC, UA 6-30 0 - 5 WBC/hpf   Bacteria, UA RARE (A) NONE SEEN   Squamous Epithelial / LPF 0-5 (A) NONE SEEN   Mucous PRESENT   Troponin I     Status: None   Collection Time: 10/30/16  7:11 PM  Result Value Ref Range  Troponin I <0.03 <0.03 ng/mL   ____________________________________________  EKG My review and personal interpretation at Time: 16:47   Indication: ffs  Rate: 55   Rhythm: sinus Axis: normal Other: poor r wave progression, LBBB, no Sgarbossa criteria ____________________________________________  RADIOLOGY  I personally reviewed all radiographic images ordered to evaluate for the above acute complaints and reviewed radiology reports and findings.  These findings were personally discussed with the patient.  Please see medical record for radiology report.  ____________________________________________   PROCEDURES  Procedure(s) performed:  Marland KitchenMarland KitchenLaceration Repair Date/Time: 10/30/2016 5:35 PM Performed by: Merlyn Lot Authorized by: Merlyn Lot   Consent:    Consent obtained:  Verbal   Consent given by:  Patient Laceration details:    Location:  Scalp   Scalp location:  R parietal   Length (cm):  3   Depth (mm):  3 Repair type:    Repair type:  Complex Pre-procedure details:    Preparation:  Patient was prepped and draped in usual sterile fashion Exploration:    Hemostasis achieved with:  Direct pressure and epinephrine   Wound exploration: entire depth of wound probed and visualized   Treatment:    Area cleansed with:  Betadine   Amount of cleaning:  Standard   Irrigation solution:  Sterile saline   Irrigation method:  Pressure wash   Visualized foreign bodies/material removed: no     Debridement:  Minimal   Undermining:  None Skin repair:    Repair method:  Sutures and staples   Suture size:  3-0   Suture technique:  Retention suture   Number of sutures:  1   Number of staples:  10 Approximation:    Approximation:  Close Post-procedure details:    Dressing:  Antibiotic ointment and sterile dressing   Patient tolerance of procedure:  Tolerated well, no immediate complications      Critical Care performed: no ____________________________________________   INITIAL IMPRESSION / ASSESSMENT AND PLAN / ED COURSE  Pertinent labs & imaging results that were available during my care of the patient were reviewed by me  and considered in my medical decision making (see chart for details).  DDX: sah, sdh, edh, fracture, contusion, soft tissue injury, viscous injury, concussion, hemorrhage   EUDA CREQUE is a 82 y.o. who presents to the ED with mechanical fall from standing with injury to right parietal scalp as described above. CT imaging ordered to evaluate for acute intracranial abnormality or cervical spine fracture shows no evidence of intracranial injury. Patient with possible concussion and she did have LOC but without any neuro deficits at this time. Cervical spine without any evidence of acute fracture. Wound care provided as described above. Patient remains hemodynamic stable. Less consistent with ACS or dysrhythmia. EKG does show left bundle branch block which is new from previous but do not have any recent EKGs the patient is status post known CAD. I do not see any ischemic changes on her tracing. Will order serial troponins to further risk stratify her history is less consistent with ACS. Currently awaiting urinalysis to evaluate for any evidence of acute infection.  Clinical Course as of Oct 30 1944  Fri Oct 30, 2016  1753 Wound repair as described above. Patient remains hemodynamic stable without any chest pain or shortness of breath. Her EKG does show evidence of a left bundle branch block but no ischemic changes. Will order serial troponins to ensure the patient's not having any evidence of underlying ischemia during this fall. Patient for cardiac  event based on her description of fall.  [PR]  1941 Repeat troponin is negative.  Patient was able to tolerate PO and was able to ambulate with a steady gait. Requesting DC home.  Have discussed sxs of concussion and reasons for which patient should return to ED.  Have discussed with the patient and available family all diagnostics and treatments performed thus far and all questions were answered to the best of my ability. The patient demonstrates understanding  and agreement with plan.    [PR]  1942     Clinical Course User Index [PR] Merlyn Lot, MD     ____________________________________________   FINAL CLINICAL IMPRESSION(S) / ED DIAGNOSES  Final diagnoses:  Scalp hematoma, initial encounter  Laceration of scalp, initial encounter  Fall from standing, initial encounter  Acute cystitis without hematuria  Minor head injury, initial encounter      NEW MEDICATIONS STARTED DURING THIS VISIT:  New Prescriptions   NITROFURANTOIN, MACROCRYSTAL-MONOHYDRATE, (MACROBID) 100 MG CAPSULE    Take 1 capsule (100 mg total) by mouth 2 (two) times daily.     Note:  This document was prepared using Dragon voice recognition software and may include unintentional dictation errors.    Merlyn Lot, MD 10/30/16 1946

## 2016-10-30 NOTE — ED Notes (Signed)
MD at bedside suturing laceration.

## 2016-11-06 DIAGNOSIS — Z4802 Encounter for removal of sutures: Secondary | ICD-10-CM | POA: Diagnosis not present

## 2017-02-03 DIAGNOSIS — Z1389 Encounter for screening for other disorder: Secondary | ICD-10-CM | POA: Diagnosis not present

## 2017-02-03 DIAGNOSIS — Z79899 Other long term (current) drug therapy: Secondary | ICD-10-CM | POA: Diagnosis not present

## 2017-02-03 DIAGNOSIS — N183 Chronic kidney disease, stage 3 (moderate): Secondary | ICD-10-CM | POA: Diagnosis not present

## 2017-02-03 DIAGNOSIS — R269 Unspecified abnormalities of gait and mobility: Secondary | ICD-10-CM | POA: Diagnosis not present

## 2017-02-03 DIAGNOSIS — F325 Major depressive disorder, single episode, in full remission: Secondary | ICD-10-CM | POA: Diagnosis not present

## 2017-02-03 DIAGNOSIS — G301 Alzheimer's disease with late onset: Secondary | ICD-10-CM | POA: Diagnosis not present

## 2017-02-03 DIAGNOSIS — Z0000 Encounter for general adult medical examination without abnormal findings: Secondary | ICD-10-CM | POA: Diagnosis not present

## 2017-02-03 DIAGNOSIS — F0280 Dementia in other diseases classified elsewhere without behavioral disturbance: Secondary | ICD-10-CM | POA: Diagnosis not present

## 2017-02-03 DIAGNOSIS — I129 Hypertensive chronic kidney disease with stage 1 through stage 4 chronic kidney disease, or unspecified chronic kidney disease: Secondary | ICD-10-CM | POA: Diagnosis not present

## 2017-02-11 DIAGNOSIS — R262 Difficulty in walking, not elsewhere classified: Secondary | ICD-10-CM | POA: Diagnosis not present

## 2017-02-11 DIAGNOSIS — R2681 Unsteadiness on feet: Secondary | ICD-10-CM | POA: Diagnosis not present

## 2017-02-15 DIAGNOSIS — R262 Difficulty in walking, not elsewhere classified: Secondary | ICD-10-CM | POA: Diagnosis not present

## 2017-02-15 DIAGNOSIS — R2681 Unsteadiness on feet: Secondary | ICD-10-CM | POA: Diagnosis not present

## 2017-02-18 ENCOUNTER — Other Ambulatory Visit: Payer: Self-pay | Admitting: Interventional Cardiology

## 2017-02-18 DIAGNOSIS — R262 Difficulty in walking, not elsewhere classified: Secondary | ICD-10-CM | POA: Diagnosis not present

## 2017-02-18 DIAGNOSIS — R2681 Unsteadiness on feet: Secondary | ICD-10-CM | POA: Diagnosis not present

## 2017-02-23 DIAGNOSIS — R2681 Unsteadiness on feet: Secondary | ICD-10-CM | POA: Diagnosis not present

## 2017-02-23 DIAGNOSIS — R262 Difficulty in walking, not elsewhere classified: Secondary | ICD-10-CM | POA: Diagnosis not present

## 2017-03-01 DIAGNOSIS — R262 Difficulty in walking, not elsewhere classified: Secondary | ICD-10-CM | POA: Diagnosis not present

## 2017-03-01 DIAGNOSIS — R2681 Unsteadiness on feet: Secondary | ICD-10-CM | POA: Diagnosis not present

## 2017-03-02 DIAGNOSIS — R262 Difficulty in walking, not elsewhere classified: Secondary | ICD-10-CM | POA: Diagnosis not present

## 2017-03-02 DIAGNOSIS — R2681 Unsteadiness on feet: Secondary | ICD-10-CM | POA: Diagnosis not present

## 2017-03-05 DIAGNOSIS — R262 Difficulty in walking, not elsewhere classified: Secondary | ICD-10-CM | POA: Diagnosis not present

## 2017-03-05 DIAGNOSIS — R2681 Unsteadiness on feet: Secondary | ICD-10-CM | POA: Diagnosis not present

## 2017-03-08 DIAGNOSIS — R2681 Unsteadiness on feet: Secondary | ICD-10-CM | POA: Diagnosis not present

## 2017-03-08 DIAGNOSIS — R262 Difficulty in walking, not elsewhere classified: Secondary | ICD-10-CM | POA: Diagnosis not present

## 2017-03-10 DIAGNOSIS — R262 Difficulty in walking, not elsewhere classified: Secondary | ICD-10-CM | POA: Diagnosis not present

## 2017-03-10 DIAGNOSIS — R2681 Unsteadiness on feet: Secondary | ICD-10-CM | POA: Diagnosis not present

## 2017-03-15 DIAGNOSIS — R2681 Unsteadiness on feet: Secondary | ICD-10-CM | POA: Diagnosis not present

## 2017-03-15 DIAGNOSIS — R262 Difficulty in walking, not elsewhere classified: Secondary | ICD-10-CM | POA: Diagnosis not present

## 2017-03-16 DIAGNOSIS — G301 Alzheimer's disease with late onset: Secondary | ICD-10-CM | POA: Diagnosis not present

## 2017-03-16 DIAGNOSIS — N183 Chronic kidney disease, stage 3 (moderate): Secondary | ICD-10-CM | POA: Diagnosis not present

## 2017-03-16 DIAGNOSIS — F0280 Dementia in other diseases classified elsewhere without behavioral disturbance: Secondary | ICD-10-CM | POA: Diagnosis not present

## 2017-03-16 DIAGNOSIS — I129 Hypertensive chronic kidney disease with stage 1 through stage 4 chronic kidney disease, or unspecified chronic kidney disease: Secondary | ICD-10-CM | POA: Diagnosis not present

## 2017-09-20 DIAGNOSIS — I129 Hypertensive chronic kidney disease with stage 1 through stage 4 chronic kidney disease, or unspecified chronic kidney disease: Secondary | ICD-10-CM | POA: Diagnosis not present

## 2017-09-20 DIAGNOSIS — Z79899 Other long term (current) drug therapy: Secondary | ICD-10-CM | POA: Diagnosis not present

## 2017-09-20 DIAGNOSIS — G301 Alzheimer's disease with late onset: Secondary | ICD-10-CM | POA: Diagnosis not present

## 2017-09-20 DIAGNOSIS — N183 Chronic kidney disease, stage 3 (moderate): Secondary | ICD-10-CM | POA: Diagnosis not present

## 2017-10-27 DIAGNOSIS — H532 Diplopia: Secondary | ICD-10-CM | POA: Diagnosis not present

## 2017-10-27 DIAGNOSIS — H01025 Squamous blepharitis left lower eyelid: Secondary | ICD-10-CM | POA: Diagnosis not present

## 2017-10-27 DIAGNOSIS — H35371 Puckering of macula, right eye: Secondary | ICD-10-CM | POA: Diagnosis not present

## 2017-10-27 DIAGNOSIS — Z961 Presence of intraocular lens: Secondary | ICD-10-CM | POA: Diagnosis not present

## 2017-10-27 DIAGNOSIS — H01021 Squamous blepharitis right upper eyelid: Secondary | ICD-10-CM | POA: Diagnosis not present

## 2017-10-27 DIAGNOSIS — H43813 Vitreous degeneration, bilateral: Secondary | ICD-10-CM | POA: Diagnosis not present

## 2017-10-27 DIAGNOSIS — H01022 Squamous blepharitis right lower eyelid: Secondary | ICD-10-CM | POA: Diagnosis not present

## 2017-10-27 DIAGNOSIS — H01024 Squamous blepharitis left upper eyelid: Secondary | ICD-10-CM | POA: Diagnosis not present

## 2018-03-21 DIAGNOSIS — R42 Dizziness and giddiness: Secondary | ICD-10-CM | POA: Diagnosis not present

## 2018-03-21 DIAGNOSIS — Z23 Encounter for immunization: Secondary | ICD-10-CM | POA: Diagnosis not present

## 2018-03-21 DIAGNOSIS — G301 Alzheimer's disease with late onset: Secondary | ICD-10-CM | POA: Diagnosis not present

## 2018-03-21 DIAGNOSIS — Z0000 Encounter for general adult medical examination without abnormal findings: Secondary | ICD-10-CM | POA: Diagnosis not present

## 2018-03-21 DIAGNOSIS — N183 Chronic kidney disease, stage 3 (moderate): Secondary | ICD-10-CM | POA: Diagnosis not present

## 2018-03-21 DIAGNOSIS — F0280 Dementia in other diseases classified elsewhere without behavioral disturbance: Secondary | ICD-10-CM | POA: Diagnosis not present

## 2018-03-21 DIAGNOSIS — Z79899 Other long term (current) drug therapy: Secondary | ICD-10-CM | POA: Diagnosis not present

## 2018-03-21 DIAGNOSIS — F325 Major depressive disorder, single episode, in full remission: Secondary | ICD-10-CM | POA: Diagnosis not present

## 2018-03-21 DIAGNOSIS — I129 Hypertensive chronic kidney disease with stage 1 through stage 4 chronic kidney disease, or unspecified chronic kidney disease: Secondary | ICD-10-CM | POA: Diagnosis not present

## 2018-04-18 DIAGNOSIS — Z79899 Other long term (current) drug therapy: Secondary | ICD-10-CM | POA: Diagnosis not present

## 2018-05-25 DIAGNOSIS — R739 Hyperglycemia, unspecified: Secondary | ICD-10-CM | POA: Diagnosis not present

## 2018-05-25 DIAGNOSIS — Z79899 Other long term (current) drug therapy: Secondary | ICD-10-CM | POA: Diagnosis not present

## 2018-06-08 DIAGNOSIS — R739 Hyperglycemia, unspecified: Secondary | ICD-10-CM | POA: Diagnosis not present

## 2018-08-08 DIAGNOSIS — F325 Major depressive disorder, single episode, in full remission: Secondary | ICD-10-CM | POA: Diagnosis not present

## 2018-08-08 DIAGNOSIS — N183 Chronic kidney disease, stage 3 (moderate): Secondary | ICD-10-CM | POA: Diagnosis not present

## 2018-08-08 DIAGNOSIS — F0280 Dementia in other diseases classified elsewhere without behavioral disturbance: Secondary | ICD-10-CM | POA: Diagnosis not present

## 2018-08-08 DIAGNOSIS — E785 Hyperlipidemia, unspecified: Secondary | ICD-10-CM | POA: Diagnosis not present

## 2018-08-08 DIAGNOSIS — G301 Alzheimer's disease with late onset: Secondary | ICD-10-CM | POA: Diagnosis not present

## 2018-09-23 DIAGNOSIS — R42 Dizziness and giddiness: Secondary | ICD-10-CM | POA: Diagnosis not present

## 2018-09-23 DIAGNOSIS — R197 Diarrhea, unspecified: Secondary | ICD-10-CM | POA: Diagnosis not present

## 2018-09-23 DIAGNOSIS — N183 Chronic kidney disease, stage 3 (moderate): Secondary | ICD-10-CM | POA: Diagnosis not present

## 2018-09-23 DIAGNOSIS — I129 Hypertensive chronic kidney disease with stage 1 through stage 4 chronic kidney disease, or unspecified chronic kidney disease: Secondary | ICD-10-CM | POA: Diagnosis not present

## 2018-09-23 DIAGNOSIS — Z79899 Other long term (current) drug therapy: Secondary | ICD-10-CM | POA: Diagnosis not present

## 2018-10-28 DIAGNOSIS — H43813 Vitreous degeneration, bilateral: Secondary | ICD-10-CM | POA: Diagnosis not present

## 2018-10-28 DIAGNOSIS — H01024 Squamous blepharitis left upper eyelid: Secondary | ICD-10-CM | POA: Diagnosis not present

## 2018-10-28 DIAGNOSIS — H01022 Squamous blepharitis right lower eyelid: Secondary | ICD-10-CM | POA: Diagnosis not present

## 2018-10-28 DIAGNOSIS — H01021 Squamous blepharitis right upper eyelid: Secondary | ICD-10-CM | POA: Diagnosis not present

## 2018-10-28 DIAGNOSIS — Z961 Presence of intraocular lens: Secondary | ICD-10-CM | POA: Diagnosis not present

## 2018-10-28 DIAGNOSIS — H01025 Squamous blepharitis left lower eyelid: Secondary | ICD-10-CM | POA: Diagnosis not present

## 2018-10-28 DIAGNOSIS — H532 Diplopia: Secondary | ICD-10-CM | POA: Diagnosis not present

## 2018-10-28 DIAGNOSIS — H35371 Puckering of macula, right eye: Secondary | ICD-10-CM | POA: Diagnosis not present

## 2019-01-03 DIAGNOSIS — R260 Ataxic gait: Secondary | ICD-10-CM | POA: Diagnosis not present

## 2019-01-10 DIAGNOSIS — R260 Ataxic gait: Secondary | ICD-10-CM | POA: Diagnosis not present

## 2019-01-17 DIAGNOSIS — R260 Ataxic gait: Secondary | ICD-10-CM | POA: Diagnosis not present

## 2019-01-24 DIAGNOSIS — R260 Ataxic gait: Secondary | ICD-10-CM | POA: Diagnosis not present

## 2019-01-31 DIAGNOSIS — R260 Ataxic gait: Secondary | ICD-10-CM | POA: Diagnosis not present

## 2019-04-05 DIAGNOSIS — R260 Ataxic gait: Secondary | ICD-10-CM | POA: Diagnosis not present

## 2019-04-10 DIAGNOSIS — N183 Chronic kidney disease, stage 3 (moderate): Secondary | ICD-10-CM | POA: Diagnosis not present

## 2019-04-10 DIAGNOSIS — Z79899 Other long term (current) drug therapy: Secondary | ICD-10-CM | POA: Diagnosis not present

## 2019-04-10 DIAGNOSIS — F0280 Dementia in other diseases classified elsewhere without behavioral disturbance: Secondary | ICD-10-CM | POA: Diagnosis not present

## 2019-04-10 DIAGNOSIS — G301 Alzheimer's disease with late onset: Secondary | ICD-10-CM | POA: Diagnosis not present

## 2019-04-10 DIAGNOSIS — R7309 Other abnormal glucose: Secondary | ICD-10-CM | POA: Diagnosis not present

## 2019-04-10 DIAGNOSIS — I129 Hypertensive chronic kidney disease with stage 1 through stage 4 chronic kidney disease, or unspecified chronic kidney disease: Secondary | ICD-10-CM | POA: Diagnosis not present

## 2019-04-10 DIAGNOSIS — R300 Dysuria: Secondary | ICD-10-CM | POA: Diagnosis not present

## 2019-04-12 DIAGNOSIS — R260 Ataxic gait: Secondary | ICD-10-CM | POA: Diagnosis not present

## 2019-04-19 DIAGNOSIS — R260 Ataxic gait: Secondary | ICD-10-CM | POA: Diagnosis not present

## 2019-04-25 DIAGNOSIS — R260 Ataxic gait: Secondary | ICD-10-CM | POA: Diagnosis not present

## 2019-05-03 DIAGNOSIS — R260 Ataxic gait: Secondary | ICD-10-CM | POA: Diagnosis not present

## 2019-05-10 DIAGNOSIS — R260 Ataxic gait: Secondary | ICD-10-CM | POA: Diagnosis not present

## 2019-05-17 DIAGNOSIS — R260 Ataxic gait: Secondary | ICD-10-CM | POA: Diagnosis not present

## 2019-05-31 DIAGNOSIS — R260 Ataxic gait: Secondary | ICD-10-CM | POA: Diagnosis not present

## 2019-06-07 DIAGNOSIS — R260 Ataxic gait: Secondary | ICD-10-CM | POA: Diagnosis not present

## 2019-06-14 DIAGNOSIS — R260 Ataxic gait: Secondary | ICD-10-CM | POA: Diagnosis not present

## 2019-06-21 DIAGNOSIS — R260 Ataxic gait: Secondary | ICD-10-CM | POA: Diagnosis not present

## 2019-06-28 DIAGNOSIS — R260 Ataxic gait: Secondary | ICD-10-CM | POA: Diagnosis not present

## 2019-07-05 DIAGNOSIS — R260 Ataxic gait: Secondary | ICD-10-CM | POA: Diagnosis not present

## 2019-07-12 DIAGNOSIS — R260 Ataxic gait: Secondary | ICD-10-CM | POA: Diagnosis not present

## 2019-07-19 DIAGNOSIS — R260 Ataxic gait: Secondary | ICD-10-CM | POA: Diagnosis not present

## 2019-08-02 DIAGNOSIS — R260 Ataxic gait: Secondary | ICD-10-CM | POA: Diagnosis not present

## 2019-08-09 DIAGNOSIS — R260 Ataxic gait: Secondary | ICD-10-CM | POA: Diagnosis not present

## 2019-08-16 DIAGNOSIS — R260 Ataxic gait: Secondary | ICD-10-CM | POA: Diagnosis not present

## 2019-08-23 DIAGNOSIS — R260 Ataxic gait: Secondary | ICD-10-CM | POA: Diagnosis not present

## 2019-09-13 DIAGNOSIS — R260 Ataxic gait: Secondary | ICD-10-CM | POA: Diagnosis not present

## 2019-09-20 DIAGNOSIS — R260 Ataxic gait: Secondary | ICD-10-CM | POA: Diagnosis not present

## 2019-09-27 DIAGNOSIS — R260 Ataxic gait: Secondary | ICD-10-CM | POA: Diagnosis not present

## 2019-10-11 DIAGNOSIS — R260 Ataxic gait: Secondary | ICD-10-CM | POA: Diagnosis not present

## 2019-10-18 DIAGNOSIS — R260 Ataxic gait: Secondary | ICD-10-CM | POA: Diagnosis not present

## 2019-10-26 ENCOUNTER — Ambulatory Visit: Payer: PPO | Attending: Internal Medicine

## 2019-10-26 DIAGNOSIS — Z20828 Contact with and (suspected) exposure to other viral communicable diseases: Secondary | ICD-10-CM | POA: Diagnosis not present

## 2019-10-26 DIAGNOSIS — Z20822 Contact with and (suspected) exposure to covid-19: Secondary | ICD-10-CM

## 2019-10-27 LAB — NOVEL CORONAVIRUS, NAA: SARS-CoV-2, NAA: NOT DETECTED

## 2019-11-06 DIAGNOSIS — H0102A Squamous blepharitis right eye, upper and lower eyelids: Secondary | ICD-10-CM | POA: Diagnosis not present

## 2019-11-06 DIAGNOSIS — Z961 Presence of intraocular lens: Secondary | ICD-10-CM | POA: Diagnosis not present

## 2019-11-06 DIAGNOSIS — H35371 Puckering of macula, right eye: Secondary | ICD-10-CM | POA: Diagnosis not present

## 2019-11-06 DIAGNOSIS — H43813 Vitreous degeneration, bilateral: Secondary | ICD-10-CM | POA: Diagnosis not present

## 2019-11-06 DIAGNOSIS — H0102B Squamous blepharitis left eye, upper and lower eyelids: Secondary | ICD-10-CM | POA: Diagnosis not present

## 2019-11-07 DIAGNOSIS — F331 Major depressive disorder, recurrent, moderate: Secondary | ICD-10-CM | POA: Diagnosis not present

## 2019-11-07 DIAGNOSIS — Z0000 Encounter for general adult medical examination without abnormal findings: Secondary | ICD-10-CM | POA: Diagnosis not present

## 2019-11-07 DIAGNOSIS — E785 Hyperlipidemia, unspecified: Secondary | ICD-10-CM | POA: Diagnosis not present

## 2019-11-07 DIAGNOSIS — K6289 Other specified diseases of anus and rectum: Secondary | ICD-10-CM | POA: Diagnosis not present

## 2019-11-07 DIAGNOSIS — R7309 Other abnormal glucose: Secondary | ICD-10-CM | POA: Diagnosis not present

## 2019-11-07 DIAGNOSIS — Z79899 Other long term (current) drug therapy: Secondary | ICD-10-CM | POA: Diagnosis not present

## 2019-11-07 DIAGNOSIS — I129 Hypertensive chronic kidney disease with stage 1 through stage 4 chronic kidney disease, or unspecified chronic kidney disease: Secondary | ICD-10-CM | POA: Diagnosis not present

## 2019-11-07 DIAGNOSIS — M810 Age-related osteoporosis without current pathological fracture: Secondary | ICD-10-CM | POA: Diagnosis not present

## 2019-11-07 DIAGNOSIS — F0280 Dementia in other diseases classified elsewhere without behavioral disturbance: Secondary | ICD-10-CM | POA: Diagnosis not present

## 2019-11-07 DIAGNOSIS — G301 Alzheimer's disease with late onset: Secondary | ICD-10-CM | POA: Diagnosis not present

## 2019-11-07 DIAGNOSIS — N1831 Chronic kidney disease, stage 3a: Secondary | ICD-10-CM | POA: Diagnosis not present

## 2019-11-07 DIAGNOSIS — R21 Rash and other nonspecific skin eruption: Secondary | ICD-10-CM | POA: Diagnosis not present

## 2019-11-08 DIAGNOSIS — R260 Ataxic gait: Secondary | ICD-10-CM | POA: Diagnosis not present

## 2019-11-14 DIAGNOSIS — R260 Ataxic gait: Secondary | ICD-10-CM | POA: Diagnosis not present

## 2019-11-22 DIAGNOSIS — R260 Ataxic gait: Secondary | ICD-10-CM | POA: Diagnosis not present

## 2019-11-29 DIAGNOSIS — R260 Ataxic gait: Secondary | ICD-10-CM | POA: Diagnosis not present

## 2019-12-06 DIAGNOSIS — R260 Ataxic gait: Secondary | ICD-10-CM | POA: Diagnosis not present

## 2019-12-20 DIAGNOSIS — R260 Ataxic gait: Secondary | ICD-10-CM | POA: Diagnosis not present

## 2020-01-03 DIAGNOSIS — R260 Ataxic gait: Secondary | ICD-10-CM | POA: Diagnosis not present

## 2020-01-10 DIAGNOSIS — R260 Ataxic gait: Secondary | ICD-10-CM | POA: Diagnosis not present

## 2020-01-15 DIAGNOSIS — M85852 Other specified disorders of bone density and structure, left thigh: Secondary | ICD-10-CM | POA: Diagnosis not present

## 2020-01-15 DIAGNOSIS — M810 Age-related osteoporosis without current pathological fracture: Secondary | ICD-10-CM | POA: Diagnosis not present

## 2020-01-15 DIAGNOSIS — Z780 Asymptomatic menopausal state: Secondary | ICD-10-CM | POA: Diagnosis not present

## 2020-01-15 DIAGNOSIS — M85851 Other specified disorders of bone density and structure, right thigh: Secondary | ICD-10-CM | POA: Diagnosis not present

## 2020-01-24 DIAGNOSIS — A1889 Tuberculosis of other sites: Secondary | ICD-10-CM | POA: Diagnosis not present

## 2020-02-14 DIAGNOSIS — Z20828 Contact with and (suspected) exposure to other viral communicable diseases: Secondary | ICD-10-CM | POA: Diagnosis not present

## 2020-02-14 DIAGNOSIS — Z1159 Encounter for screening for other viral diseases: Secondary | ICD-10-CM | POA: Diagnosis not present

## 2020-02-17 ENCOUNTER — Emergency Department (HOSPITAL_COMMUNITY): Payer: PPO

## 2020-02-17 ENCOUNTER — Emergency Department (HOSPITAL_COMMUNITY)
Admission: EM | Admit: 2020-02-17 | Discharge: 2020-02-17 | Payer: PPO | Attending: Emergency Medicine | Admitting: Emergency Medicine

## 2020-02-17 DIAGNOSIS — Y999 Unspecified external cause status: Secondary | ICD-10-CM | POA: Insufficient documentation

## 2020-02-17 DIAGNOSIS — Y9389 Activity, other specified: Secondary | ICD-10-CM | POA: Diagnosis not present

## 2020-02-17 DIAGNOSIS — W19XXXA Unspecified fall, initial encounter: Secondary | ICD-10-CM | POA: Diagnosis not present

## 2020-02-17 DIAGNOSIS — Y92129 Unspecified place in nursing home as the place of occurrence of the external cause: Secondary | ICD-10-CM | POA: Insufficient documentation

## 2020-02-17 DIAGNOSIS — W010XXA Fall on same level from slipping, tripping and stumbling without subsequent striking against object, initial encounter: Secondary | ICD-10-CM | POA: Diagnosis not present

## 2020-02-17 DIAGNOSIS — S0003XA Contusion of scalp, initial encounter: Secondary | ICD-10-CM | POA: Diagnosis not present

## 2020-02-17 DIAGNOSIS — R519 Headache, unspecified: Secondary | ICD-10-CM | POA: Diagnosis not present

## 2020-02-17 DIAGNOSIS — R0902 Hypoxemia: Secondary | ICD-10-CM | POA: Diagnosis not present

## 2020-02-17 DIAGNOSIS — F0390 Unspecified dementia without behavioral disturbance: Secondary | ICD-10-CM | POA: Insufficient documentation

## 2020-02-17 DIAGNOSIS — S0990XA Unspecified injury of head, initial encounter: Secondary | ICD-10-CM | POA: Diagnosis not present

## 2020-02-17 MED ORDER — LORAZEPAM 1 MG PO TABS
0.5000 mg | ORAL_TABLET | Freq: Once | ORAL | Status: AC
  Administered 2020-02-17: 18:00:00 0.5 mg via ORAL
  Filled 2020-02-17: qty 1

## 2020-02-17 NOTE — Discharge Instructions (Addendum)
Thank you for allowing me to care for you today. Please return to the emergency department if you have new or worsening symptoms. Take your medications as instructed.  ° °

## 2020-02-17 NOTE — ED Notes (Signed)
Pt back from CT

## 2020-02-17 NOTE — ED Triage Notes (Signed)
Pt from [facility] memory care unit. While ambulating with walker on carpeted floor, pt lost her footing and fell backwards hitting her head. Sustained a hematoma on back of head. Pt has no complaints. Denies blood thinners. No LOC.

## 2020-02-17 NOTE — ED Notes (Signed)
Confirmed pt lives at [facility] on [address] with facility as pt did not come with any paperwork. Facility staff did confirm pt is a resident there in memory unit.

## 2020-02-17 NOTE — ED Notes (Signed)
Pt getting out of bed multiple times due to confusion. Unable to keep monitoring devices safely on patient. Will monitor VS periodically to avoid agitating pt more.

## 2020-02-17 NOTE — ED Notes (Signed)
Patient transported to CT 

## 2020-02-17 NOTE — ED Provider Notes (Signed)
Medical screening examination/treatment/procedure(s) were conducted as a shared visit with non-physician practitioner(s) and myself.  I personally evaluated the patient during the encounter.    86 year old female here for mechanical fall.  X-rays negative.  Will discharge home   Marie Leigh, MD 02/17/20 2013

## 2020-02-17 NOTE — ED Provider Notes (Signed)
[facility]MENT Provider Note   CSN: [id number] Arrival date & time: 02/17/20  1648     History Chief Complaint  Patient presents with  . Fall  . Head Injury    [facility] is a 86 y.o. female.  Patient is a 86 year old female coming from nursing home with past medical history of dementia, HTN, HLD, CKD presenting to the emergency department for a witnessed mechanical fall.  Per EMS, nursing staff noted the patient was walking with her walker and tripped over her footing and fell backwards.  She did hit her head but did not pass out.  She is at her baseline mental status.  The patient herself denies any complaints.  She does take a daily aspirin.        Past Medical History:  Diagnosis Date  . Anxiety   . Arthritis   . Blood transfusion 1960's   During Nephrectomy Procedure  . Cancer [facility]) 2001 and 2002   breast, right  . Cancer of colon ([place]) 08/03/12  . Chronic kidney disease    history of right nephrectomy due to stone '60's  . Complication of anesthesia    "I quit breathing" (patient reported it was due to PCN allergy though '60's)  . Coronary artery disease   . Depression   . GERD (gastroesophageal reflux disease)   . Heart murmur    s/p MV ring annuloplasty '03  . Hyperlipidemia   . Hypertension    dr Daneen Schick  . S/P radiation therapy 11/29/01 - 03/18/02   Right Breast: 5,040 cGy/28 Fractions with Boost for Total Dose  of 6,3000 cGy    Patient Active Problem List   Diagnosis Date Noted  . S/P mitral valve repair 06/12/2014  . Atherosclerosis of native coronary artery of native heart without angina pectoris 06/12/2014  . Essential hypertension 06/12/2014  . History of PSVT (paroxysmal supraventricular tachycardia) 06/12/2014  . Colon cancer ([place]) 08/29/2012    Past Surgical History:  Procedure Laterality Date  . ABDOMINAL HYSTERECTOMY    . APPENDECTOMY    . APPENDECTOMY  08/03/2012   Procedure: APPENDECTOMY;   Surgeon: Madilyn Hook, DO;  Location: [place];  Service: General;  Laterality: N/A;  incidental appendectomy  . BREAST LUMPECTOMY    . BREAST SURGERY Right 2001  . catarects Bilateral few yrs ago  . COLON SURGERY  07-2012  . COLONOSCOPY WITH PROPOFOL N/A 08/08/2013   Procedure: COLONOSCOPY WITH PROPOFOL;  Surgeon: Garlan Fair, MD;  [facility];  Service: Endoscopy;  Laterality: N/A;  . CORONARY ARTERY BYPASS GRAFT  2003   CABG X 2 (SVGs to DIAG and OM)/MV Repair '03  . DIAGNOSTIC LAPAROSCOPY    . EYE SURGERY      lump removed rt eye  . kidney removed  1960's  . LAPAROTOMY  08/03/2012   Procedure: LAPAROTOMY for Left  Hemicolectomy   ;  Surgeon: Madilyn Hook, DO;  Location: [place];  Service: General;  Laterality: N/A;  Mini Laparotomy  . PROCTOSCOPY  08/03/2012   Procedure: PROCTOSCOPY;  Surgeon: Madilyn Hook, DO;  Location: [place];  Service: General;  Laterality: N/A;     OB History   No obstetric history on file.     Family History  Problem Relation Age of Onset  . Heart disease Mother   . Heart disease Father     Social History   Tobacco Use  . Smoking status: Never Smoker  . Smokeless tobacco: Never Used  Substance Use Topics  . Alcohol use: No  . Drug use: No    Home Medications Prior to Admission medications   Medication Sig Start Date End Date Taking? Authorizing Provider  aspirin EC 81 MG tablet Take 81 mg by mouth daily.    [provider]  atenolol (TENORMIN) 25 MG tablet Take 25 mg by mouth every morning.  04/22/12   [provider]  clonazePAM (KLONOPIN) 0.5 MG tablet Take 0.25-0.5 mg by mouth at bedtime as needed. For sleep    [provider]  donepezil (ARICEPT) 10 MG tablet  09/14/16   [provider]  HYDROcodone-acetaminophen (NORCO/VICODIN) 5-325 MG per tablet Take 1 tablet by mouth every 4 (four) hours as needed (pain). 08/08/12   Madilyn Hook, DO  lactose free nutrition (BOOST) LIQD Take 237 mLs by mouth  daily.    [provider]  lidocaine (LIDODERM) 5 % Place 0.5-1 patches onto the skin daily as needed. For back pain. Remove & Discard patch within 12 hours or as directed by MD    [provider]  LORazepam (ATIVAN) 0.5 MG tablet Take 0.5 mg by mouth every 8 (eight) hours as needed for anxiety. anxiety 05/24/12   [provider]  Multiple Vitamin (MULTIVITAMIN) tablet Take 1 tablet by mouth daily.    [provider]  Naproxen Sodium (ALEVE) 220 MG CAPS Take 220 mg by mouth daily as needed (for pain).     [provider]  NITROSTAT 0.4 MG SL tablet DISSOLVE 1 TABLET UNDER THE TONGUE EVERY5 MINUTES AS NEEDED UP TO 3 DOSES 02/18/17   Belva Crome, MD  Omega-3 Fatty Acids (FISH OIL) 1000 MG CAPS Take 1,000 mg by mouth 2 (two) times daily.     [provider]  Polyethyl Glycol-Propyl Glycol (SYSTANE ULTRA) 0.4-0.3 % SOLN Apply 1 drop to eye daily as needed (for blurry vision).    [provider]  polyethylene glycol (MIRALAX / GLYCOLAX) packet Take 17 g by mouth daily as needed (for constipation).    [provider]  sertraline (ZOLOFT) 100 MG tablet Take 100 mg by mouth daily.    [provider]  traMADol (ULTRAM) 50 MG tablet Take 50 mg by mouth every 6 (six) hours as needed for pain.     [provider]    Allergies    Penicillins, Levaquin [levofloxacin], Niacin and related, Norvasc [amlodipine besylate], Novocain [procaine hcl], Pravachol [pravastatin sodium], Statins, Sulfa antibiotics, and Zetia [ezetimibe]  Review of Systems   Review of Systems  Constitutional: Negative for chills and fever.  HENT: Negative for ear pain and sore throat.   Eyes: Negative for pain and visual disturbance.  Respiratory: Negative for cough and shortness of breath.   Cardiovascular: Negative for chest pain and palpitations.  Gastrointestinal: Negative for abdominal pain and vomiting.  Genitourinary: Negative for dysuria  and hematuria.  Musculoskeletal: Negative for arthralgias and back pain.  Skin: Negative for color change and rash.  Neurological: Negative for syncope and headaches.  All other systems reviewed and are negative.   Physical Exam Updated Vital Signs BP (!) 154/64   Pulse 66   Temp 98.1 F (36.7 C) (Oral)   Resp 18   SpO2 94%   Physical Exam Vitals and nursing note reviewed.  Constitutional:      General: She is not in acute distress.    Appearance: Normal appearance. She is not ill-appearing, toxic-appearing or diaphoretic.  HENT:     Head: Normocephalic.  Eyes:     Conjunctiva/sclera: Conjunctivae normal.  Pulmonary:     Effort: Pulmonary effort is normal.  Musculoskeletal:        General: No swelling, tenderness, deformity or signs of injury. Normal range of motion.     Cervical back: Full passive range of motion without pain, normal range of motion and neck supple. No spinous process tenderness or muscular tenderness.  Skin:    General: Skin is dry.  Neurological:     Mental Status: She is alert.  Psychiatric:        Mood and Affect: Mood normal.     ED Results / Procedures / Treatments   Labs (all labs ordered are listed, but only abnormal results are displayed) Labs Reviewed - No data to display  EKG None  Radiology CT Head Wo Contrast  Result Date: 02/17/2020 CLINICAL DATA:  Head trauma with headache. EXAM: CT HEAD WITHOUT CONTRAST TECHNIQUE: Contiguous axial images were obtained from the base of the skull through the vertex without intravenous contrast. COMPARISON:  None. FINDINGS: Brain: No subdural, epidural, or subarachnoid hemorrhage. Moderate white matter changes are identified. Cerebellum, brainstem, and basal cisterns are normal. Ventricles and sulci are prominent. No acute cortical ischemia or infarct. No mass effect or midline shift. Vascular: Calcified atherosclerosis is seen in the intracranial carotids. Skull: Normal. Negative for fracture or  focal lesion. Sinuses/Orbits: No acute finding. Other: None. IMPRESSION: 1. No acute intracranial abnormalities identified. Electronically Signed   By: Dorise Bullion III M.D   On: 02/17/2020 18:40    Procedures Procedures (including critical care time)  Medications Ordered in ED Medications  LORazepam (ATIVAN) tablet 0.5 mg (0.5 mg Oral Given 02/17/20 1812)    ED Course  I have reviewed the triage vital signs and the nursing notes.  Pertinent labs & imaging results that were available during my care of the patient were reviewed by me and considered in my medical decision making (see chart for details).    MDM Rules/Calculators/A&P                      Based on review of vitals, medical screening exam, lab work and/or imaging, there does not appear to be an acute, emergent etiology for the patient's symptoms. Counseled pt on good return precautions and encouraged both PCP and ED follow-up as needed.  Prior to discharge, I also discussed incidental imaging findings with patient in detail and advised appropriate, recommended follow-up in detail.  Clinical Impression: 1. Fall, initial encounter   2. Contusion of scalp, initial encounter     Disposition: Discharge  Prior to providing a prescription for a controlled substance, I independently reviewed the patient's recent prescription history on the [place]. The patient had no recent or regular prescriptions and was deemed appropriate for a brief, less than 3 day prescription of narcotic for acute analgesia.  This note was prepared with assistance of Systems analyst. Occasional wrong-word or sound-a-like substitutions may have occurred due to the inherent limitations of voice recognition software.  Final Clinical Impression(s) / ED Diagnoses Final diagnoses:  Fall, initial encounter  Contusion of scalp, initial encounter    Rx / DC Orders ED Discharge Orders    None         Kristine Royal 02/17/20 2043    Lacretia Leigh, MD 02/18/20 1715

## 2020-02-21 DIAGNOSIS — Z1159 Encounter for screening for other viral diseases: Secondary | ICD-10-CM | POA: Diagnosis not present

## 2020-02-21 DIAGNOSIS — Z20828 Contact with and (suspected) exposure to other viral communicable diseases: Secondary | ICD-10-CM | POA: Diagnosis not present

## 2020-02-28 DIAGNOSIS — Z1159 Encounter for screening for other viral diseases: Secondary | ICD-10-CM | POA: Diagnosis not present

## 2020-02-28 DIAGNOSIS — Z20828 Contact with and (suspected) exposure to other viral communicable diseases: Secondary | ICD-10-CM | POA: Diagnosis not present

## 2020-03-06 DIAGNOSIS — Z1159 Encounter for screening for other viral diseases: Secondary | ICD-10-CM | POA: Diagnosis not present

## 2020-03-06 DIAGNOSIS — Z20828 Contact with and (suspected) exposure to other viral communicable diseases: Secondary | ICD-10-CM | POA: Diagnosis not present

## 2020-03-13 DIAGNOSIS — Z1159 Encounter for screening for other viral diseases: Secondary | ICD-10-CM | POA: Diagnosis not present

## 2020-03-13 DIAGNOSIS — Z20828 Contact with and (suspected) exposure to other viral communicable diseases: Secondary | ICD-10-CM | POA: Diagnosis not present

## 2020-03-20 DIAGNOSIS — Z20828 Contact with and (suspected) exposure to other viral communicable diseases: Secondary | ICD-10-CM | POA: Diagnosis not present

## 2020-03-20 DIAGNOSIS — Z1159 Encounter for screening for other viral diseases: Secondary | ICD-10-CM | POA: Diagnosis not present

## 2020-03-27 DIAGNOSIS — Z20828 Contact with and (suspected) exposure to other viral communicable diseases: Secondary | ICD-10-CM | POA: Diagnosis not present

## 2020-03-27 DIAGNOSIS — Z1159 Encounter for screening for other viral diseases: Secondary | ICD-10-CM | POA: Diagnosis not present

## 2020-04-03 DIAGNOSIS — Z1159 Encounter for screening for other viral diseases: Secondary | ICD-10-CM | POA: Diagnosis not present

## 2020-04-03 DIAGNOSIS — Z20828 Contact with and (suspected) exposure to other viral communicable diseases: Secondary | ICD-10-CM | POA: Diagnosis not present

## 2020-04-10 DIAGNOSIS — Z20828 Contact with and (suspected) exposure to other viral communicable diseases: Secondary | ICD-10-CM | POA: Diagnosis not present

## 2020-04-10 DIAGNOSIS — Z1159 Encounter for screening for other viral diseases: Secondary | ICD-10-CM | POA: Diagnosis not present

## 2020-05-02 DIAGNOSIS — R278 Other lack of coordination: Secondary | ICD-10-CM | POA: Diagnosis not present

## 2020-05-02 DIAGNOSIS — R296 Repeated falls: Secondary | ICD-10-CM | POA: Diagnosis not present

## 2020-05-02 DIAGNOSIS — R2681 Unsteadiness on feet: Secondary | ICD-10-CM | POA: Diagnosis not present

## 2020-05-02 DIAGNOSIS — M6281 Muscle weakness (generalized): Secondary | ICD-10-CM | POA: Diagnosis not present

## 2020-05-03 DIAGNOSIS — R2681 Unsteadiness on feet: Secondary | ICD-10-CM | POA: Diagnosis not present

## 2020-05-03 DIAGNOSIS — R296 Repeated falls: Secondary | ICD-10-CM | POA: Diagnosis not present

## 2020-05-03 DIAGNOSIS — R278 Other lack of coordination: Secondary | ICD-10-CM | POA: Diagnosis not present

## 2020-05-03 DIAGNOSIS — M6281 Muscle weakness (generalized): Secondary | ICD-10-CM | POA: Diagnosis not present

## 2020-05-06 DIAGNOSIS — K5901 Slow transit constipation: Secondary | ICD-10-CM | POA: Diagnosis not present

## 2020-05-06 DIAGNOSIS — R2681 Unsteadiness on feet: Secondary | ICD-10-CM | POA: Diagnosis not present

## 2020-05-06 DIAGNOSIS — I129 Hypertensive chronic kidney disease with stage 1 through stage 4 chronic kidney disease, or unspecified chronic kidney disease: Secondary | ICD-10-CM | POA: Diagnosis not present

## 2020-05-06 DIAGNOSIS — N898 Other specified noninflammatory disorders of vagina: Secondary | ICD-10-CM | POA: Diagnosis not present

## 2020-05-06 DIAGNOSIS — M810 Age-related osteoporosis without current pathological fracture: Secondary | ICD-10-CM | POA: Diagnosis not present

## 2020-05-06 DIAGNOSIS — G301 Alzheimer's disease with late onset: Secondary | ICD-10-CM | POA: Diagnosis not present

## 2020-05-06 DIAGNOSIS — M6281 Muscle weakness (generalized): Secondary | ICD-10-CM | POA: Diagnosis not present

## 2020-05-06 DIAGNOSIS — N1832 Chronic kidney disease, stage 3b: Secondary | ICD-10-CM | POA: Diagnosis not present

## 2020-05-06 DIAGNOSIS — R296 Repeated falls: Secondary | ICD-10-CM | POA: Diagnosis not present

## 2020-05-06 DIAGNOSIS — F0280 Dementia in other diseases classified elsewhere without behavioral disturbance: Secondary | ICD-10-CM | POA: Diagnosis not present

## 2020-05-06 DIAGNOSIS — R7303 Prediabetes: Secondary | ICD-10-CM | POA: Diagnosis not present

## 2020-05-06 DIAGNOSIS — R278 Other lack of coordination: Secondary | ICD-10-CM | POA: Diagnosis not present

## 2020-05-07 DIAGNOSIS — R296 Repeated falls: Secondary | ICD-10-CM | POA: Diagnosis not present

## 2020-05-07 DIAGNOSIS — R278 Other lack of coordination: Secondary | ICD-10-CM | POA: Diagnosis not present

## 2020-05-07 DIAGNOSIS — R2681 Unsteadiness on feet: Secondary | ICD-10-CM | POA: Diagnosis not present

## 2020-05-07 DIAGNOSIS — M6281 Muscle weakness (generalized): Secondary | ICD-10-CM | POA: Diagnosis not present

## 2020-05-08 DIAGNOSIS — R296 Repeated falls: Secondary | ICD-10-CM | POA: Diagnosis not present

## 2020-05-08 DIAGNOSIS — M6281 Muscle weakness (generalized): Secondary | ICD-10-CM | POA: Diagnosis not present

## 2020-05-08 DIAGNOSIS — R2681 Unsteadiness on feet: Secondary | ICD-10-CM | POA: Diagnosis not present

## 2020-05-08 DIAGNOSIS — R278 Other lack of coordination: Secondary | ICD-10-CM | POA: Diagnosis not present

## 2020-05-09 DIAGNOSIS — R296 Repeated falls: Secondary | ICD-10-CM | POA: Diagnosis not present

## 2020-05-09 DIAGNOSIS — R2681 Unsteadiness on feet: Secondary | ICD-10-CM | POA: Diagnosis not present

## 2020-05-09 DIAGNOSIS — M6281 Muscle weakness (generalized): Secondary | ICD-10-CM | POA: Diagnosis not present

## 2020-05-09 DIAGNOSIS — R278 Other lack of coordination: Secondary | ICD-10-CM | POA: Diagnosis not present

## 2020-05-10 DIAGNOSIS — R296 Repeated falls: Secondary | ICD-10-CM | POA: Diagnosis not present

## 2020-05-10 DIAGNOSIS — R2681 Unsteadiness on feet: Secondary | ICD-10-CM | POA: Diagnosis not present

## 2020-05-10 DIAGNOSIS — M6281 Muscle weakness (generalized): Secondary | ICD-10-CM | POA: Diagnosis not present

## 2020-05-10 DIAGNOSIS — R278 Other lack of coordination: Secondary | ICD-10-CM | POA: Diagnosis not present

## 2020-05-14 DIAGNOSIS — R296 Repeated falls: Secondary | ICD-10-CM | POA: Diagnosis not present

## 2020-05-14 DIAGNOSIS — M6281 Muscle weakness (generalized): Secondary | ICD-10-CM | POA: Diagnosis not present

## 2020-05-14 DIAGNOSIS — R2681 Unsteadiness on feet: Secondary | ICD-10-CM | POA: Diagnosis not present

## 2020-05-14 DIAGNOSIS — R278 Other lack of coordination: Secondary | ICD-10-CM | POA: Diagnosis not present

## 2020-05-15 DIAGNOSIS — Z1159 Encounter for screening for other viral diseases: Secondary | ICD-10-CM | POA: Diagnosis not present

## 2020-05-15 DIAGNOSIS — Z20828 Contact with and (suspected) exposure to other viral communicable diseases: Secondary | ICD-10-CM | POA: Diagnosis not present

## 2020-05-15 DIAGNOSIS — R2681 Unsteadiness on feet: Secondary | ICD-10-CM | POA: Diagnosis not present

## 2020-05-15 DIAGNOSIS — M6281 Muscle weakness (generalized): Secondary | ICD-10-CM | POA: Diagnosis not present

## 2020-05-15 DIAGNOSIS — R296 Repeated falls: Secondary | ICD-10-CM | POA: Diagnosis not present

## 2020-05-15 DIAGNOSIS — L309 Dermatitis, unspecified: Secondary | ICD-10-CM | POA: Diagnosis not present

## 2020-05-15 DIAGNOSIS — R278 Other lack of coordination: Secondary | ICD-10-CM | POA: Diagnosis not present

## 2020-05-16 DIAGNOSIS — R278 Other lack of coordination: Secondary | ICD-10-CM | POA: Diagnosis not present

## 2020-05-16 DIAGNOSIS — R296 Repeated falls: Secondary | ICD-10-CM | POA: Diagnosis not present

## 2020-05-16 DIAGNOSIS — M6281 Muscle weakness (generalized): Secondary | ICD-10-CM | POA: Diagnosis not present

## 2020-05-16 DIAGNOSIS — R2681 Unsteadiness on feet: Secondary | ICD-10-CM | POA: Diagnosis not present

## 2020-05-17 DIAGNOSIS — R296 Repeated falls: Secondary | ICD-10-CM | POA: Diagnosis not present

## 2020-05-17 DIAGNOSIS — R278 Other lack of coordination: Secondary | ICD-10-CM | POA: Diagnosis not present

## 2020-05-17 DIAGNOSIS — M6281 Muscle weakness (generalized): Secondary | ICD-10-CM | POA: Diagnosis not present

## 2020-05-17 DIAGNOSIS — R2681 Unsteadiness on feet: Secondary | ICD-10-CM | POA: Diagnosis not present

## 2020-05-18 DIAGNOSIS — R2681 Unsteadiness on feet: Secondary | ICD-10-CM | POA: Diagnosis not present

## 2020-05-18 DIAGNOSIS — M6281 Muscle weakness (generalized): Secondary | ICD-10-CM | POA: Diagnosis not present

## 2020-05-18 DIAGNOSIS — R278 Other lack of coordination: Secondary | ICD-10-CM | POA: Diagnosis not present

## 2020-05-18 DIAGNOSIS — R296 Repeated falls: Secondary | ICD-10-CM | POA: Diagnosis not present

## 2020-05-20 DIAGNOSIS — R296 Repeated falls: Secondary | ICD-10-CM | POA: Diagnosis not present

## 2020-05-20 DIAGNOSIS — R278 Other lack of coordination: Secondary | ICD-10-CM | POA: Diagnosis not present

## 2020-05-20 DIAGNOSIS — M6281 Muscle weakness (generalized): Secondary | ICD-10-CM | POA: Diagnosis not present

## 2020-05-20 DIAGNOSIS — R2681 Unsteadiness on feet: Secondary | ICD-10-CM | POA: Diagnosis not present

## 2020-05-21 DIAGNOSIS — R278 Other lack of coordination: Secondary | ICD-10-CM | POA: Diagnosis not present

## 2020-05-21 DIAGNOSIS — R296 Repeated falls: Secondary | ICD-10-CM | POA: Diagnosis not present

## 2020-05-21 DIAGNOSIS — M6281 Muscle weakness (generalized): Secondary | ICD-10-CM | POA: Diagnosis not present

## 2020-05-21 DIAGNOSIS — R2681 Unsteadiness on feet: Secondary | ICD-10-CM | POA: Diagnosis not present

## 2020-05-22 DIAGNOSIS — Z20828 Contact with and (suspected) exposure to other viral communicable diseases: Secondary | ICD-10-CM | POA: Diagnosis not present

## 2020-05-22 DIAGNOSIS — Z1159 Encounter for screening for other viral diseases: Secondary | ICD-10-CM | POA: Diagnosis not present

## 2020-05-22 DIAGNOSIS — M6281 Muscle weakness (generalized): Secondary | ICD-10-CM | POA: Diagnosis not present

## 2020-05-22 DIAGNOSIS — R2681 Unsteadiness on feet: Secondary | ICD-10-CM | POA: Diagnosis not present

## 2020-05-22 DIAGNOSIS — R278 Other lack of coordination: Secondary | ICD-10-CM | POA: Diagnosis not present

## 2020-05-22 DIAGNOSIS — R296 Repeated falls: Secondary | ICD-10-CM | POA: Diagnosis not present

## 2020-05-23 DIAGNOSIS — R296 Repeated falls: Secondary | ICD-10-CM | POA: Diagnosis not present

## 2020-05-23 DIAGNOSIS — R2681 Unsteadiness on feet: Secondary | ICD-10-CM | POA: Diagnosis not present

## 2020-05-23 DIAGNOSIS — M6281 Muscle weakness (generalized): Secondary | ICD-10-CM | POA: Diagnosis not present

## 2020-05-23 DIAGNOSIS — R278 Other lack of coordination: Secondary | ICD-10-CM | POA: Diagnosis not present

## 2020-05-24 DIAGNOSIS — R278 Other lack of coordination: Secondary | ICD-10-CM | POA: Diagnosis not present

## 2020-05-24 DIAGNOSIS — R296 Repeated falls: Secondary | ICD-10-CM | POA: Diagnosis not present

## 2020-05-24 DIAGNOSIS — R2681 Unsteadiness on feet: Secondary | ICD-10-CM | POA: Diagnosis not present

## 2020-05-24 DIAGNOSIS — M6281 Muscle weakness (generalized): Secondary | ICD-10-CM | POA: Diagnosis not present

## 2020-05-27 DIAGNOSIS — R296 Repeated falls: Secondary | ICD-10-CM | POA: Diagnosis not present

## 2020-05-27 DIAGNOSIS — R2681 Unsteadiness on feet: Secondary | ICD-10-CM | POA: Diagnosis not present

## 2020-05-27 DIAGNOSIS — R278 Other lack of coordination: Secondary | ICD-10-CM | POA: Diagnosis not present

## 2020-05-27 DIAGNOSIS — M6281 Muscle weakness (generalized): Secondary | ICD-10-CM | POA: Diagnosis not present

## 2020-05-28 DIAGNOSIS — R2681 Unsteadiness on feet: Secondary | ICD-10-CM | POA: Diagnosis not present

## 2020-05-28 DIAGNOSIS — R296 Repeated falls: Secondary | ICD-10-CM | POA: Diagnosis not present

## 2020-05-28 DIAGNOSIS — M6281 Muscle weakness (generalized): Secondary | ICD-10-CM | POA: Diagnosis not present

## 2020-05-28 DIAGNOSIS — R278 Other lack of coordination: Secondary | ICD-10-CM | POA: Diagnosis not present

## 2020-05-29 DIAGNOSIS — R278 Other lack of coordination: Secondary | ICD-10-CM | POA: Diagnosis not present

## 2020-05-29 DIAGNOSIS — M6281 Muscle weakness (generalized): Secondary | ICD-10-CM | POA: Diagnosis not present

## 2020-05-29 DIAGNOSIS — R296 Repeated falls: Secondary | ICD-10-CM | POA: Diagnosis not present

## 2020-05-29 DIAGNOSIS — Z1159 Encounter for screening for other viral diseases: Secondary | ICD-10-CM | POA: Diagnosis not present

## 2020-05-29 DIAGNOSIS — Z20828 Contact with and (suspected) exposure to other viral communicable diseases: Secondary | ICD-10-CM | POA: Diagnosis not present

## 2020-05-29 DIAGNOSIS — R2681 Unsteadiness on feet: Secondary | ICD-10-CM | POA: Diagnosis not present

## 2020-05-30 DIAGNOSIS — R296 Repeated falls: Secondary | ICD-10-CM | POA: Diagnosis not present

## 2020-05-30 DIAGNOSIS — M6281 Muscle weakness (generalized): Secondary | ICD-10-CM | POA: Diagnosis not present

## 2020-05-30 DIAGNOSIS — R278 Other lack of coordination: Secondary | ICD-10-CM | POA: Diagnosis not present

## 2020-05-30 DIAGNOSIS — R2681 Unsteadiness on feet: Secondary | ICD-10-CM | POA: Diagnosis not present

## 2020-05-31 DIAGNOSIS — R2681 Unsteadiness on feet: Secondary | ICD-10-CM | POA: Diagnosis not present

## 2020-05-31 DIAGNOSIS — R296 Repeated falls: Secondary | ICD-10-CM | POA: Diagnosis not present

## 2020-05-31 DIAGNOSIS — R278 Other lack of coordination: Secondary | ICD-10-CM | POA: Diagnosis not present

## 2020-05-31 DIAGNOSIS — M6281 Muscle weakness (generalized): Secondary | ICD-10-CM | POA: Diagnosis not present

## 2020-06-03 DIAGNOSIS — R2681 Unsteadiness on feet: Secondary | ICD-10-CM | POA: Diagnosis not present

## 2020-06-03 DIAGNOSIS — M6281 Muscle weakness (generalized): Secondary | ICD-10-CM | POA: Diagnosis not present

## 2020-06-03 DIAGNOSIS — R278 Other lack of coordination: Secondary | ICD-10-CM | POA: Diagnosis not present

## 2020-06-03 DIAGNOSIS — R296 Repeated falls: Secondary | ICD-10-CM | POA: Diagnosis not present

## 2020-06-04 DIAGNOSIS — R296 Repeated falls: Secondary | ICD-10-CM | POA: Diagnosis not present

## 2020-06-04 DIAGNOSIS — M6281 Muscle weakness (generalized): Secondary | ICD-10-CM | POA: Diagnosis not present

## 2020-06-04 DIAGNOSIS — R278 Other lack of coordination: Secondary | ICD-10-CM | POA: Diagnosis not present

## 2020-06-04 DIAGNOSIS — R2681 Unsteadiness on feet: Secondary | ICD-10-CM | POA: Diagnosis not present

## 2020-06-05 DIAGNOSIS — R278 Other lack of coordination: Secondary | ICD-10-CM | POA: Diagnosis not present

## 2020-06-05 DIAGNOSIS — Z20828 Contact with and (suspected) exposure to other viral communicable diseases: Secondary | ICD-10-CM | POA: Diagnosis not present

## 2020-06-05 DIAGNOSIS — R296 Repeated falls: Secondary | ICD-10-CM | POA: Diagnosis not present

## 2020-06-05 DIAGNOSIS — Z1159 Encounter for screening for other viral diseases: Secondary | ICD-10-CM | POA: Diagnosis not present

## 2020-06-05 DIAGNOSIS — R2681 Unsteadiness on feet: Secondary | ICD-10-CM | POA: Diagnosis not present

## 2020-06-05 DIAGNOSIS — M6281 Muscle weakness (generalized): Secondary | ICD-10-CM | POA: Diagnosis not present

## 2020-06-06 DIAGNOSIS — R296 Repeated falls: Secondary | ICD-10-CM | POA: Diagnosis not present

## 2020-06-06 DIAGNOSIS — R2681 Unsteadiness on feet: Secondary | ICD-10-CM | POA: Diagnosis not present

## 2020-06-06 DIAGNOSIS — R278 Other lack of coordination: Secondary | ICD-10-CM | POA: Diagnosis not present

## 2020-06-06 DIAGNOSIS — M6281 Muscle weakness (generalized): Secondary | ICD-10-CM | POA: Diagnosis not present

## 2020-06-07 DIAGNOSIS — R278 Other lack of coordination: Secondary | ICD-10-CM | POA: Diagnosis not present

## 2020-06-07 DIAGNOSIS — R296 Repeated falls: Secondary | ICD-10-CM | POA: Diagnosis not present

## 2020-06-07 DIAGNOSIS — R2681 Unsteadiness on feet: Secondary | ICD-10-CM | POA: Diagnosis not present

## 2020-06-07 DIAGNOSIS — M6281 Muscle weakness (generalized): Secondary | ICD-10-CM | POA: Diagnosis not present

## 2020-06-08 DIAGNOSIS — R278 Other lack of coordination: Secondary | ICD-10-CM | POA: Diagnosis not present

## 2020-06-08 DIAGNOSIS — R296 Repeated falls: Secondary | ICD-10-CM | POA: Diagnosis not present

## 2020-06-08 DIAGNOSIS — R2681 Unsteadiness on feet: Secondary | ICD-10-CM | POA: Diagnosis not present

## 2020-06-08 DIAGNOSIS — M6281 Muscle weakness (generalized): Secondary | ICD-10-CM | POA: Diagnosis not present

## 2020-06-10 DIAGNOSIS — R2681 Unsteadiness on feet: Secondary | ICD-10-CM | POA: Diagnosis not present

## 2020-06-10 DIAGNOSIS — R278 Other lack of coordination: Secondary | ICD-10-CM | POA: Diagnosis not present

## 2020-06-10 DIAGNOSIS — R296 Repeated falls: Secondary | ICD-10-CM | POA: Diagnosis not present

## 2020-06-10 DIAGNOSIS — M6281 Muscle weakness (generalized): Secondary | ICD-10-CM | POA: Diagnosis not present

## 2020-06-11 DIAGNOSIS — M6281 Muscle weakness (generalized): Secondary | ICD-10-CM | POA: Diagnosis not present

## 2020-06-11 DIAGNOSIS — R2681 Unsteadiness on feet: Secondary | ICD-10-CM | POA: Diagnosis not present

## 2020-06-11 DIAGNOSIS — R296 Repeated falls: Secondary | ICD-10-CM | POA: Diagnosis not present

## 2020-06-11 DIAGNOSIS — R278 Other lack of coordination: Secondary | ICD-10-CM | POA: Diagnosis not present

## 2020-06-12 DIAGNOSIS — Z20828 Contact with and (suspected) exposure to other viral communicable diseases: Secondary | ICD-10-CM | POA: Diagnosis not present

## 2020-06-12 DIAGNOSIS — Z1159 Encounter for screening for other viral diseases: Secondary | ICD-10-CM | POA: Diagnosis not present

## 2020-06-12 DIAGNOSIS — R278 Other lack of coordination: Secondary | ICD-10-CM | POA: Diagnosis not present

## 2020-06-12 DIAGNOSIS — R2681 Unsteadiness on feet: Secondary | ICD-10-CM | POA: Diagnosis not present

## 2020-06-12 DIAGNOSIS — R296 Repeated falls: Secondary | ICD-10-CM | POA: Diagnosis not present

## 2020-06-12 DIAGNOSIS — M6281 Muscle weakness (generalized): Secondary | ICD-10-CM | POA: Diagnosis not present

## 2020-06-13 DIAGNOSIS — R278 Other lack of coordination: Secondary | ICD-10-CM | POA: Diagnosis not present

## 2020-06-13 DIAGNOSIS — M6281 Muscle weakness (generalized): Secondary | ICD-10-CM | POA: Diagnosis not present

## 2020-06-13 DIAGNOSIS — R296 Repeated falls: Secondary | ICD-10-CM | POA: Diagnosis not present

## 2020-06-13 DIAGNOSIS — R2681 Unsteadiness on feet: Secondary | ICD-10-CM | POA: Diagnosis not present

## 2020-06-17 DIAGNOSIS — R278 Other lack of coordination: Secondary | ICD-10-CM | POA: Diagnosis not present

## 2020-06-17 DIAGNOSIS — R2681 Unsteadiness on feet: Secondary | ICD-10-CM | POA: Diagnosis not present

## 2020-06-17 DIAGNOSIS — M6281 Muscle weakness (generalized): Secondary | ICD-10-CM | POA: Diagnosis not present

## 2020-06-17 DIAGNOSIS — R296 Repeated falls: Secondary | ICD-10-CM | POA: Diagnosis not present

## 2020-06-18 DIAGNOSIS — R278 Other lack of coordination: Secondary | ICD-10-CM | POA: Diagnosis not present

## 2020-06-18 DIAGNOSIS — R296 Repeated falls: Secondary | ICD-10-CM | POA: Diagnosis not present

## 2020-06-18 DIAGNOSIS — R2681 Unsteadiness on feet: Secondary | ICD-10-CM | POA: Diagnosis not present

## 2020-06-18 DIAGNOSIS — M6281 Muscle weakness (generalized): Secondary | ICD-10-CM | POA: Diagnosis not present

## 2020-06-19 DIAGNOSIS — Z20828 Contact with and (suspected) exposure to other viral communicable diseases: Secondary | ICD-10-CM | POA: Diagnosis not present

## 2020-06-19 DIAGNOSIS — Z1159 Encounter for screening for other viral diseases: Secondary | ICD-10-CM | POA: Diagnosis not present

## 2020-06-20 DIAGNOSIS — R278 Other lack of coordination: Secondary | ICD-10-CM | POA: Diagnosis not present

## 2020-06-20 DIAGNOSIS — R2681 Unsteadiness on feet: Secondary | ICD-10-CM | POA: Diagnosis not present

## 2020-06-20 DIAGNOSIS — M6281 Muscle weakness (generalized): Secondary | ICD-10-CM | POA: Diagnosis not present

## 2020-06-20 DIAGNOSIS — R296 Repeated falls: Secondary | ICD-10-CM | POA: Diagnosis not present

## 2020-06-21 DIAGNOSIS — R278 Other lack of coordination: Secondary | ICD-10-CM | POA: Diagnosis not present

## 2020-06-21 DIAGNOSIS — M6281 Muscle weakness (generalized): Secondary | ICD-10-CM | POA: Diagnosis not present

## 2020-06-21 DIAGNOSIS — R2681 Unsteadiness on feet: Secondary | ICD-10-CM | POA: Diagnosis not present

## 2020-06-21 DIAGNOSIS — R296 Repeated falls: Secondary | ICD-10-CM | POA: Diagnosis not present

## 2020-06-25 DIAGNOSIS — R296 Repeated falls: Secondary | ICD-10-CM | POA: Diagnosis not present

## 2020-06-25 DIAGNOSIS — R2681 Unsteadiness on feet: Secondary | ICD-10-CM | POA: Diagnosis not present

## 2020-06-25 DIAGNOSIS — M6281 Muscle weakness (generalized): Secondary | ICD-10-CM | POA: Diagnosis not present

## 2020-06-25 DIAGNOSIS — R278 Other lack of coordination: Secondary | ICD-10-CM | POA: Diagnosis not present

## 2020-06-26 DIAGNOSIS — Z1159 Encounter for screening for other viral diseases: Secondary | ICD-10-CM | POA: Diagnosis not present

## 2020-06-26 DIAGNOSIS — Z20828 Contact with and (suspected) exposure to other viral communicable diseases: Secondary | ICD-10-CM | POA: Diagnosis not present

## 2020-06-26 DIAGNOSIS — R296 Repeated falls: Secondary | ICD-10-CM | POA: Diagnosis not present

## 2020-06-26 DIAGNOSIS — R278 Other lack of coordination: Secondary | ICD-10-CM | POA: Diagnosis not present

## 2020-06-26 DIAGNOSIS — M6281 Muscle weakness (generalized): Secondary | ICD-10-CM | POA: Diagnosis not present

## 2020-06-26 DIAGNOSIS — R2681 Unsteadiness on feet: Secondary | ICD-10-CM | POA: Diagnosis not present

## 2020-06-27 DIAGNOSIS — R296 Repeated falls: Secondary | ICD-10-CM | POA: Diagnosis not present

## 2020-06-27 DIAGNOSIS — R278 Other lack of coordination: Secondary | ICD-10-CM | POA: Diagnosis not present

## 2020-06-27 DIAGNOSIS — R2681 Unsteadiness on feet: Secondary | ICD-10-CM | POA: Diagnosis not present

## 2020-06-27 DIAGNOSIS — M6281 Muscle weakness (generalized): Secondary | ICD-10-CM | POA: Diagnosis not present

## 2020-06-28 DIAGNOSIS — M6281 Muscle weakness (generalized): Secondary | ICD-10-CM | POA: Diagnosis not present

## 2020-06-28 DIAGNOSIS — R2681 Unsteadiness on feet: Secondary | ICD-10-CM | POA: Diagnosis not present

## 2020-06-28 DIAGNOSIS — R296 Repeated falls: Secondary | ICD-10-CM | POA: Diagnosis not present

## 2020-06-28 DIAGNOSIS — R278 Other lack of coordination: Secondary | ICD-10-CM | POA: Diagnosis not present

## 2020-07-01 DIAGNOSIS — R296 Repeated falls: Secondary | ICD-10-CM | POA: Diagnosis not present

## 2020-07-01 DIAGNOSIS — R278 Other lack of coordination: Secondary | ICD-10-CM | POA: Diagnosis not present

## 2020-07-01 DIAGNOSIS — R2681 Unsteadiness on feet: Secondary | ICD-10-CM | POA: Diagnosis not present

## 2020-07-01 DIAGNOSIS — M6281 Muscle weakness (generalized): Secondary | ICD-10-CM | POA: Diagnosis not present

## 2020-07-02 DIAGNOSIS — R2681 Unsteadiness on feet: Secondary | ICD-10-CM | POA: Diagnosis not present

## 2020-07-02 DIAGNOSIS — R278 Other lack of coordination: Secondary | ICD-10-CM | POA: Diagnosis not present

## 2020-07-02 DIAGNOSIS — M6281 Muscle weakness (generalized): Secondary | ICD-10-CM | POA: Diagnosis not present

## 2020-07-02 DIAGNOSIS — R296 Repeated falls: Secondary | ICD-10-CM | POA: Diagnosis not present

## 2020-07-03 DIAGNOSIS — Z20828 Contact with and (suspected) exposure to other viral communicable diseases: Secondary | ICD-10-CM | POA: Diagnosis not present

## 2020-07-03 DIAGNOSIS — M6281 Muscle weakness (generalized): Secondary | ICD-10-CM | POA: Diagnosis not present

## 2020-07-03 DIAGNOSIS — R296 Repeated falls: Secondary | ICD-10-CM | POA: Diagnosis not present

## 2020-07-03 DIAGNOSIS — Z1159 Encounter for screening for other viral diseases: Secondary | ICD-10-CM | POA: Diagnosis not present

## 2020-07-03 DIAGNOSIS — R2681 Unsteadiness on feet: Secondary | ICD-10-CM | POA: Diagnosis not present

## 2020-07-03 DIAGNOSIS — R278 Other lack of coordination: Secondary | ICD-10-CM | POA: Diagnosis not present

## 2020-07-04 DIAGNOSIS — R2681 Unsteadiness on feet: Secondary | ICD-10-CM | POA: Diagnosis not present

## 2020-07-04 DIAGNOSIS — R278 Other lack of coordination: Secondary | ICD-10-CM | POA: Diagnosis not present

## 2020-07-04 DIAGNOSIS — M6281 Muscle weakness (generalized): Secondary | ICD-10-CM | POA: Diagnosis not present

## 2020-07-04 DIAGNOSIS — R296 Repeated falls: Secondary | ICD-10-CM | POA: Diagnosis not present

## 2020-07-08 DIAGNOSIS — R2681 Unsteadiness on feet: Secondary | ICD-10-CM | POA: Diagnosis not present

## 2020-07-08 DIAGNOSIS — R296 Repeated falls: Secondary | ICD-10-CM | POA: Diagnosis not present

## 2020-07-08 DIAGNOSIS — R278 Other lack of coordination: Secondary | ICD-10-CM | POA: Diagnosis not present

## 2020-07-08 DIAGNOSIS — M6281 Muscle weakness (generalized): Secondary | ICD-10-CM | POA: Diagnosis not present

## 2020-07-09 DIAGNOSIS — R296 Repeated falls: Secondary | ICD-10-CM | POA: Diagnosis not present

## 2020-07-09 DIAGNOSIS — R278 Other lack of coordination: Secondary | ICD-10-CM | POA: Diagnosis not present

## 2020-07-09 DIAGNOSIS — M6281 Muscle weakness (generalized): Secondary | ICD-10-CM | POA: Diagnosis not present

## 2020-07-09 DIAGNOSIS — R2681 Unsteadiness on feet: Secondary | ICD-10-CM | POA: Diagnosis not present

## 2020-07-10 ENCOUNTER — Encounter (HOSPITAL_COMMUNITY): Payer: Self-pay | Admitting: Emergency Medicine

## 2020-07-10 ENCOUNTER — Emergency Department (HOSPITAL_COMMUNITY): Payer: PPO

## 2020-07-10 ENCOUNTER — Inpatient Hospital Stay (HOSPITAL_COMMUNITY)
Admission: EM | Admit: 2020-07-10 | Discharge: 2020-07-12 | DRG: 309 | Disposition: A | Discharge status: Home / Self Care | Payer: PPO | Source: Skilled Nursing Facility | Attending: Cardiology | Admitting: Cardiology

## 2020-07-10 DIAGNOSIS — Z951 Presence of aortocoronary bypass graft: Secondary | ICD-10-CM

## 2020-07-10 DIAGNOSIS — R531 Weakness: Secondary | ICD-10-CM | POA: Diagnosis not present

## 2020-07-10 DIAGNOSIS — I4892 Unspecified atrial flutter: Principal | ICD-10-CM | POA: Diagnosis present

## 2020-07-10 DIAGNOSIS — N390 Urinary tract infection, site not specified: Secondary | ICD-10-CM

## 2020-07-10 DIAGNOSIS — I129 Hypertensive chronic kidney disease with stage 1 through stage 4 chronic kidney disease, or unspecified chronic kidney disease: Secondary | ICD-10-CM | POA: Diagnosis present

## 2020-07-10 DIAGNOSIS — I10 Essential (primary) hypertension: Secondary | ICD-10-CM | POA: Diagnosis present

## 2020-07-10 DIAGNOSIS — Z7401 Bed confinement status: Secondary | ICD-10-CM | POA: Diagnosis not present

## 2020-07-10 DIAGNOSIS — Z8249 Family history of ischemic heart disease and other diseases of the circulatory system: Secondary | ICD-10-CM | POA: Diagnosis not present

## 2020-07-10 DIAGNOSIS — Z20822 Contact with and (suspected) exposure to covid-19: Secondary | ICD-10-CM | POA: Diagnosis not present

## 2020-07-10 DIAGNOSIS — N1832 Chronic kidney disease, stage 3b: Secondary | ICD-10-CM | POA: Diagnosis not present

## 2020-07-10 DIAGNOSIS — E7800 Pure hypercholesterolemia, unspecified: Secondary | ICD-10-CM | POA: Diagnosis not present

## 2020-07-10 DIAGNOSIS — I340 Nonrheumatic mitral (valve) insufficiency: Secondary | ICD-10-CM | POA: Diagnosis not present

## 2020-07-10 DIAGNOSIS — Z923 Personal history of irradiation: Secondary | ICD-10-CM | POA: Diagnosis not present

## 2020-07-10 DIAGNOSIS — I4891 Unspecified atrial fibrillation: Secondary | ICD-10-CM | POA: Diagnosis not present

## 2020-07-10 DIAGNOSIS — R2681 Unsteadiness on feet: Secondary | ICD-10-CM | POA: Diagnosis not present

## 2020-07-10 DIAGNOSIS — Z905 Acquired absence of kidney: Secondary | ICD-10-CM | POA: Diagnosis not present

## 2020-07-10 DIAGNOSIS — F0391 Unspecified dementia with behavioral disturbance: Secondary | ICD-10-CM

## 2020-07-10 DIAGNOSIS — I351 Nonrheumatic aortic (valve) insufficiency: Secondary | ICD-10-CM | POA: Diagnosis not present

## 2020-07-10 DIAGNOSIS — I361 Nonrheumatic tricuspid (valve) insufficiency: Secondary | ICD-10-CM | POA: Diagnosis not present

## 2020-07-10 DIAGNOSIS — Z853 Personal history of malignant neoplasm of breast: Secondary | ICD-10-CM | POA: Diagnosis not present

## 2020-07-10 DIAGNOSIS — F0390 Unspecified dementia without behavioral disturbance: Secondary | ICD-10-CM | POA: Diagnosis present

## 2020-07-10 DIAGNOSIS — I2510 Atherosclerotic heart disease of native coronary artery without angina pectoris: Secondary | ICD-10-CM | POA: Diagnosis present

## 2020-07-10 DIAGNOSIS — Z85038 Personal history of other malignant neoplasm of large intestine: Secondary | ICD-10-CM | POA: Diagnosis not present

## 2020-07-10 DIAGNOSIS — I25810 Atherosclerosis of coronary artery bypass graft(s) without angina pectoris: Secondary | ICD-10-CM

## 2020-07-10 DIAGNOSIS — M6281 Muscle weakness (generalized): Secondary | ICD-10-CM | POA: Diagnosis not present

## 2020-07-10 DIAGNOSIS — R0902 Hypoxemia: Secondary | ICD-10-CM | POA: Diagnosis not present

## 2020-07-10 DIAGNOSIS — E785 Hyperlipidemia, unspecified: Secondary | ICD-10-CM | POA: Diagnosis present

## 2020-07-10 DIAGNOSIS — Z7901 Long term (current) use of anticoagulants: Secondary | ICD-10-CM

## 2020-07-10 DIAGNOSIS — R000 Tachycardia, unspecified: Secondary | ICD-10-CM | POA: Diagnosis not present

## 2020-07-10 DIAGNOSIS — C189 Malignant neoplasm of colon, unspecified: Secondary | ICD-10-CM | POA: Diagnosis not present

## 2020-07-10 DIAGNOSIS — R296 Repeated falls: Secondary | ICD-10-CM | POA: Diagnosis not present

## 2020-07-10 DIAGNOSIS — R278 Other lack of coordination: Secondary | ICD-10-CM | POA: Diagnosis not present

## 2020-07-10 DIAGNOSIS — L8990 Pressure ulcer of unspecified site, unspecified stage: Secondary | ICD-10-CM | POA: Diagnosis not present

## 2020-07-10 DIAGNOSIS — F03918 Unspecified dementia, unspecified severity, with other behavioral disturbance: Secondary | ICD-10-CM

## 2020-07-10 DIAGNOSIS — K5900 Constipation, unspecified: Secondary | ICD-10-CM | POA: Diagnosis not present

## 2020-07-10 DIAGNOSIS — J9811 Atelectasis: Secondary | ICD-10-CM | POA: Diagnosis not present

## 2020-07-10 DIAGNOSIS — M2550 Pain in unspecified joint: Secondary | ICD-10-CM | POA: Diagnosis not present

## 2020-07-10 DIAGNOSIS — R404 Transient alteration of awareness: Secondary | ICD-10-CM | POA: Diagnosis not present

## 2020-07-10 LAB — COMPREHENSIVE METABOLIC PANEL
ALT: 16 U/L (ref 0–44)
AST: 21 U/L (ref 15–41)
Albumin: 3.4 g/dL — ABNORMAL LOW (ref 3.5–5.0)
Alkaline Phosphatase: 38 U/L (ref 38–126)
Anion gap: 13 (ref 5–15)
BUN: 22 mg/dL (ref 8–23)
CO2: 28 mmol/L (ref 22–32)
Calcium: 9.4 mg/dL (ref 8.9–10.3)
Chloride: 102 mmol/L (ref 98–111)
Creatinine, Ser: 1.23 mg/dL — ABNORMAL HIGH (ref 0.44–1.00)
GFR calc Af Amer: 47 mL/min — ABNORMAL LOW (ref >60)
GFR calc non Af Amer: 40 mL/min — ABNORMAL LOW (ref >60)
Glucose, Bld: 103 mg/dL — ABNORMAL HIGH (ref 70–99)
Potassium: 3.6 mmol/L (ref 3.5–5.1)
Sodium: 143 mmol/L (ref 135–145)
Total Bilirubin: 0.9 mg/dL (ref 0.3–1.2)
Total Protein: 6.5 g/dL (ref 6.5–8.1)

## 2020-07-10 LAB — URINALYSIS, ROUTINE W REFLEX MICROSCOPIC
Bilirubin Urine: NEGATIVE
Glucose, UA: NEGATIVE mg/dL
Hgb urine dipstick: NEGATIVE
Ketones, ur: NEGATIVE mg/dL
Nitrite: NEGATIVE
Protein, ur: 30 mg/dL — AB
Specific Gravity, Urine: 1.015 (ref 1.005–1.030)
pH: 6 (ref 5.0–8.0)

## 2020-07-10 LAB — CBC WITH DIFFERENTIAL/PLATELET
Abs Immature Granulocytes: 0.08 10*3/uL — ABNORMAL HIGH (ref 0.00–0.07)
Basophils Absolute: 0.1 10*3/uL (ref 0.0–0.1)
Basophils Relative: 1 %
Eosinophils Absolute: 0.2 10*3/uL (ref 0.0–0.5)
Eosinophils Relative: 2 %
HCT: 43.3 % (ref 36.0–46.0)
Hemoglobin: 13.8 g/dL (ref 12.0–15.0)
Immature Granulocytes: 1 %
Lymphocytes Relative: 28 %
Lymphs Abs: 2.6 10*3/uL (ref 0.7–4.0)
MCH: 28.3 pg (ref 26.0–34.0)
MCHC: 31.9 g/dL (ref 30.0–36.0)
MCV: 88.9 fL (ref 80.0–100.0)
Monocytes Absolute: 0.8 10*3/uL (ref 0.1–1.0)
Monocytes Relative: 9 %
Neutro Abs: 5.4 10*3/uL (ref 1.7–7.7)
Neutrophils Relative %: 59 %
Platelets: 191 10*3/uL (ref 150–400)
RBC: 4.87 MIL/uL (ref 3.87–5.11)
RDW: 14.3 % (ref 11.5–15.5)
WBC: 9.2 10*3/uL (ref 4.0–10.5)
nRBC: 0 % (ref 0.0–0.2)

## 2020-07-10 LAB — MAGNESIUM: Magnesium: 1.9 mg/dL (ref 1.7–2.4)

## 2020-07-10 LAB — SARS CORONAVIRUS 2 BY RT PCR (HOSPITAL ORDER, PERFORMED IN ~~LOC~~ HOSPITAL LAB): SARS Coronavirus 2: NEGATIVE

## 2020-07-10 LAB — TROPONIN I (HIGH SENSITIVITY)
Troponin I (High Sensitivity): 17 ng/L (ref <18)
Troponin I (High Sensitivity): 27 ng/L — ABNORMAL HIGH (ref <18)

## 2020-07-10 MED ORDER — ONDANSETRON HCL 4 MG/2ML IJ SOLN: 4.0000 mg | Freq: Four times a day (QID) | INTRAMUSCULAR | Status: DC | PRN

## 2020-07-10 MED ORDER — ASPIRIN EC 81 MG PO TBEC
81.0000 mg | DELAYED_RELEASE_TABLET | Freq: Every day | ORAL | Status: DC
  Administered 2020-07-10 – 2020-07-12 (×3): 81 mg via ORAL
  Filled 2020-07-10 (×3): qty 1

## 2020-07-10 MED ORDER — ACETAMINOPHEN 325 MG PO TABS
650.0000 mg | ORAL_TABLET | ORAL | Status: DC | PRN
  Administered 2020-07-11: 650 mg via ORAL
  Filled 2020-07-10: qty 2

## 2020-07-10 MED ORDER — HEPARIN BOLUS VIA INFUSION
3000.0000 [IU] | Freq: Once | INTRAVENOUS | Status: AC
  Administered 2020-07-10: 3000 [IU] via INTRAVENOUS
  Filled 2020-07-10: qty 3000

## 2020-07-10 MED ORDER — SODIUM CHLORIDE 0.9 % IV BOLUS
500.0000 mL | Freq: Once | INTRAVENOUS | Status: AC
  Administered 2020-07-10: 13:00:00 500 mL via INTRAVENOUS

## 2020-07-10 MED ORDER — DILTIAZEM HCL 25 MG/5ML IV SOLN
10.0000 mg | Freq: Once | INTRAVENOUS | Status: DC
  Filled 2020-07-10: qty 5

## 2020-07-10 MED ORDER — DILTIAZEM HCL-DEXTROSE 125-5 MG/125ML-% IV SOLN (PREMIX)
5.0000 mg/h | INTRAVENOUS | Status: DC
  Administered 2020-07-10: 5 mg/h via INTRAVENOUS
  Filled 2020-07-10 (×2): qty 125

## 2020-07-10 MED ORDER — DILTIAZEM HCL 25 MG/5ML IV SOLN
10.0000 mg | Freq: Once | INTRAVENOUS | Status: AC
  Administered 2020-07-10: 13:00:00 10 mg via INTRAVENOUS
  Filled 2020-07-10: qty 5

## 2020-07-10 MED ORDER — HEPARIN (PORCINE) 25000 UT/250ML-% IV SOLN
800.0000 [IU]/h | INTRAVENOUS | Status: DC
  Administered 2020-07-10 – 2020-07-11 (×2): 800 [IU]/h via INTRAVENOUS
  Filled 2020-07-10 (×2): qty 250

## 2020-07-10 MED ORDER — ATENOLOL 25 MG PO TABS
25.0000 mg | ORAL_TABLET | Freq: Every morning | ORAL | Status: DC
  Administered 2020-07-11 – 2020-07-12 (×2): 25 mg via ORAL
  Filled 2020-07-10 (×2): qty 1

## 2020-07-10 NOTE — H&P (Signed)
Cardiology H&P:   Patient ID: MARIE BOROWSKI MRN: [id number]; DOB: [birth year]  Admit date: 07/10/2020 Date of Consult: 07/10/2020  Primary Care Provider: Lajean Manes, MD [facility] Cardiologist: Dr. Daneen Schick   Patient Profile:   Marie [facility]urch is a 86 y.o. female with a hx of CAD, s/p CABG, PSVT who is being seen today for the evaluation of atrial flutter with RVR at the request of Eustaquio Maize, PA-C.  History of Present Illness:   Marie [facility]urch has h/o coronary artery disease with prior bypass surgery in 2003, mitral valve repair 2003, hyperlipidemia, hypertension, PSVT and chronic kidney disease, dementia residing in a nursing home. She was last seen in our clinic in 2017.  She presented to the ED today via EMS from [facility] for tachycardia. Per triage report pt was weak yesterday and unable to stand, was also tachycardic but it went away on its own. Today pt was still weak and was tachycardic again prompting them to call EMS.   Additional information obtained from nursing staff at the facility - they report that pt seemed fatigued/"tired" all weekend. The patient has not been complaining of anything else to them - no excessive vomiting or diarrhea. No fevers. Unknown vaccination status, today Covid negative.  Pt has no complaints currently. She states she feels fine. She is alert to self and place. She knows that Barbette Or is the president however thinks it is 1970's. She denies any chest pain, shortness of breath, dizziness, lightheadedness, vision changes, nausea, vomiting.   The history is provided by the patient, medical records, the nursing home and the EMS personnel.   She was on aspirin and atenolol prior to admission, started on Cardizem drip in the ER, ECG shows atrial flutter with alternating 2:1 and 4:1 block.   Past Medical History:  Diagnosis Date  . Anxiety   . Arthritis   . Blood transfusion 1960's   During Nephrectomy Procedure  . Cancer [name]) 2001 and  2002   breast, right  . Cancer of colon ([place]) 08/03/12  . Chronic kidney disease    history of right nephrectomy due to stone '60's  . Complication of anesthesia    "I quit breathing" (patient reported it was due to PCN allergy though '60's)  . Coronary artery disease   . Depression   . GERD (gastroesophageal reflux disease)   . Heart murmur    s/p MV ring annuloplasty '03  . Hyperlipidemia   . Hypertension    dr Daneen Schick  . S/P radiation therapy 11/29/01 - 03/18/02   Right Breast: 5,040 cGy/28 Fractions with Boost for Total Dose  of 6,3000 cGy    Past Surgical History:  Procedure Laterality Date  . ABDOMINAL HYSTERECTOMY    . APPENDECTOMY    . APPENDECTOMY  08/03/2012   Procedure: APPENDECTOMY;  Surgeon: Madilyn Hook, DO;  Location: [place];  Service: General;  Laterality: N/A;  incidental appendectomy  . BREAST LUMPECTOMY    . BREAST SURGERY Right 2001  . catarects Bilateral few yrs ago  . COLON SURGERY  07-2012  . COLONOSCOPY WITH PROPOFOL N/A 08/08/2013   Procedure: COLONOSCOPY WITH PROPOFOL;  Surgeon: Garlan Fair, MD;  [facility];  Service: Endoscopy;  Laterality: N/A;  . CORONARY ARTERY BYPASS GRAFT  2003   CABG X 2 (SVGs to DIAG and OM)/MV Repair '03  . DIAGNOSTIC LAPAROSCOPY    . EYE SURGERY      lump removed rt eye  . kidney removed  1960's  . LAPAROTOMY  08/03/2012   Procedure: LAPAROTOMY for Left  Hemicolectomy   ;  Surgeon: Madilyn Hook, DO;  Location: [place];  Service: General;  Laterality: N/A;  Mini Laparotomy  . PROCTOSCOPY  08/03/2012   Procedure: PROCTOSCOPY;  Surgeon: Madilyn Hook, DO;  Location: [place];  Service: General;  Laterality: N/A;    Inpatient Medications: Scheduled Meds: . diltiazem  10 mg Intravenous Once   Continuous Infusions:  PRN Meds:   Allergies:    Allergies  Allergen Reactions  . Penicillins Anaphylaxis  . Levaquin [Levofloxacin] Other (See Comments)    PT TOLD TO LEAVE ANTIBIOTICS ALONE AFTER NEPHRECTOMY PT HAD  REACTION TO ANTIBIOTICS  . Niacin And Related Other (See Comments)    unknown  . Norvasc [Amlodipine Besylate] Other (See Comments)    Unknown, PT NOT SURE  . Novocain [Procaine Hcl]     Numbness would not leave   . Pravachol [Pravastatin Sodium] Other (See Comments)    Unknown, ? Questionable muscle aches  . Statins Other (See Comments)    Unknown, questionable muscle aches  . Sulfa Antibiotics   . Zetia [Ezetimibe] Other (See Comments)    Unknown, pt not sure    Social History:   Social History   Socioeconomic History  . Marital status: Married    Spouse name: Not on file  . Number of children: Not on file  . Years of education: Not on file  . Highest education level: Not on file  Occupational History  . Not on file  Tobacco Use  . Smoking status: Never Smoker  . Smokeless tobacco: Never Used  Substance and Sexual Activity  . Alcohol use: No  . Drug use: No  . Sexual activity: Never  Other Topics Concern  . Not on file  Social History Narrative   Married   Retired Optometrist         Social Determinants of Radio broadcast assistant Strain:   . Difficulty of Paying Living Expenses: Not on file  Food Insecurity:   . Worried About Charity fundraiser in the Last Year: Not on file  . Ran Out of Food in the Last Year: Not on file  Transportation Needs:   . Lack of Transportation (Medical): Not on file  . Lack of Transportation (Non-Medical): Not on file  Physical Activity:   . Days of Exercise per Week: Not on file  . Minutes of Exercise per Session: Not on file  Stress:   . Feeling of Stress : Not on file  Social Connections:   . Frequency of Communication with Friends and Family: Not on file  . Frequency of Social Gatherings with Friends and Family: Not on file  . Attends Religious Services: Not on file  . Active Member of Clubs or Organizations: Not on file  . Attends Archivist Meetings: Not on file  . Marital Status: Not on file  Intimate  Partner Violence:   . Fear of Current or Ex-Partner: Not on file  . Emotionally Abused: Not on file  . Physically Abused: Not on file  . Sexually Abused: Not on file    Family History:    Family History  Problem Relation Age of Onset  . Heart disease Mother   . Heart disease Father      ROS:  Please see the history of present illness.  All other ROS reviewed and negative.     Physical Exam/Data:   Vitals:   07/10/20  1745 07/10/20 1800 07/10/20 1815 07/10/20 1830  BP: (!) 109/96 109/83 (!) 120/96 (!) 122/97  Pulse: (!) 52 73 (!) 148   Resp: 20 (!) 21 (!) 22 10  Temp:      TempSrc:      SpO2: 95% 96% 97%     Intake/Output Summary (Last 24 hours) at 07/10/2020 1849 Last data filed at 07/10/2020 1424 Gross per 24 hour  Intake 500 ml  Output --  Net 500 ml   Last 3 Weights 10/21/2016 09/08/2016 08/26/2015  Weight (lbs) 109 lb 12.8 oz 109 lb 12.8 oz 120 lb 3.2 oz  Weight (kg) 49.805 kg 49.805 kg 54.522 kg     There is no height or weight on file to calculate BMI.  General:  Well nourished, well developed, in no acute distress, AAO x 2 HEENT: normal Lymph: no adenopathy Neck: no JVD Endocrine:  No thryomegaly Vascular: No carotid bruits; FA pulses 2+ bilaterally without bruits  Cardiac:  normal S1, S2; RRR; no murmur  Lungs:  clear to auscultation bilaterally, no wheezing, rhonchi or rales  Abd: soft, nontender, no hepatomegaly  Ext: no edema Musculoskeletal:  No deformities, BUE and BLE strength normal and equal Skin: warm and dry  Neuro:  CNs 2-12 intact, no focal abnormalities noted  EKG:  The EKG was personally reviewed and demonstrates:  ECG shows atrial flutter with alternating 2:1 and 4:1 block.  Previously SR with anterior inferior Telemetry:  Telemetry was personally reviewed and demonstrates:  Atrial flutter with RVR  Relevant CV Studies: Echo pending, no prior  Laboratory Data:  High Sensitivity Troponin:   Recent Labs  Lab 07/10/20 1234  07/10/20 1533  TROPONINIHS 17 27*     Chemistry Recent Labs  Lab 07/10/20 1234  NA 143  K 3.6  CL 102  CO2 28  GLUCOSE 103*  BUN 22  CREATININE 1.23*  CALCIUM 9.4  GFRNONAA 40*  GFRAA 47*  ANIONGAP 13    Recent Labs  Lab 07/10/20 1234  PROT 6.5  ALBUMIN 3.4*  AST 21  ALT 16  ALKPHOS 38  BILITOT 0.9   Hematology Recent Labs  Lab 07/10/20 1234  WBC 9.2  RBC 4.87  HGB 13.8  HCT 43.3  MCV 88.9  MCH 28.3  MCHC 31.9  RDW 14.3  PLT 191   BNPNo results for input(s): BNP, PROBNP in the last 168 hours.  DDimer No results for input(s): DDIMER in the last 168 hours.   Radiology/Studies:  DG Chest Port 1 View  Result Date: 07/10/2020 CLINICAL DATA:  Atrial flutter, history breast cancer, colon cancer EXAM: PORTABLE CHEST 1 VIEW COMPARISON:  Portable exam 9702 hours compared to 02/05/2014 FINDINGS: Upper normal size cardiac silhouette post CABG. Atherosclerotic calcification aorta. Mediastinal contours and pulmonary vascularity normal. Chronic peribronchial thickening and mild bibasilar atelectasis. No acute infiltrate, pleural effusion, or pneumothorax. Levoconvex thoracic scoliosis. IMPRESSION: Chronic bronchitic changes with bibasilar atelectasis. Electronically Signed   By: Lavonia Dana M.D.   On: 07/10/2020 12:50   Assessment and Plan:   1. New onset flutter with RVR CHADS-VASc score 5% (7.2% annual risk of stroke) The risk and fits of anticoagulation were discussed with the daughter, she has fallen once in the last year, I would advise she is walking well with a walker, she agrees that we can use anticoagulation short-term, at least for 4 weeks post cardioversion and then we will reevaluate patient functional status/balance and necessity for chronic anticoagulation if her arrhythmia recurs. -We will start  Cardizem drip and heparin drip -If she does not cardiovert spontaneously we will plan for TEE and cardioversion.  2.  CAD status post CABG in 2003, the patient is  asymptomatic, -We will hold aspirin for now as she is being started on heparin has history of colon cancer, her Hb today is 13.8, we will follow  3. Hypertension  - controlled, currently rather low, will hold home meds  Signed,  Ena Dawley, MD    07/10/2020 7:51 PM    For questions or updates, please contact [place] Please consult [email or web address] for contact info under   Signed, Ena Dawley, MD  07/10/2020 6:49 PM

## 2020-07-10 NOTE — ED Provider Notes (Signed)
[facility] EMERGENCY DEPARTMENT Provider Note   CSN: [id number] Arrival date & time: 07/10/20  1208     History Chief Complaint  Patient presents with  . Tachycardia   LEVEL 5 CAVEAT - DEMENTIA  Marie Burch is a 86 y.o. female with PMHx HTN, HLD, GERD, CAD s/p CABG, CKD who presents to the ED today via EMS from [facility] for tachycardia. Per triage report pt was weak yesterday and unable to stand, was also tachycardic but it went away on its own. Today pt was still weak and was tachycardic again prompting them to call EMS.   Additional information obtained from nursing staff at the facility - they report that pt seemed fatigued/"tired" all weekend. She had also been constipated and they have been giving her miralax. They went to check on patient today given generalized fatigued and noticed her heart rate. They do not believe her heart rate was elevated yesterday. Pt has not been complaining of anything else to them - no excessive vomiting or diarrhea. No fevers. Unknown vaccination status however they get tested every Wednesday at the facility.   Pt has no complaints currently. She states she feels fine. She is alert to self and place. She knows that Barbette Or is the president however thinks it is 1970's. She denies any chest pain, shortness of breath, dizziness, lightheadedness, vision changes, nausea, vomiting.   The history is provided by the patient, medical records, the nursing home and the EMS personnel.       Past Medical History:  Diagnosis Date  . Anxiety   . Arthritis   . Blood transfusion 1960's   During Nephrectomy Procedure  . Cancer [facility]) 2001 and 2002   breast, right  . Cancer of colon ([place]) 08/03/12  . Chronic kidney disease    history of right nephrectomy due to stone '60's  . Complication of anesthesia    "I quit breathing" (patient reported it was due to PCN allergy though '60's)  . Coronary artery disease   . Depression   . GERD  (gastroesophageal reflux disease)   . Heart murmur    s/p MV ring annuloplasty '03  . Hyperlipidemia   . Hypertension    dr Daneen Schick  . S/P radiation therapy 11/29/01 - 03/18/02   Right Breast: 5,040 cGy/28 Fractions with Boost for Total Dose  of 6,3000 cGy    Patient Active Problem List   Diagnosis Date Noted  . Atrial flutter, paroxysmal ([place]) 07/10/2020  . S/P mitral valve repair 06/12/2014  . Atherosclerosis of native coronary artery of native heart without angina pectoris 06/12/2014  . Essential hypertension 06/12/2014  . History of PSVT (paroxysmal supraventricular tachycardia) 06/12/2014  . Colon cancer ([place]) 08/29/2012    Past Surgical History:  Procedure Laterality Date  . ABDOMINAL HYSTERECTOMY    . APPENDECTOMY    . APPENDECTOMY  08/03/2012   Procedure: APPENDECTOMY;  Surgeon: Madilyn Hook, DO;  Location: [facility];  Service: General;  Laterality: N/A;  incidental appendectomy  . BREAST LUMPECTOMY    . BREAST SURGERY Right 2001  . catarects Bilateral few yrs ago  . COLON SURGERY  07-2012  . COLONOSCOPY WITH PROPOFOL N/A 08/08/2013   Procedure: COLONOSCOPY WITH PROPOFOL;  Surgeon: Garlan Fair, MD;  Location: [facility];  Service: Endoscopy;  Laterality: N/A;  . CORONARY ARTERY BYPASS GRAFT  2003   CABG X 2 (SVGs to DIAG and OM)/MV Repair '03  . DIAGNOSTIC LAPAROSCOPY    . EYE SURGERY  lump removed rt eye  . kidney removed  1960's  . LAPAROTOMY  08/03/2012   Procedure: LAPAROTOMY for Left  Hemicolectomy   ;  Surgeon: Madilyn Hook, DO;  Location: [facility];  Service: General;  Laterality: N/A;  Mini Laparotomy  . PROCTOSCOPY  08/03/2012   Procedure: PROCTOSCOPY;  Surgeon: Madilyn Hook, DO;  Location: [facility];  Service: General;  Laterality: N/A;     OB History   No obstetric history on file.     Family History  Problem Relation Age of Onset  . Heart disease Mother   . Heart disease Father     Social History   Tobacco Use  . Smoking status: Never Smoker    . Smokeless tobacco: Never Used  Substance Use Topics  . Alcohol use: No  . Drug use: No    Home Medications Prior to Admission medications   Medication Sig Start Date End Date Taking? Authorizing Provider  alendronate (FOSAMAX) 70 MG tablet Take 70 mg by mouth once a week. Take with a full glass of water on an empty stomach.   Yes [provider]  aspirin EC 81 MG tablet Take 81 mg by mouth daily.   Yes [provider]  atenolol (TENORMIN) 25 MG tablet Take 25 mg by mouth every morning.  04/22/12  Yes [provider]  Calcium Carb-Cholecalciferol (CALCIUM+D3) 600-800 MG-UNIT TABS Take 1 tablet by mouth daily.   Yes [provider]  cephALEXin (KEFLEX) 500 MG capsule Take 500 mg by mouth 4 (four) times daily.   Yes [provider]  clobetasol ointment (TEMOVATE) 5.42 % Apply 1 application topically 2 (two) times daily.   Yes [provider]  clonazePAM (KLONOPIN) 0.5 MG tablet Take 0.5 mg by mouth at bedtime.    Yes [provider]  hydrochlorothiazide (HYDRODIURIL) 25 MG tablet Take 25 mg by mouth daily.   Yes [provider]  Naproxen Sodium (ALEVE) 220 MG CAPS Take 220 mg by mouth daily as needed (for pain).    Yes [provider]  NITROSTAT 0.4 MG SL tablet DISSOLVE 1 TABLET UNDER THE TONGUE EVERY5 MINUTES AS NEEDED UP TO 3 DOSES 02/18/17  Yes Belva Crome, MD  Omega-3 Fatty Acids (FISH OIL) 1000 MG CAPS Take 2,000 mg by mouth daily.    Yes [provider]  polyethylene glycol (MIRALAX / GLYCOLAX) packet Take 17 g by mouth daily as needed (for constipation).   Yes [provider]  potassium chloride SA (KLOR-CON) 20 MEQ tablet Take 20 mEq by mouth daily.   Yes [provider]  sertraline (ZOLOFT) 100 MG tablet Take 100 mg by mouth daily.   Yes [provider]  traMADol (ULTRAM) 50 MG tablet Take 50 mg by mouth every 6 (six) hours as needed for pain.    Yes [provider]  HYDROcodone-acetaminophen (NORCO/VICODIN) 5-325 MG per tablet Take 1 tablet by mouth every 4 (four) hours as needed (pain). Patient not taking: Reported on 07/10/2020 08/08/12   Madilyn Hook, DO    Allergies    Penicillins, Levaquin [levofloxacin], Niacin and related, Norvasc [amlodipine besylate], Novocain [procaine hcl], Pravachol [pravastatin sodium], Statins, Sulfa antibiotics, and Zetia [ezetimibe]  Review of Systems   Review of Systems  Unable to perform ROS: Dementia  Constitutional: Positive for fatigue.    Physical Exam Updated Vital Signs BP (!) 125/93 (BP Location: Left Arm)   Pulse (!) 151   Temp 98.3 F (36.8 C) (Oral)   Resp Marland Kitchen)  26   SpO2 94%   Physical Exam Vitals and nursing note reviewed.  Constitutional:      Appearance: She is not ill-appearing or diaphoretic.  HENT:     Head: Normocephalic and atraumatic.  Eyes:     Extraocular Movements: Extraocular movements intact.     Conjunctiva/sclera: Conjunctivae normal.     Pupils: Pupils are equal, round, and reactive to light.  Cardiovascular:     Rate and Rhythm: Tachycardia present. Rhythm irregular.  Pulmonary:     Effort: Pulmonary effort is normal.     Breath sounds: Normal breath sounds. No wheezing, rhonchi or rales.  Abdominal:     Palpations: Abdomen is soft.     Tenderness: There is no abdominal tenderness. There is no guarding or rebound.  Musculoskeletal:     Cervical back: Neck supple.  Skin:    General: Skin is warm and dry.  Neurological:     General: No focal deficit present.     Mental Status: She is alert.     Cranial Nerves: No cranial nerve deficit.     Motor: No weakness.     ED Results / Procedures / Treatments   Labs (all labs ordered are listed, but only abnormal results are displayed) Labs Reviewed  COMPREHENSIVE METABOLIC PANEL - Abnormal; Notable for the following components:      Result Value   Glucose, Bld 103 (*)    Creatinine, Ser 1.23 (*)     Albumin 3.4 (*)    GFR calc non Af Amer 40 (*)    GFR calc Af Amer 47 (*)    All other components within normal limits  CBC WITH DIFFERENTIAL/PLATELET - Abnormal; Notable for the following components:   Abs Immature Granulocytes 0.08 (*)    All other components within normal limits  URINALYSIS, ROUTINE W REFLEX MICROSCOPIC - Abnormal; Notable for the following components:   APPearance HAZY (*)    Protein, ur 30 (*)    Leukocytes,Ua LARGE (*)    Bacteria, UA RARE (*)    All other components within normal limits  TROPONIN I (HIGH SENSITIVITY) - Abnormal; Notable for the following components:   Troponin I (High Sensitivity) 27 (*)    All other components within normal limits  SARS CORONAVIRUS 2 BY RT PCR (HOSPITAL ORDER, [facility])  URINE CULTURE  MAGNESIUM  TROPONIN I (HIGH SENSITIVITY)    EKG EKG Interpretation  Date/Time:  Wednesday July 10 2020 14:23:20 EDT Ventricular Rate:  75 PR Interval:    QRS Duration: 78 QT Interval:  395 QTC Calculation: 442 R Axis:   18 Text Interpretation: Atrial flutter with predominant 4:1 AV block Probable inferior infarct, recent Anteroseptal infarct, age indeterminate RVR no longer presents Confirmed by Sherwood Gambler [phone]) on 07/10/2020 2:52:16 PM   Radiology DG Chest Port 1 View  Result Date: 07/10/2020 CLINICAL DATA:  Atrial flutter, history breast cancer, colon cancer EXAM: PORTABLE CHEST 1 VIEW COMPARISON:  Portable exam 1243 hours compared to 02/05/2014 FINDINGS: Upper normal size cardiac silhouette post CABG. Atherosclerotic calcification aorta. Mediastinal contours and pulmonary vascularity normal. Chronic peribronchial thickening and mild bibasilar atelectasis. No acute infiltrate, pleural effusion, or pneumothorax. Levoconvex thoracic scoliosis. IMPRESSION: Chronic bronchitic changes with bibasilar atelectasis. Electronically Signed   By: Lavonia Dana M.D.   On: 07/10/2020 12:50     Procedures Procedures (including critical care time)  Medications Ordered in ED Medications  diltiazem (CARDIZEM) injection 10 mg (0 mg Intravenous Hold 07/10/20 1735)  heparin bolus via infusion 3,000 Units (has no administration in time range)  heparin ADULT infusion 100 units/mL (25000 units/248mL sodium chloride 0.45%) (has no administration in time range)  sodium chloride 0.9 % bolus 500 mL (0 mLs Intravenous Stopped 07/10/20 1424)  diltiazem (CARDIZEM) injection 10 mg (10 mg Intravenous Given 07/10/20 1307)    ED Course  I have reviewed the triage vital signs and the nursing notes.  Pertinent labs & imaging results that were available during my care of the patient were reviewed by me and considered in my medical decision making (see chart for details).  Clinical Course as of Jul 10 2028  Wed Jul 10, 2020  1323 HR decreased to 99 after diltiazem   [MV]    Clinical Course User Index [MV] Eustaquio Maize, PA-C   MDM Rules/Calculators/A&P     CHA2DS2-VASc Score: 67                     86 year old female from [facility] facility presents to the ED today for tachycardia.  Found to be in a flutter today and brought to the ED.  On arrival to the ED patient is afebrile, nontachypneic.  Patient in no acute distress.  Heart rate is in the 140s 150s.  Difficult to discern whether patient is in SVT versus a flutter on EKG however suspect a flutter at this time given presentation. It appears pt has been fatigued over the weekend and it has been > 48 hours; do not think she is a candidate for cardioversion at this time. Will plan for basic lab work including CBC, CMP, mag, troponin, CXR. Will provide 500 CC fluid bolus and start with 10 mg cardizem IV. Pt does have an allergy noted to amlodipine - have discussed with ED Pharmacy who does not think this should be an issue. Attending physician Dr. Regenia Skeeter has evaluated patient as well and agrees with plan.   HR decreased to the 90s  after 10 mg cardizem. Will continue to monitor.   CXR clear CBC without leukocytosis. Hgb stable at 13.8 CMP with creatinine 1.23; appears to be near pt's baseline. She has gotten 500 CC fluid bolus  Lab Results  Component Value Date   CREATININE 1.23 (H) 07/10/2020   CREATININE 1.10 (H) 10/30/2016   CREATININE 1.0 02/16/2013   Troponin of 17; will plan to repeat Mag 1.9 COVID negative  Repeat troponin 27  HR increased to the 140s again; another EKG obtained which showed similar a flutter. Will order another 10 mg cardizem and consult cardiology for further recommendations. It does appear that 1-2 hours after the first dose of cardizem pt's HR went into the 40s. She is on atenolol regularly and got her dose this morning per the [facility] that was sent from the facility.   Have discussed case with cardiology - they will come evaluate patient.   Cardiology to admit.   This note was prepared using Dragon voice recognition software and may include unintentional dictation errors due to the inherent limitations of voice recognition software.  Final Clinical Impression(s) / ED Diagnoses Final diagnoses:  Atrial flutter with rapid ventricular response [facility])    Rx / DC Orders ED Discharge Orders    None       Eustaquio Maize, PA-C 07/10/20 2029    Sherwood Gambler, MD 07/11/20 [phone]

## 2020-07-10 NOTE — Progress Notes (Signed)
   07/10/20 2225  Assess: MEWS Score  Temp 98.1 F (36.7 C)  BP 111/78  Pulse Rate (!) 149  Resp 20  Level of Consciousness Alert  SpO2 98 %  O2 Device Nasal Cannula  O2 Flow Rate (L/min) 2 L/min  Assess: MEWS Score  MEWS Temp 0  MEWS Systolic 0  MEWS Pulse 3  MEWS RR 0  MEWS LOC 0  MEWS Score 3  MEWS Score Color Yellow  Assess: if the MEWS score is Yellow or Red  Were vital signs taken at a resting state? Yes  Early Detection of Sepsis Score *See Row Information* Medium  MEWS guidelines implemented *See Row Information* Yes  Treat  MEWS Interventions Escalated (See documentation below)  Pain Scale 0-10  Pain Score 0  Take Vital Signs  Increase Vital Sign Frequency  Yellow: Q 2hr X 2 then Q 4hr X 2, if remains yellow, continue Q 4hrs  Escalate  MEWS: Escalate Yellow: discuss with charge nurse/RN and consider discussing with provider and RRT  Notify: Charge Nurse/RN  Name of Charge Nurse/RN Notified [place], RN  Date Charge Nurse/RN Notified 07/10/20  Time Charge Nurse/RN Notified 2225  Notify: Provider  Provider Name/Title Johney Frame MD  Date Provider Notified 07/10/20  Time Provider Notified 2240  Notification Type Call  Notification Reason Other (Comment) (per protocol)  Response No new orders  Date of Provider Response 07/10/20  Time of Provider Response 2242  Document  Patient Outcome Stabilized after interventions  Progress note created (see row info) Yes

## 2020-07-10 NOTE — ED Triage Notes (Signed)
Pt here from [facility] via [facility] for weakness and tachycardia. Per facility, pt was weak yesterday and unable to stand, was also tachycardic but it went away on its own, today pt is still weak but can stand, and is tachy in 140s. Per Ems pt appears to be in a-flutter. Hx of dementia, is currently at baseline. VSS, alert to self

## 2020-07-10 NOTE — Progress Notes (Signed)
ANTICOAGULATION CONSULT NOTE - Initial Consult  Pharmacy Consult for heparin Indication: atrial fibrillation  Allergies  Allergen Reactions  . Penicillins Anaphylaxis  . Levaquin [Levofloxacin] Other (See Comments)    PT TOLD TO LEAVE ANTIBIOTICS ALONE AFTER NEPHRECTOMY PT HAD REACTION TO ANTIBIOTICS  . Niacin And Related Other (See Comments)    unknown  . Norvasc [Amlodipine Besylate] Other (See Comments)    Unknown, PT NOT SURE  . Novocain [Procaine Hcl]     Numbness would not leave   . Pravachol [Pravastatin Sodium] Other (See Comments)    Unknown, ? Questionable muscle aches  . Statins Other (See Comments)    Unknown, questionable muscle aches  . Sulfa Antibiotics   . Zetia [Ezetimibe] Other (See Comments)    Unknown, pt not sure    Patient Measurements: Height: 5\' 2"  (157.5 cm) Weight: 55.4 kg (122 lb 2.2 oz) IBW/kg (Calculated) : 50.1 Heparin Dosing Weight: 55.4kg  Vital Signs: Temp: 98.3 F (36.8 C) (09/01 1219) Temp Source: Oral (09/01 1219) BP: 122/97 (09/01 1830) Pulse Rate: 148 (09/01 1815)  Labs: Recent Labs    07/10/20 1234 07/10/20 1533  HGB 13.8  --   HCT 43.3  --   PLT 191  --   CREATININE 1.23*  --   TROPONINIHS 17 27*    Estimated Creatinine Clearance: 26.9 mL/min (A) (by C-G formula based on SCr of 1.23 mg/dL (H)).   Medical History: Past Medical History:  Diagnosis Date  . Anxiety   . Arthritis   . Blood transfusion 1960's   During Nephrectomy Procedure  . Cancer [facility]) 2001 and 2002   breast, right  . Cancer of colon ([place]) 08/03/12  . Chronic kidney disease    history of right nephrectomy due to stone '60's  . Complication of anesthesia    "I quit breathing" (patient reported it was due to PCN allergy though '60's)  . Coronary artery disease   . Depression   . GERD (gastroesophageal reflux disease)   . Heart murmur    s/p MV ring annuloplasty '03  . Hyperlipidemia   . Hypertension    dr Daneen Schick  . S/P radiation therapy  11/29/01 - 03/18/02   Right Breast: 5,040 cGy/28 Fractions with Boost for Total Dose  of 6,3000 cGy    Medications:  Infusions:  . heparin      Assessment: 86yoF starting on heparin drip for atrial flutter with RVR. Hgb and PLT WNL, no signs of bleeding.   Goal of Therapy:  Heparin level 0.3-0.7 units/ml Monitor platelets by anticoagulation protocol: Yes   Plan:  Give 3,000 units bolus x 1 Start heparin infusion at 800 units/hr Check anti-Xa level in 8 hours (>70y/o) and daily while on heparin Continue to monitor H&H and platelets  Follow plans for TEE for cardioversion and oral anticoagulation  Mercy Riding, PharmD PGY1 Acute Care Pharmacy Resident Please refer to [facility] for unit-specific pharmacist

## 2020-07-10 NOTE — Consult Note (Deleted)
Entered in error

## 2020-07-10 NOTE — ED Notes (Signed)
Attempted report 

## 2020-07-11 ENCOUNTER — Observation Stay (HOSPITAL_COMMUNITY): Payer: PPO

## 2020-07-11 DIAGNOSIS — I351 Nonrheumatic aortic (valve) insufficiency: Secondary | ICD-10-CM

## 2020-07-11 DIAGNOSIS — I10 Essential (primary) hypertension: Secondary | ICD-10-CM | POA: Diagnosis not present

## 2020-07-11 DIAGNOSIS — Z85038 Personal history of other malignant neoplasm of large intestine: Secondary | ICD-10-CM | POA: Diagnosis not present

## 2020-07-11 DIAGNOSIS — I2510 Atherosclerotic heart disease of native coronary artery without angina pectoris: Secondary | ICD-10-CM | POA: Diagnosis present

## 2020-07-11 DIAGNOSIS — I340 Nonrheumatic mitral (valve) insufficiency: Secondary | ICD-10-CM

## 2020-07-11 DIAGNOSIS — Z8249 Family history of ischemic heart disease and other diseases of the circulatory system: Secondary | ICD-10-CM | POA: Diagnosis not present

## 2020-07-11 DIAGNOSIS — L8990 Pressure ulcer of unspecified site, unspecified stage: Secondary | ICD-10-CM | POA: Diagnosis present

## 2020-07-11 DIAGNOSIS — N1832 Chronic kidney disease, stage 3b: Secondary | ICD-10-CM | POA: Diagnosis present

## 2020-07-11 DIAGNOSIS — I361 Nonrheumatic tricuspid (valve) insufficiency: Secondary | ICD-10-CM

## 2020-07-11 DIAGNOSIS — F0390 Unspecified dementia without behavioral disturbance: Secondary | ICD-10-CM | POA: Diagnosis present

## 2020-07-11 DIAGNOSIS — I129 Hypertensive chronic kidney disease with stage 1 through stage 4 chronic kidney disease, or unspecified chronic kidney disease: Secondary | ICD-10-CM | POA: Diagnosis present

## 2020-07-11 DIAGNOSIS — N390 Urinary tract infection, site not specified: Secondary | ICD-10-CM | POA: Diagnosis present

## 2020-07-11 DIAGNOSIS — I4892 Unspecified atrial flutter: Secondary | ICD-10-CM | POA: Diagnosis present

## 2020-07-11 DIAGNOSIS — Z905 Acquired absence of kidney: Secondary | ICD-10-CM | POA: Diagnosis not present

## 2020-07-11 DIAGNOSIS — E7800 Pure hypercholesterolemia, unspecified: Secondary | ICD-10-CM | POA: Diagnosis not present

## 2020-07-11 DIAGNOSIS — Z853 Personal history of malignant neoplasm of breast: Secondary | ICD-10-CM | POA: Diagnosis not present

## 2020-07-11 DIAGNOSIS — Z20822 Contact with and (suspected) exposure to covid-19: Secondary | ICD-10-CM | POA: Diagnosis present

## 2020-07-11 DIAGNOSIS — K5900 Constipation, unspecified: Secondary | ICD-10-CM | POA: Diagnosis present

## 2020-07-11 DIAGNOSIS — E785 Hyperlipidemia, unspecified: Secondary | ICD-10-CM | POA: Diagnosis present

## 2020-07-11 DIAGNOSIS — Z951 Presence of aortocoronary bypass graft: Secondary | ICD-10-CM | POA: Diagnosis not present

## 2020-07-11 DIAGNOSIS — Z923 Personal history of irradiation: Secondary | ICD-10-CM | POA: Diagnosis not present

## 2020-07-11 LAB — ECHOCARDIOGRAM COMPLETE
Area-P 1/2: 2.2 cm2
Height: 62 in
P 1/2 time: 451 msec
S' Lateral: 3.14 cm
Weight: 1992.96 oz

## 2020-07-11 LAB — MRSA PCR SCREENING: MRSA by PCR: NEGATIVE

## 2020-07-11 LAB — HEPARIN LEVEL (UNFRACTIONATED): Heparin Unfractionated: 0.42 IU/mL (ref 0.30–0.70)

## 2020-07-11 LAB — URINE CULTURE

## 2020-07-11 MED ORDER — DILTIAZEM HCL 60 MG PO TABS
30.0000 mg | ORAL_TABLET | Freq: Two times a day (BID) | ORAL | Status: DC
  Administered 2020-07-11 – 2020-07-12 (×3): 30 mg via ORAL
  Filled 2020-07-11 (×3): qty 1

## 2020-07-11 NOTE — Progress Notes (Signed)
Progress Note  Patient Name: Marie Burch Date of Encounter: 07/11/2020  [facility] Cardiologist: New  Subjective   Admitted last night for aflutter started on IV cardizem and IV heparin. Patient converted to sinus overnight. She denies chest pain or palpitations. Feel breathing is stable.  Inpatient Medications    Scheduled Meds: . aspirin EC  81 mg Oral Daily  . atenolol  25 mg Oral q morning - 10a  . diltiazem  10 mg Intravenous Once   Continuous Infusions: . diltiazem (CARDIZEM) infusion 5 mg/hr (07/10/20 2316)  . heparin 800 Units/hr (07/10/20 2055)   PRN Meds: acetaminophen, ondansetron (ZOFRAN) IV   Vital Signs    Vitals:   07/11/20 0052 07/11/20 0152 07/11/20 0414 07/11/20 0552  BP: (!) 99/59 (!) 107/49 103/62 107/75  Pulse: 92 92 (!) 105 76  Resp: 20 18 18 19   Temp: (!) 97.5 F (36.4 C) (!) 97.5 F (36.4 C) 97.7 F (36.5 C) 97.8 F (36.6 C)  TempSrc: Oral Oral Oral Oral  SpO2: 95% 95% 95% 94%  Weight:      Height:        Intake/Output Summary (Last 24 hours) at 07/11/2020 0814 Last data filed at 07/11/2020 0600 Gross per 24 hour  Intake 726.34 ml  Output 450 ml  Net 276.34 ml   Last 3 Weights 07/10/2020 07/10/2020 10/21/2016  Weight (lbs) 124 lb 9 oz 122 lb 2.2 oz 109 lb 12.8 oz  Weight (kg) 56.5 kg 55.4 kg 49.805 kg      Telemetry    Aflutter>NSR/sinus arrhyhtmia, HR 70s, PVcs - Personally Reviewed  ECG    SVT vs aflutter 148bpm, q waves ant leads - Personally Reviewed  Physical Exam   GEN: No acute distress.   Neck: No JVD Cardiac: RRR, no murmurs, rubs, or gallops.  Respiratory: Clear to auscultation bilaterally. GI: Soft, nontender, non-distended  MS: No edema; No deformity. Neuro:  Nonfocal  Psych: Normal affect   Labs    High Sensitivity Troponin:   Recent Labs  Lab 07/10/20 1234 07/10/20 1533  TROPONINIHS 17 27*      Chemistry Recent Labs  Lab 07/10/20 1234  NA 143  K 3.6  CL 102  CO2 28  GLUCOSE 103*  BUN 22    CREATININE 1.23*  CALCIUM 9.4  PROT 6.5  ALBUMIN 3.4*  AST 21  ALT 16  ALKPHOS 38  BILITOT 0.9  GFRNONAA 40*  GFRAA 47*  ANIONGAP 13     Hematology Recent Labs  Lab 07/10/20 1234  WBC 9.2  RBC 4.87  HGB 13.8  HCT 43.3  MCV 88.9  MCH 28.3  MCHC 31.9  RDW 14.3  PLT 191    BNPNo results for input(s): BNP, PROBNP in the last 168 hours.   DDimer No results for input(s): DDIMER in the last 168 hours.   Radiology    DG Chest Port 1 View  Result Date: 07/10/2020 CLINICAL DATA:  Atrial flutter, history breast cancer, colon cancer EXAM: PORTABLE CHEST 1 VIEW COMPARISON:  Portable exam 3354 hours compared to 02/05/2014 FINDINGS: Upper normal size cardiac silhouette post CABG. Atherosclerotic calcification aorta. Mediastinal contours and pulmonary vascularity normal. Chronic peribronchial thickening and mild bibasilar atelectasis. No acute infiltrate, pleural effusion, or pneumothorax. Levoconvex thoracic scoliosis. IMPRESSION: Chronic bronchitic changes with bibasilar atelectasis. Electronically Signed   By: Lavonia Dana M.D.   On: 07/10/2020 12:50    Cardiac Studies   Echo 07/10/20  1. Prominant epicardial fat pad.  2.  Left ventricular ejection fraction, by estimation, is 60 to 65%. The  left ventricle has normal function. The left ventricle has no regional  wall motion abnormalities. Left ventricular diastolic parameters are  indeterminate.  3. Right ventricular systolic function is normal. The right ventricular  size is normal. There is normal pulmonary artery systolic pressure.  4. Left atrial size was mildly dilated.  5. The mitral valve is degenerative. Mild mitral valve regurgitation. No  evidence of mitral stenosis.  6. The aortic valve is tricuspid. Aortic valve regurgitation is mild.  Mild to moderate aortic valve sclerosis/calcification is present, without  any evidence of aortic stenosis.  7. The inferior vena cava is normal in size with greater than 50%   respiratory variability, suggesting right atrial pressure of 3 mmHg.  Patient Profile     86 y.o. female with pmh of CAD s/p CABG, PSVT, HTN, HLD, MVR 2003 with CABG, CKD, dementia, in a nursing home who is being seen for aflutter RVR.  Assessment & Plan    New onset Aflutter RVR - CHADSVASC 5 - Patient converted to sinus overnight, rates in the 70s - Anticoagulation discussed with daughter given patient's instability. Daughter had agreed 4 weeks a/c post-cardioversion however now that she is in sinus will have to revisit.  - IV heparin for now - IV cardizem for rate control>>can consolidate or restart home atenolol - Echo showed LVEF 60-65%, no WMA, mildly dilated LA, degenerative MV, mild MR, mild AI - will discuss a/c with MD  CAD s/p CABG 2003 - asymptomatic - Aspirin held for IV heparin  HTN - home meds held for soft pressures - pta HCTZ and atenolol  For questions or updates, please contact [facility] Please consult [email or web address] for contact info under        Signed, Marcee Jacobs Ninfa Meeker, PA-C  07/11/2020, 8:14 AM

## 2020-07-11 NOTE — Progress Notes (Signed)
Received call from central telemetry patient mcl lead showing ST elevation 3.Patient current rhythm atrial flutter.Dr. Johney Frame notified no new orders received.

## 2020-07-11 NOTE — Progress Notes (Signed)
[place] for heparin Indication: atrial fibrillation  Allergies  Allergen Reactions  . Penicillins Anaphylaxis  . Levaquin [Levofloxacin] Other (See Comments)    PT TOLD TO LEAVE ANTIBIOTICS ALONE AFTER NEPHRECTOMY PT HAD REACTION TO ANTIBIOTICS  . Niacin And Related Other (See Comments)    unknown  . Norvasc [Amlodipine Besylate] Other (See Comments)    Unknown, PT NOT SURE  . Novocain [Procaine Hcl]     Numbness would not leave   . Pravachol [Pravastatin Sodium] Other (See Comments)    Unknown, ? Questionable muscle aches  . Statins Other (See Comments)    Unknown, questionable muscle aches  . Sulfa Antibiotics   . Zetia [Ezetimibe] Other (See Comments)    Unknown, pt not sure    Patient Measurements: Height: 5\' 2"  (157.5 cm) Weight: 56.5 kg (124 lb 9 oz) IBW/kg (Calculated) : 50.1 Heparin Dosing Weight: 55.4kg  Vital Signs: Temp: 97.7 F (36.5 C) (09/02 0414) Temp Source: Oral (09/02 0414) BP: 103/62 (09/02 0414) Pulse Rate: 105 (09/02 0414)  Labs: Recent Labs    07/10/20 1234 07/10/20 1533 07/11/20 0401  HGB 13.8  --   --   HCT 43.3  --   --   PLT 191  --   --   HEPARINUNFRC  --   --  0.42  CREATININE 1.23*  --   --   TROPONINIHS 17 27*  --     Estimated Creatinine Clearance: 26.9 mL/min (A) (by C-G formula based on SCr of 1.23 mg/dL (H)).   Medical History: Past Medical History:  Diagnosis Date  . Anxiety   . Arthritis   . Blood transfusion 1960's   During Nephrectomy Procedure  . Cancer [facility]) 2001 and 2002   breast, right  . Cancer of colon ([place]) 08/03/12  . Chronic kidney disease    history of right nephrectomy due to stone '60's  . Complication of anesthesia    "I quit breathing" (patient reported it was due to PCN allergy though '60's)  . Coronary artery disease   . Depression   . GERD (gastroesophageal reflux disease)   . Heart murmur    s/p MV ring annuloplasty '03  . Hyperlipidemia   .  Hypertension    dr Daneen Schick  . S/P radiation therapy 11/29/01 - 03/18/02   Right Breast: 5,040 cGy/28 Fractions with Boost for Total Dose  of 6,3000 cGy    Medications:  Infusions:  . diltiazem (CARDIZEM) infusion 5 mg/hr (07/10/20 2316)  . heparin 800 Units/hr (07/10/20 2055)    Assessment: 86yoF starting on heparin drip for atrial flutter with RVR. Hgb and PLT WNL, no signs of bleeding.   Heparin level this AM is within goal range at 0.42.  No overt bleeding or complications noted.  Goal of Therapy:  Heparin level 0.3-0.7 units/ml Monitor platelets by anticoagulation protocol: Yes   Plan:  Continue IV heparin at current rate. Check anti-Xa level in 8 hours (>70y/o) and daily while on heparin Continue to monitor H&H and platelets  Follow plans for TEE for cardioversion and oral anticoagulation  [place] Crane, Vena Austria, BCPS, [facility] Clinical Pharmacist  07/11/2020 5:02 AM   [facility] pharmacy phone numbers are listed on [email or web address]

## 2020-07-11 NOTE — Progress Notes (Signed)
  Echocardiogram 2D Echocardiogram has been performed.  Marie Burch 07/11/2020, 9:10 AM

## 2020-07-12 ENCOUNTER — Other Ambulatory Visit: Payer: Self-pay | Admitting: Medical

## 2020-07-12 ENCOUNTER — Telehealth: Payer: Self-pay | Admitting: Interventional Cardiology

## 2020-07-12 ENCOUNTER — Other Ambulatory Visit: Payer: Self-pay

## 2020-07-12 ENCOUNTER — Telehealth: Payer: Self-pay | Admitting: *Deleted

## 2020-07-12 DIAGNOSIS — F0391 Unspecified dementia with behavioral disturbance: Secondary | ICD-10-CM

## 2020-07-12 DIAGNOSIS — L8990 Pressure ulcer of unspecified site, unspecified stage: Secondary | ICD-10-CM | POA: Insufficient documentation

## 2020-07-12 DIAGNOSIS — Z951 Presence of aortocoronary bypass graft: Secondary | ICD-10-CM

## 2020-07-12 DIAGNOSIS — F03918 Unspecified dementia, unspecified severity, with other behavioral disturbance: Secondary | ICD-10-CM

## 2020-07-12 DIAGNOSIS — E7800 Pure hypercholesterolemia, unspecified: Secondary | ICD-10-CM

## 2020-07-12 DIAGNOSIS — E785 Hyperlipidemia, unspecified: Secondary | ICD-10-CM

## 2020-07-12 DIAGNOSIS — N390 Urinary tract infection, site not specified: Secondary | ICD-10-CM

## 2020-07-12 LAB — URINALYSIS, ROUTINE W REFLEX MICROSCOPIC
Bacteria, UA: NONE SEEN
Bilirubin Urine: NEGATIVE
Glucose, UA: NEGATIVE mg/dL
Hgb urine dipstick: NEGATIVE
Ketones, ur: NEGATIVE mg/dL
Nitrite: NEGATIVE
Protein, ur: NEGATIVE mg/dL
Specific Gravity, Urine: 1.01 (ref 1.005–1.030)
pH: 7 (ref 5.0–8.0)

## 2020-07-12 LAB — LIPID PANEL
Cholesterol: 199 mg/dL (ref 0–200)
HDL: 33 mg/dL — ABNORMAL LOW (ref >40)
LDL Cholesterol: 130 mg/dL — ABNORMAL HIGH (ref 0–99)
Total CHOL/HDL Ratio: 6 RATIO
Triglycerides: 181 mg/dL — ABNORMAL HIGH (ref <150)
VLDL: 36 mg/dL (ref 0–40)

## 2020-07-12 LAB — CBC
HCT: 39.5 % (ref 36.0–46.0)
Hemoglobin: 12.6 g/dL (ref 12.0–15.0)
MCH: 27.9 pg (ref 26.0–34.0)
MCHC: 31.9 g/dL (ref 30.0–36.0)
MCV: 87.4 fL (ref 80.0–100.0)
Platelets: 180 10*3/uL (ref 150–400)
RBC: 4.52 MIL/uL (ref 3.87–5.11)
RDW: 14.2 % (ref 11.5–15.5)
WBC: 6.7 10*3/uL (ref 4.0–10.5)
nRBC: 0 % (ref 0.0–0.2)

## 2020-07-12 LAB — TSH: TSH: 1.107 u[IU]/mL (ref 0.350–4.500)

## 2020-07-12 LAB — SARS CORONAVIRUS 2 BY RT PCR (HOSPITAL ORDER, PERFORMED IN ~~LOC~~ HOSPITAL LAB): SARS Coronavirus 2: NEGATIVE

## 2020-07-12 MED ORDER — CEPHALEXIN 500 MG PO CAPS: 500.0000 mg | ORAL_CAPSULE | Freq: Four times a day (QID) | ORAL | 0 refills | Status: DC

## 2020-07-12 MED ORDER — APIXABAN 2.5 MG PO TABS
2.5000 mg | ORAL_TABLET | Freq: Two times a day (BID) | ORAL | Status: DC
  Administered 2020-07-12: 2.5 mg via ORAL
  Filled 2020-07-12: qty 1

## 2020-07-12 MED ORDER — DILTIAZEM HCL 30 MG PO TABS: 30.0000 mg | ORAL_TABLET | Freq: Two times a day (BID) | ORAL | 6 refills | Status: DC

## 2020-07-12 MED ORDER — APIXABAN 2.5 MG PO TABS: 2.5000 mg | ORAL_TABLET | Freq: Two times a day (BID) | ORAL | 2 refills | Status: DC

## 2020-07-12 NOTE — Telephone Encounter (Signed)
  TOC appointment with on 07/24/20 @ 8:45 am with Richardson Dopp per Cadence Kathlen Mody

## 2020-07-12 NOTE — Progress Notes (Signed)
PT Cancellation Note  Patient Details Name: Marie Burch MRN: [id number] DOB: 1934/10/24   Cancelled Treatment:    Reason Eval/Treat Not Completed: PT screened, no needs identified, will sign off Pt to return to facility today and does not need acute PT. Will sign off. If needs change, please reconsult.  Reuel Derby, PT, DPT  Acute Rehabilitation Services  Pager: [phone] Office: [phone]  [name] 07/12/2020, 2:36 PM

## 2020-07-12 NOTE — Telephone Encounter (Signed)
Pt's daughter [name] has POA and notified re pt's appt with Richardson Dopp [facility] on 07/24/20 to come fasting and will do labs after appt Daughter verbalized understanding./cy

## 2020-07-12 NOTE — Discharge Instructions (Signed)

## 2020-07-12 NOTE — Progress Notes (Signed)
[place] for heparin Indication: atrial fibrillation  Allergies  Allergen Reactions  . Penicillins Anaphylaxis  . Levaquin [Levofloxacin] Other (See Comments)    PT TOLD TO LEAVE ANTIBIOTICS ALONE AFTER NEPHRECTOMY PT HAD REACTION TO ANTIBIOTICS  . Niacin And Related Other (See Comments)    unknown  . Norvasc [Amlodipine Besylate] Other (See Comments)    Unknown, PT NOT SURE  . Novocain [Procaine Hcl]     Numbness would not leave   . Pravachol [Pravastatin Sodium] Other (See Comments)    Unknown, ? Questionable muscle aches  . Statins Other (See Comments)    Unknown, questionable muscle aches  . Sulfa Antibiotics   . Zetia [Ezetimibe] Other (See Comments)    Unknown, pt not sure    Patient Measurements: Height: 5\' 2"  (157.5 cm) Weight: 55.3 kg (121 lb 14.6 oz) IBW/kg (Calculated) : 50.1 Heparin Dosing Weight: 55.4kg  Vital Signs: Temp: 98 F (36.7 C) (09/03 0806) Temp Source: Oral (09/03 0806) BP: 126/83 (09/03 0806) Pulse Rate: 70 (09/03 0806)  Labs: Recent Labs    07/10/20 1234 07/10/20 1533 07/11/20 0401  HGB 13.8  --   --   HCT 43.3  --   --   PLT 191  --   --   HEPARINUNFRC  --   --  0.42  CREATININE 1.23*  --   --   TROPONINIHS 17 27*  --     Estimated Creatinine Clearance: 26.9 mL/min (A) (by C-G formula based on SCr of 1.23 mg/dL (H)).   Medical History: Past Medical History:  Diagnosis Date  . Anxiety   . Arthritis   . Blood transfusion 1960's   During Nephrectomy Procedure  . Cancer [facility]) 2001 and 2002   breast, right  . Cancer of colon ([place]) 08/03/12  . Chronic kidney disease    history of right nephrectomy due to stone '60's  . Complication of anesthesia    "I quit breathing" (patient reported it was due to PCN allergy though '60's)  . Coronary artery disease   . Depression   . GERD (gastroesophageal reflux disease)   . Heart murmur    s/p MV ring annuloplasty '03  . Hyperlipidemia   .  Hypertension    dr Daneen Schick  . S/P radiation therapy 11/29/01 - 03/18/02   Right Breast: 5,040 cGy/28 Fractions with Boost for Total Dose  of 6,3000 cGy    Assessment: 86yoF starting on heparin drip for atrial flutter with RVR. Hgb and PLT WNL, no signs of bleeding.   Pt on heparin, now to transition to apixaban. Age >29 and Wt <60kg so will use 2.5mg  dose.  Goal of Therapy:  Heparin level 0.3-0.7 units/ml Monitor platelets by anticoagulation protocol: Yes   Plan:  Stop heparin Begin apixaban 2.5mg  B[facility] Pharmacy will sign off, reconsult as needed  Arrie Senate, PharmD, BCPS Clinical Pharmacist [phone] Please check AMION for all [place] numbers 07/12/2020

## 2020-07-12 NOTE — Discharge Summary (Addendum)
Discharge Summary    Patient ID: Marie Burch MRN: [id number]; DOB: 05-20-1934  Admit date: 07/10/2020 Discharge date: 07/12/2020  Primary Care Provider: Lajean Manes, MD  Primary Cardiologist: Sinclair Grooms, MD  Primary Electrophysiologist:  None   Discharge Diagnoses    Active Problems:   Atherosclerosis of native coronary artery of native heart without angina pectoris   Essential hypertension   Atrial flutter, paroxysmal (HCC)   Atrial flutter (HCC)   Hx of CABG   Dementia ([place])   Pressure injury of skin   UTI (urinary tract infection)   Diagnostic Studies/Procedures    Echo 07/11/20  1. Prominant epicardial fat pad.  2. Left ventricular ejection fraction, by estimation, is 60 to 65%. The  left ventricle has normal function. The left ventricle has no regional  wall motion abnormalities. Left ventricular diastolic parameters are  indeterminate.  3. Right ventricular systolic function is normal. The right ventricular  size is normal. There is normal pulmonary artery systolic pressure.  4. Left atrial size was mildly dilated.  5. The mitral valve is degenerative. Mild mitral valve regurgitation. No  evidence of mitral stenosis.  6. The aortic valve is tricuspid. Aortic valve regurgitation is mild.  Mild to moderate aortic valve sclerosis/calcification is present, without  any evidence of aortic stenosis.  7. The inferior vena cava is normal in size with greater than 50%  respiratory variability, suggesting right atrial pressure of 3 mmHg.  _____________   History of Present Illness     Marie Burch is a 86 y.o. female with history of CAD s/p CABG in 2003 with MV repair, HLD, HTN, CKD, PSVT, and dementia who resides in a nursing home who was seen for aflutter RVR. Per report the patient was weak the day before presentation and unable to stand. She was also noted to be tachycardic but this resolved. No vomiting or diarrhea. No fevers. Due to the persistent  weakness EMS was called.   Hospital Course     Consultants: None  In the ER EKG showed aflutter with alternating 2:1 and 4:1 block. The patient denied any symptoms and said she felt fine. No chest pain, sob, dizziness, lightheadedness, vision changes, nausea, or vomiting. Hs troponin came back 17>27. COVID negative. With CHADSVASC of 5 the patient was admitted and started IV heparin. Iv cardizem was started for rate control of afib. Anticoagulation was discussed with the daughter who reported patient had fallen once in the last year and was therefore weary of long term anticoagulation, but okay for the short term. Aspirin was held in the setting of heparin. BP was low and home meds were held. Overnight the patient converted back to sinus rhythm. BP improved cardizem was consolidated to 30 mg BID. Echo showed  LVEF 60-65%, no RMA, mildly dilated LA, degenerative MV with mild MR, mild AI.  Initial UA was concerning for possible UTI. It appears she was recently prescribed keflex for this, will continue Keflex for a 7 days course at discharge. Anticoagulation was discussed with daughter again who agreed on starting Eliquis so this was started on low dose 2.5mg  for age and weight. Aspirin was discontinued. Plan to continue home atenolol and HCTZ. Will follow-up on TSH and order lipid panel as OP.   The patient was evaluated by Dr. Oval Linsey on 07/12/20 and felt to be stable for discharge. Plan to discharge patient back to [facility].   Did the patient have an acute coronary syndrome (MI, NSTEMI, STEMI, etc) this  admission?:  No                               Did the patient have a percutaneous coronary intervention (stent / angioplasty)?:  No.   _____________  Discharge Vitals Blood pressure (!) 141/67, pulse (!) 59, temperature 98.3 F (36.8 C), temperature source Oral, resp. rate 19, height 5\' 2"  (1.575 m), weight 55.3 kg, SpO2 95 %.  Filed Weights   07/10/20 2000 07/10/20 2225 07/12/20 0539  Weight:  55.4 kg 56.5 kg 55.3 kg    Labs & Radiologic Studies    CBC Recent Labs    07/10/20 1234  WBC 9.2  NEUTROABS 5.4  HGB 13.8  HCT 43.3  MCV 88.9  PLT 308   Basic Metabolic Panel Recent Labs    07/10/20 1234  NA 143  K 3.6  CL 102  CO2 28  GLUCOSE 103*  BUN 22  CREATININE 1.23*  CALCIUM 9.4  MG 1.9   Liver Function Tests Recent Labs    07/10/20 1234  AST 21  ALT 16  ALKPHOS 38  BILITOT 0.9  PROT 6.5  ALBUMIN 3.4*   No results for input(s): LIPASE, AMYLASE in the last 72 hours. High Sensitivity Troponin:   Recent Labs  Lab 07/10/20 1234 07/10/20 1533  TROPONINIHS 17 27*    BNP Invalid input(s): POCBNP D-Dimer No results for input(s): DDIMER in the last 72 hours. Hemoglobin A1C No results for input(s): HGBA1C in the last 72 hours. Fasting Lipid Panel No results for input(s): CHOL, HDL, LDLCALC, TRIG, CHOLHDL, LDLDIRECT in the last 72 hours. Thyroid Function Tests No results for input(s): TSH, T4TOTAL, T3FREE, THYROIDAB in the last 72 hours.  Invalid input(s): FREET3 _____________  DG Chest Port 1 View  Result Date: 07/10/2020 CLINICAL DATA:  Atrial flutter, history breast cancer, colon cancer EXAM: PORTABLE CHEST 1 VIEW COMPARISON:  Portable exam 6578 hours compared to 02/05/2014 FINDINGS: Upper normal size cardiac silhouette post CABG. Atherosclerotic calcification aorta. Mediastinal contours and pulmonary vascularity normal. Chronic peribronchial thickening and mild bibasilar atelectasis. No acute infiltrate, pleural effusion, or pneumothorax. Levoconvex thoracic scoliosis. IMPRESSION: Chronic bronchitic changes with bibasilar atelectasis. Electronically Signed   By: Lavonia Dana M.D.   On: 07/10/2020 12:50   ECHOCARDIOGRAM COMPLETE  Result Date: 07/11/2020    ECHOCARDIOGRAM REPORT   Patient Name:   Marie Burch Date of Exam: 07/11/2020 Medical Rec #:  [id number]     Height:       62.0 in Accession #:    [id number]    Weight:       124.6 lb Date of Birth:   August 08, 1934     BSA:          1.563 m Patient Age:    5 years      BP:           107/75 mmHg Patient Gender: F             HR:           70 bpm. Exam Location:  Inpatient Procedure: 2D Echo Indications:    atrial flutter 427.32  History:        Patient has no prior history of Echocardiogram examinations.                 Arrythmias:Atrial Flutter.  Sonographer:    Johny Chess Referring Phys: [id number] HAO MENG IMPRESSIONS  1. Prominant epicardial fat pad.  2. Left ventricular ejection fraction, by estimation, is 60 to 65%. The left ventricle has normal function. The left ventricle has no regional wall motion abnormalities. Left ventricular diastolic parameters are indeterminate.  3. Right ventricular systolic function is normal. The right ventricular size is normal. There is normal pulmonary artery systolic pressure.  4. Left atrial size was mildly dilated.  5. The mitral valve is degenerative. Mild mitral valve regurgitation. No evidence of mitral stenosis.  6. The aortic valve is tricuspid. Aortic valve regurgitation is mild. Mild to moderate aortic valve sclerosis/calcification is present, without any evidence of aortic stenosis.  7. The inferior vena cava is normal in size with greater than 50% respiratory variability, suggesting right atrial pressure of 3 mmHg. FINDINGS  Left Ventricle: Left ventricular ejection fraction, by estimation, is 60 to 65%. The left ventricle has normal function. The left ventricle has no regional wall motion abnormalities. The left ventricular internal cavity size was normal in size. There is  no left ventricular hypertrophy. Left ventricular diastolic parameters are indeterminate. Right Ventricle: The right ventricular size is normal. No increase in right ventricular wall thickness. Right ventricular systolic function is normal. There is normal pulmonary artery systolic pressure. The tricuspid regurgitant velocity is 2.20 m/s, and  with an assumed right atrial pressure of 3 mmHg,  the estimated right ventricular systolic pressure is 81.1 mmHg. Left Atrium: Left atrial size was mildly dilated. Right Atrium: Right atrial size was normal in size. Pericardium: There is no evidence of pericardial effusion. Mitral Valve: The mitral valve is degenerative in appearance. There is moderate thickening of the mitral valve leaflet(s). There is moderate calcification of the mitral valve leaflet(s). Normal mobility of the mitral valve leaflets. Moderate mitral annular calcification. Mild mitral valve regurgitation. No evidence of mitral valve stenosis. MV peak gradient, 13.2 mmHg. The mean mitral valve gradient is 4.0 mmHg. Tricuspid Valve: The tricuspid valve is normal in structure. Tricuspid valve regurgitation is mild . No evidence of tricuspid stenosis. Aortic Valve: The aortic valve is tricuspid. Aortic valve regurgitation is mild. Aortic regurgitation PHT measures 451 msec. Mild to moderate aortic valve sclerosis/calcification is present, without any evidence of aortic stenosis. Pulmonic Valve: The pulmonic valve was normal in structure. Pulmonic valve regurgitation is mild. No evidence of pulmonic stenosis. Aorta: The aortic root is normal in size and structure. Venous: The inferior vena cava is normal in size with greater than 50% respiratory variability, suggesting right atrial pressure of 3 mmHg. IAS/Shunts: No atrial level shunt detected by color flow Doppler. Additional Comments: Prominant epicardial fat pad.  LEFT VENTRICLE PLAX 2D LVIDd:         4.31 cm  Diastology LVIDs:         3.14 cm  LV e' lateral:   7.83 cm/s LV PW:         0.97 cm  LV E/e' lateral: 19.8 LV IVS:        0.97 cm  LV e' medial:    6.64 cm/s LVOT diam:     1.80 cm  LV E/e' medial:  23.3 LV SV:         31 LV SV Index:   20 LVOT Area:     2.54 cm  RIGHT VENTRICLE            IVC RV S prime:     9.03 cm/s  IVC diam: 1.59 cm LEFT ATRIUM             Index  RIGHT ATRIUM           Index LA diam:        4.20 cm 2.69 cm/m  RA  Area:     10.70 cm LA Vol (A2C):   45.9 ml 29.36 ml/m RA Volume:   19.90 ml  12.73 ml/m LA Vol (A4C):   38.2 ml 24.44 ml/m LA Biplane Vol: 44.1 ml 28.21 ml/m  AORTIC VALVE LVOT Vmax:   68.10 cm/s LVOT Vmean:  40.100 cm/s LVOT VTI:    0.120 m AI PHT:      451 msec  AORTA Ao Root diam: 2.80 cm Ao Asc diam:  3.40 cm MITRAL VALVE                TRICUSPID VALVE MV Area (PHT): 2.20 cm     TV Peak grad:   23.8 mmHg MV Peak grad:  13.2 mmHg    TV Vmax:        2.44 m/s MV Mean grad:  4.0 mmHg     TR Peak grad:   19.4 mmHg MV Vmax:       1.82 m/s     TR Vmax:        220.00 cm/s MV Vmean:      88.8 cm/s MV Decel Time: 345 msec     SHUNTS MV E velocity: 155.00 cm/s  Systemic VTI:  0.12 m MV A velocity: 49.80 cm/s   Systemic Diam: 1.80 cm MV E/A ratio:  3.11 Jenkins Rouge MD Electronically signed by Jenkins Rouge MD Signature Date/Time: 07/11/2020/9:16:47 AM    Final    Disposition   Pt is being discharged home today in good condition.  Follow-up Plans & Appointments     Discharge Instructions    Diet - low sodium heart healthy   Complete by: As directed    Increase activity slowly   Complete by: As directed    No wound care   Complete by: As directed       Discharge Medications   Allergies as of 07/12/2020      Reactions   Penicillins Anaphylaxis   Levaquin [levofloxacin] Other (See Comments)   PT TOLD TO LEAVE ANTIBIOTICS ALONE AFTER NEPHRECTOMY PT HAD REACTION TO ANTIBIOTICS   Niacin And Related Other (See Comments)   unknown   Norvasc [amlodipine Besylate] Other (See Comments)   Unknown, PT NOT SURE   Novocain [procaine Hcl]    Numbness would not leave    Pravachol [pravastatin Sodium] Other (See Comments)   Unknown, ? Questionable muscle aches   Statins Other (See Comments)   Unknown, questionable muscle aches   Sulfa Antibiotics    Zetia [ezetimibe] Other (See Comments)   Unknown, pt not sure      Medication List    STOP taking these medications   Aleve 220 MG Caps Generic  drug: Naproxen Sodium   aspirin EC 81 MG tablet     TAKE these medications   alendronate 70 MG tablet Commonly known as: FOSAMAX Take 70 mg by mouth once a week. Take with a full glass of water on an empty stomach.   apixaban 2.5 MG Tabs tablet Commonly known as: ELIQUIS Take 1 tablet (2.5 mg total) by mouth 2 (two) times daily.   atenolol 25 MG tablet Commonly known as: TENORMIN Take 25 mg by mouth every morning.   Calcium+D3 600-800 MG-UNIT Tabs Generic drug: Calcium Carb-Cholecalciferol Take 1 tablet by mouth daily.   cephALEXin 500 MG capsule Commonly known as:  KEFLEX Take 1 capsule (500 mg total) by mouth 4 (four) times daily.   clobetasol ointment 0.05 % Commonly known as: TEMOVATE Apply 1 application topically 2 (two) times daily.   clonazePAM 0.5 MG tablet Commonly known as: KLONOPIN Take 0.5 mg by mouth at bedtime.   diltiazem 30 MG tablet Commonly known as: CARDIZEM Take 1 tablet (30 mg total) by mouth 2 (two) times daily.   Fish Oil 1000 MG Caps Take 2,000 mg by mouth daily.   hydrochlorothiazide 25 MG tablet Commonly known as: HYDRODIURIL Take 25 mg by mouth daily.   HYDROcodone-acetaminophen 5-325 MG tablet Commonly known as: NORCO/VICODIN Take 1 tablet by mouth every 4 (four) hours as needed (pain).   Nitrostat 0.4 MG SL tablet Generic drug: nitroGLYCERIN DISSOLVE 1 TABLET UNDER THE TONGUE EVERY5 MINUTES AS NEEDED UP TO 3 DOSES   polyethylene glycol 17 g packet Commonly known as: MIRALAX / GLYCOLAX Take 17 g by mouth daily as needed (for constipation).   potassium chloride SA 20 MEQ tablet Commonly known as: KLOR-CON Take 20 mEq by mouth daily.   sertraline 100 MG tablet Commonly known as: ZOLOFT Take 100 mg by mouth daily.   traMADol 50 MG tablet Commonly known as: ULTRAM Take 50 mg by mouth every 6 (six) hours as needed for pain.          Outstanding Labs/Studies   Will follow-up on TSH Fasting lipid panel  Duration of  Discharge Encounter   Greater than 30 minutes including physician time.  Signed, Aissata Wilmore Ninfa Meeker, PA-C 07/12/2020, 12:39 PM

## 2020-07-12 NOTE — NC FL2 (Addendum)
[place] LEVEL OF CARE SCREENING TOOL     IDENTIFICATION  Patient Name: Marie Burch Birthdate: December 03, 1933 Sex: female Admission Date (Current Location): 07/10/2020  [facility] and [place] Number:  Herbalist and Address:  The [place]. [facility], [place] [address], [place], [place] [address]      Provider Number: [id number]  Attending Physician Name and Address:  Dorothy Spark, MD  Relative Name and Phone Number:  [name] [phone]    Current Level of Care: Hospital Recommended Level of Care: [place] Prior Approval Number:    Date Approved/Denied:   PASRR Number:    Discharge Plan: Other (Comment) (ALF [facility])    Current Diagnoses: Patient Active Problem List   Diagnosis Date Noted  . Hx of CABG 07/12/2020  . Dementia ([place]) 07/12/2020  . Pressure injury of skin 07/12/2020  . Atrial flutter ([place]) 07/11/2020  . Atrial flutter, paroxysmal ([place]) 07/10/2020  . S/P mitral valve repair 06/12/2014  . Atherosclerosis of native coronary artery of native heart without angina pectoris 06/12/2014  . Essential hypertension 06/12/2014  . History of PSVT (paroxysmal supraventricular tachycardia) 06/12/2014  . Colon cancer ([place]) 08/29/2012    Orientation RESPIRATION BLADDER Height & Weight     Self  Normal Continent Weight: 121 lb 14.6 oz (55.3 kg) Height:  5\' 2"  (157.5 cm)  BEHAVIORAL SYMPTOMS/MOOD NEUROLOGICAL BOWEL NUTRITION STATUS      Continent (WDL) Diet (No added salts)  AMBULATORY STATUS COMMUNICATION OF NEEDS Skin   Extensive Assist Verbally Other (Comment) (Approp. for ethnicity,dry,Ecchymosis, arm bilateral, PI Coccyx Medial;Right;Left Stage 1 Clean;Dry;Pink)                       Personal Care Assistance Level of Assistance  Bathing, Feeding, Dressing Bathing Assistance: Limited assistance Feeding assistance: Limited assistance (Needs Set up;Cardiac) Dressing Assistance: Limited assistance      Functional Limitations Info  Sight, Hearing, Speech Sight Info: Impaired Hearing Info: Adequate Speech Info: Adequate    SPECIAL CARE FACTORS FREQUENCY                       Contractures Contractures Info: Not present    Additional Factors Info  Code Status, Allergies Code Status Info: FULL Allergies Info: Penicillins,Levaquin,Niacin And Related,Norvasc,Novocain,Pravachol ,Statins,Sulfa Antibiotics,Zetia             Discharge Medications  Medication List    STOP taking these medications   Aleve 220 MG Caps Generic drug: Naproxen Sodium   aspirin EC 81 MG tablet     TAKE these medications   alendronate 70 MG tablet Commonly known as: FOSAMAX Take 70 mg by mouth once a week. Take with a full glass of water on an empty stomach.   apixaban 2.5 MG Tabs tablet Commonly known as: ELIQUIS Take 1 tablet (2.5 mg total) by mouth 2 (two) times daily.   atenolol 25 MG tablet Commonly known as: TENORMIN Take 25 mg by mouth every morning.   Calcium+D3 600-800 MG-UNIT Tabs Generic drug: Calcium Carb-Cholecalciferol Take 1 tablet by mouth daily.   cephALEXin 500 MG capsule Commonly known as: KEFLEX Take 1 capsule (500 mg total) by mouth 4 (four) times daily.   clobetasol ointment 0.05 % Commonly known as: TEMOVATE Apply 1 application topically 2 (two) times daily.   clonazePAM 0.5 MG tablet Commonly known as: KLONOPIN Take 0.5 mg by mouth at bedtime.   diltiazem 30 MG tablet Commonly known  as: CARDIZEM Take 1 tablet (30 mg total) by mouth 2 (two) times daily.   Fish Oil 1000 MG Caps Take 2,000 mg by mouth daily.   hydrochlorothiazide 25 MG tablet Commonly known as: HYDRODIURIL Take 25 mg by mouth daily.   HYDROcodone-acetaminophen 5-325 MG tablet Commonly known as: NORCO/VICODIN Take 1 tablet by mouth every 4 (four) hours as needed (pain).   Nitrostat 0.4 MG SL tablet Generic drug: nitroGLYCERIN DISSOLVE 1 TABLET UNDER THE  TONGUE EVERY5 MINUTES AS NEEDED UP TO 3 DOSES   polyethylene glycol 17 g packet Commonly known as: MIRALAX / GLYCOLAX Take 17 g by mouth daily as needed (for constipation).   potassium chloride SA 20 MEQ tablet Commonly known as: KLOR-CON Take 20 mEq by mouth daily.   sertraline 100 MG tablet Commonly known as: ZOLOFT Take 100 mg by mouth daily.   traMADol 50 MG tablet Commonly known as: ULTRAM Take 50 mg by mouth every 6 (six) hours as needed for pain.           Relevant Imaging Results:  Relevant Lab Results:   Additional Information [id number]  Trula Ore, LCSWA

## 2020-07-12 NOTE — TOC Transition Note (Signed)
Transition of Care [facility]) - CM/SW Discharge Note   Patient Details  Name: Marie Burch MRN: [id number] Date of Birth: 1934-01-21  Transition of [facility]) CM/SW Contact:  Trula Ore, [place] Phone Number: 07/12/2020, 12:57 PM   Clinical Narrative:     Patient will DC to: [facility]/ALF  Anticipated DC date: 07/12/2020  Family notified: [name]  Transport by: Corey Harold  ?  Per MD patient ready for DC to [facility]. RN, patient, patient's family, and facility notified of DC. Discharge Summary sent to facility. RN given number for report tele# [phone] RM#[id number]. DC packet on chart. Ambulance transport requested for patient.  CSW signing off.  Final next level of care: Assisted Living Barriers to Discharge: No Barriers Identified   Patient Goals and CMS Choice Patient states their goals for this hospitalization and ongoing recovery are:: to go to ALF   Choice offered to / list presented to : Adult [facility] [name])  Discharge Placement              Patient chooses bed at:  ([place]) Patient to be transferred to facility by: [place] Name of family member notified: [name] Patient and family notified of of transfer: 07/12/20  Discharge Plan and Services                                     Social Determinants of Health (SDOH) Interventions     Readmission Risk Interventions No flowsheet data found.

## 2020-07-12 NOTE — NC FL2 (Addendum)
[place] LEVEL OF CARE SCREENING TOOL     IDENTIFICATION  Patient Name: Marie Burch Birthdate: 03-10-1934 Sex: female Admission Date (Current Location): 07/10/2020  [facility] and [place] Number:  Herbalist and Address:  The [place]. [facility], [place] [address], [place], [place] [address]      Provider Number: [id number]  Attending Physician Name and Address:  Dorothy Spark, MD  Relative Name and Phone Number:  [name] [phone]    Current Level of Care: Hospital Recommended Level of Care: Memory Care Prior Approval Number:    Date Approved/Denied:   PASRR Number:    Discharge Plan: Other (Comment) ([facility])    Current Diagnoses: Patient Active Problem List   Diagnosis Date Noted  . Hx of CABG 07/12/2020  . Dementia ([place]) 07/12/2020  . Pressure injury of skin 07/12/2020  . UTI (urinary tract infection) 07/12/2020  . Atrial flutter ([place]) 07/11/2020  . Atrial flutter, paroxysmal ([place]) 07/10/2020  . S/P mitral valve repair 06/12/2014  . Atherosclerosis of native coronary artery of native heart without angina pectoris 06/12/2014  . Essential hypertension 06/12/2014  . History of PSVT (paroxysmal supraventricular tachycardia) 06/12/2014  . Colon cancer ([place]) 08/29/2012    Orientation RESPIRATION BLADDER Height & Weight     Self  Normal Continent Weight: 121 lb 14.6 oz (55.3 kg) Height:  5\' 2"  (157.5 cm)  BEHAVIORAL SYMPTOMS/MOOD NEUROLOGICAL BOWEL NUTRITION STATUS      Continent (WDL) Diet (No added salts)  AMBULATORY STATUS COMMUNICATION OF NEEDS Skin   Extensive Assist Verbally Other (Comment) (Approp. for ethnicity,dry,Ecchymosis, arm bilateral, PI Coccyx Medial;Right;Left Stage 1 Clean;Dry;Pink)                       Personal Care Assistance Level of Assistance  Bathing, Feeding, Dressing Bathing Assistance: Maximum assistance Feeding assistance: Limited assistance (Needs Set  up;Cardiac) Dressing Assistance: Limited assistance     Functional Limitations Info  Sight, Hearing, Speech Sight Info: Impaired Hearing Info: Adequate Speech Info: Adequate    SPECIAL CARE FACTORS FREQUENCY  PT (By licensed PT), OT (By licensed OT), Speech therapy     PT Frequency: Home Health Evaluate and Treat OT Frequency: Home Health Evaluate and Treat     Speech Therapy Frequency: Home Health Evaluate and Treat      Contractures Contractures Info: Not present    Additional Factors Info  Code Status, Allergies Code Status Info: FULL Allergies Info: Penicillins,Levaquin,Niacin And Related,Norvasc,Novocain,Pravachol ,Statins,Sulfa Antibiotics,Zetia           Current Medications (07/12/2020):   Discharge Medications: STOP taking these medications   Aleve 220 MG Caps Generic drug: Naproxen Sodium   aspirin EC 81 MG tablet     TAKE these medications   alendronate 70 MG tablet Commonly known as: FOSAMAX Take 70 mg by mouth once a week. Take with a full glass of water on an empty stomach.   apixaban 2.5 MG Tabs tablet Commonly known as: ELIQUIS Take 1 tablet (2.5 mg total) by mouth 2 (two) times daily.   atenolol 25 MG tablet Commonly known as: TENORMIN Take 25 mg by mouth every morning.   Calcium+D3 600-800 MG-UNIT Tabs Generic drug: Calcium Carb-Cholecalciferol Take 1 tablet by mouth daily.   cephALEXin 500 MG capsule Commonly known as: KEFLEX Take 1 capsule (500 mg total) by mouth 4 (four) times daily.   clobetasol ointment 0.05 % Commonly known as: TEMOVATE Apply 1 application topically 2 (two)  times daily.   clonazePAM 0.5 MG tablet Commonly known as: KLONOPIN Take 0.5 mg by mouth at bedtime.   diltiazem 30 MG tablet Commonly known as: CARDIZEM Take 1 tablet (30 mg total) by mouth 2 (two) times daily.   Fish Oil 1000 MG Caps Take 2,000 mg by mouth daily.   hydrochlorothiazide 25 MG tablet Commonly known as: HYDRODIURIL Take  25 mg by mouth daily.   HYDROcodone-acetaminophen 5-325 MG tablet Commonly known as: NORCO/VICODIN Take 1 tablet by mouth every 4 (four) hours as needed (pain).   Nitrostat 0.4 MG SL tablet Generic drug: nitroGLYCERIN DISSOLVE 1 TABLET UNDER THE TONGUE EVERY5 MINUTES AS NEEDED UP TO 3 DOSES   polyethylene glycol 17 g packet Commonly known as: MIRALAX / GLYCOLAX Take 17 g by mouth daily as needed (for constipation).   potassium chloride SA 20 MEQ tablet Commonly known as: KLOR-CON Take 20 mEq by mouth daily.   sertraline 100 MG tablet Commonly known as: ZOLOFT Take 100 mg by mouth daily.   traMADol 50 MG tablet Commonly known as: ULTRAM Take 50 mg by mouth every 6 (six) hours as needed for pain.     Relevant Imaging Results:  Relevant Lab Results:   Additional Information [id number]  Benard Halsted, LCSW

## 2020-07-12 NOTE — Progress Notes (Signed)
Report called to [place], Ellison Hughs.  Patient notified of transfer to [facility]. Partial dentures upper in mouth. Awaiting PTAR for transfer.

## 2020-07-12 NOTE — Telephone Encounter (Signed)
-----   Message from [place], PA-C sent at 07/12/2020 12:05 PM EDT ----- Regarding: lipid panel Please call and arrange fasting lipid panel for 1 week. H/o of hyperlipidemia and CAD. Thanks

## 2020-07-12 NOTE — Progress Notes (Signed)
Progress Note  Patient Name: Marie Burch Date of Encounter: 07/12/2020  [facility] Cardiologist: Sinclair Grooms, MD   Subjective   Patient remains in sinus with rates around 60bpm. She denies chest pain or sob.   Inpatient Medications    Scheduled Meds: . aspirin EC  81 mg Oral Daily  . atenolol  25 mg Oral q morning - 10a  . diltiazem  30 mg Oral BID   Continuous Infusions: . heparin 800 Units/hr (07/11/20 2256)   PRN Meds: acetaminophen, ondansetron (ZOFRAN) IV   Vital Signs    Vitals:   07/11/20 2023 07/11/20 2300 07/12/20 0539 07/12/20 0806  BP: 135/66 126/60  126/83  Pulse: 66 (!) 59  70  Resp: (!) 22 20  15   Temp: 98.8 F (37.1 C) 97.6 F (36.4 C)  98 F (36.7 C)  TempSrc: Oral Oral  Oral  SpO2: 96%   94%  Weight:   55.3 kg   Height:        Intake/Output Summary (Last 24 hours) at 07/12/2020 0843 Last data filed at 07/12/2020 0800 Gross per 24 hour  Intake 1008 ml  Output 850 ml  Net 158 ml   Last 3 Weights 07/12/2020 07/10/2020 07/10/2020  Weight (lbs) 121 lb 14.6 oz 124 lb 9 oz 122 lb 2.2 oz  Weight (kg) 55.3 kg 56.5 kg 55.4 kg      Telemetry    NSR< 1st Degree AVB, PVCs, HR 50-60s - Personally Reviewed  ECG    No new - Personally Reviewed  Physical Exam   GEN: No acute distress.   Neck: No JVD Cardiac: RRR, no murmurs, rubs, or gallops.  Respiratory: Clear to auscultation bilaterally. GI: Soft, nontender, non-distended  MS: No edema; No deformity. Neuro:  Nonfocal  Psych: Normal affect   Labs    High Sensitivity Troponin:   Recent Labs  Lab 07/10/20 1234 07/10/20 1533  TROPONINIHS 17 27*      Chemistry Recent Labs  Lab 07/10/20 1234  NA 143  K 3.6  CL 102  CO2 28  GLUCOSE 103*  BUN 22  CREATININE 1.23*  CALCIUM 9.4  PROT 6.5  ALBUMIN 3.4*  AST 21  ALT 16  ALKPHOS 38  BILITOT 0.9  GFRNONAA 40*  GFRAA 47*  ANIONGAP 13     Hematology Recent Labs  Lab 07/10/20 1234  WBC 9.2  RBC 4.87  HGB 13.8  HCT  43.3  MCV 88.9  MCH 28.3  MCHC 31.9  RDW 14.3  PLT 191    BNPNo results for input(s): BNP, PROBNP in the last 168 hours.   DDimer No results for input(s): DDIMER in the last 168 hours.   Radiology    DG Chest Port 1 View  Result Date: 07/10/2020 CLINICAL DATA:  Atrial flutter, history breast cancer, colon cancer EXAM: PORTABLE CHEST 1 VIEW COMPARISON:  Portable exam 1740 hours compared to 02/05/2014 FINDINGS: Upper normal size cardiac silhouette post CABG. Atherosclerotic calcification aorta. Mediastinal contours and pulmonary vascularity normal. Chronic peribronchial thickening and mild bibasilar atelectasis. No acute infiltrate, pleural effusion, or pneumothorax. Levoconvex thoracic scoliosis. IMPRESSION: Chronic bronchitic changes with bibasilar atelectasis. Electronically Signed   By: Lavonia Dana M.D.   On: 07/10/2020 12:50   ECHOCARDIOGRAM COMPLETE  Result Date: 07/11/2020    ECHOCARDIOGRAM REPORT   Patient Name:   Marie Burch Date of Exam: 07/11/2020 Medical Rec #:  [id number]     Height:       22.0  in Accession #:    [id number]    Weight:       124.6 lb Date of Birth:  [birth year]     BSA:          1.563 m Patient Age:    86 years      BP:           107/75 mmHg Patient Gender: F             HR:           70 bpm. Exam Location:  Inpatient Procedure: 2D Echo Indications:    atrial flutter 427.32  History:        Patient has no prior history of Echocardiogram examinations.                 Arrythmias:Atrial Flutter.  Sonographer:    Johny Chess Referring Phys: [id number] HAO MENG IMPRESSIONS  1. Prominant epicardial fat pad.  2. Left ventricular ejection fraction, by estimation, is 60 to 65%. The left ventricle has normal function. The left ventricle has no regional wall motion abnormalities. Left ventricular diastolic parameters are indeterminate.  3. Right ventricular systolic function is normal. The right ventricular size is normal. There is normal pulmonary artery systolic pressure.  4.  Left atrial size was mildly dilated.  5. The mitral valve is degenerative. Mild mitral valve regurgitation. No evidence of mitral stenosis.  6. The aortic valve is tricuspid. Aortic valve regurgitation is mild. Mild to moderate aortic valve sclerosis/calcification is present, without any evidence of aortic stenosis.  7. The inferior vena cava is normal in size with greater than 50% respiratory variability, suggesting right atrial pressure of 3 mmHg. FINDINGS  Left Ventricle: Left ventricular ejection fraction, by estimation, is 60 to 65%. The left ventricle has normal function. The left ventricle has no regional wall motion abnormalities. The left ventricular internal cavity size was normal in size. There is  no left ventricular hypertrophy. Left ventricular diastolic parameters are indeterminate. Right Ventricle: The right ventricular size is normal. No increase in right ventricular wall thickness. Right ventricular systolic function is normal. There is normal pulmonary artery systolic pressure. The tricuspid regurgitant velocity is 2.20 m/s, and  with an assumed right atrial pressure of 3 mmHg, the estimated right ventricular systolic pressure is 63.3 mmHg. Left Atrium: Left atrial size was mildly dilated. Right Atrium: Right atrial size was normal in size. Pericardium: There is no evidence of pericardial effusion. Mitral Valve: The mitral valve is degenerative in appearance. There is moderate thickening of the mitral valve leaflet(s). There is moderate calcification of the mitral valve leaflet(s). Normal mobility of the mitral valve leaflets. Moderate mitral annular calcification. Mild mitral valve regurgitation. No evidence of mitral valve stenosis. MV peak gradient, 13.2 mmHg. The mean mitral valve gradient is 4.0 mmHg. Tricuspid Valve: The tricuspid valve is normal in structure. Tricuspid valve regurgitation is mild . No evidence of tricuspid stenosis. Aortic Valve: The aortic valve is tricuspid. Aortic valve  regurgitation is mild. Aortic regurgitation PHT measures 451 msec. Mild to moderate aortic valve sclerosis/calcification is present, without any evidence of aortic stenosis. Pulmonic Valve: The pulmonic valve was normal in structure. Pulmonic valve regurgitation is mild. No evidence of pulmonic stenosis. Aorta: The aortic root is normal in size and structure. Venous: The inferior vena cava is normal in size with greater than 50% respiratory variability, suggesting right atrial pressure of 3 mmHg. IAS/Shunts: No atrial level shunt detected by color flow Doppler. Additional Comments: Prominant epicardial  fat pad.  LEFT VENTRICLE PLAX 2D LVIDd:         4.31 cm  Diastology LVIDs:         3.14 cm  LV e' lateral:   7.83 cm/s LV PW:         0.97 cm  LV E/e' lateral: 19.8 LV IVS:        0.97 cm  LV e' medial:    6.64 cm/s LVOT diam:     1.80 cm  LV E/e' medial:  23.3 LV SV:         31 LV SV Index:   20 LVOT Area:     2.54 cm  RIGHT VENTRICLE            IVC RV S prime:     9.03 cm/s  IVC diam: 1.59 cm LEFT ATRIUM             Index       RIGHT ATRIUM           Index LA diam:        4.20 cm 2.69 cm/m  RA Area:     10.70 cm LA Vol (A2C):   45.9 ml 29.36 ml/m RA Volume:   19.90 ml  12.73 ml/m LA Vol (A4C):   38.2 ml 24.44 ml/m LA Biplane Vol: 44.1 ml 28.21 ml/m  AORTIC VALVE LVOT Vmax:   68.10 cm/s LVOT Vmean:  40.100 cm/s LVOT VTI:    0.120 m AI PHT:      451 msec  AORTA Ao Root diam: 2.80 cm Ao Asc diam:  3.40 cm MITRAL VALVE                TRICUSPID VALVE MV Area (PHT): 2.20 cm     TV Peak grad:   23.8 mmHg MV Peak grad:  13.2 mmHg    TV Vmax:        2.44 m/s MV Mean grad:  4.0 mmHg     TR Peak grad:   19.4 mmHg MV Vmax:       1.82 m/s     TR Vmax:        220.00 cm/s MV Vmean:      88.8 cm/s MV Decel Time: 345 msec     SHUNTS MV E velocity: 155.00 cm/s  Systemic VTI:  0.12 m MV A velocity: 49.80 cm/s   Systemic Diam: 1.80 cm MV E/A ratio:  3.11 Jenkins Rouge MD Electronically signed by Jenkins Rouge MD Signature  Date/Time: 07/11/2020/9:16:47 AM    Final     Cardiac Studies   Echo 07/10/20  1. Prominant epicardial fat pad.  2. Left ventricular ejection fraction, by estimation, is 60 to 65%. The  left ventricle has normal function. The left ventricle has no regional  wall motion abnormalities. Left ventricular diastolic parameters are  indeterminate.  3. Right ventricular systolic function is normal. The right ventricular  size is normal. There is normal pulmonary artery systolic pressure.  4. Left atrial size was mildly dilated.  5. The mitral valve is degenerative. Mild mitral valve regurgitation. No  evidence of mitral stenosis.  6. The aortic valve is tricuspid. Aortic valve regurgitation is mild.  Mild to moderate aortic valve sclerosis/calcification is present, without  any evidence of aortic stenosis.  7. The inferior vena cava is normal in size with greater than 50%  respiratory variability, suggesting right atrial pressure of 3 mmHg.  Patient Profile     86 y.o. female with  pmh of CAD s/p CABG, PSVT, HTN, HLD, MVR 2003 with CABG, CKD, dementia, in a nursing home who is being seen for aflutter RVR.  Assessment & Plan    New onset Aflutter RVR - CHADSVASC 5 - Patient converted to sinus - Anticoagulation discussed with daughter given patient's instability.  - IV heparin for now - IV cardizem for rate control>>transitioned to PO cardizem 30 mg BID. Heart rates around 60bpm - Echo showed LVEF 60-65%, no WMA, mildly dilated LA, degenerative MV, mild MR, mild AI - Will likely be started on a/c given chadsvasc score. Can likely be discharged.   CAD s/p CABG 2003 - asymptomatic - Aspirin held for IV heparin  HTN - home meds held for soft pressures - pta HCTZ and atenolol - pressures improved  For questions or updates, please contact [facility] Please consult [email or web address] for contact info under        Signed, Pedro Oldenburg Ninfa Meeker, PA-C  07/12/2020, 8:43 AM

## 2020-07-16 NOTE — Telephone Encounter (Signed)
Transition Care Management Follow-up Telephone Call  Date of discharge and from where: 07/12/20 [facility]  How have you been since you were released from the hospital? Patient is at a facility for memory care. Patient's daughter is the only living relative, no DPR on file. Daughter is POA and will bring in paper work and get DPR filled out at the appointment  Any questions or concerns? No  Items Reviewed:  Did the pt receive and understand the discharge instructions provided? Patient at facility  Medications obtained and verified? Facility should have obtained and verified   Any new allergies since your discharge? N/A  Dietary orders reviewed? Facility provides meals  Do you have support at home? Yes   Functional Questionnaire: (I = Independent and D = Dependent) ADLs: D  Bathing/Dressing- D  Meal Prep- D  Eating- I  Maintaining continence- D  Transferring/Ambulation- D  Managing Meds- D  Follow up appointments reviewed:   PCP Hospital f/u appt confirmed? Per patient's daughter they have no appointment in the near future, but have an appointment in December  Specialist Hospital f/u appt confirmed? Yes  Scheduled to see Richardson Dopp PA on 07/24/20 @ 8:45 am.  Are transportation arrangements needed? Facility provides transportation.  If their condition worsens, is the pt aware to call PCP or go to the Emergency Dept.? N/A patient is in a facility.  Was the patient provided with contact information for the PCP's office or ED? N/A  Was to pt encouraged to call back with questions or concerns?N/A

## 2020-07-17 DIAGNOSIS — Z1159 Encounter for screening for other viral diseases: Secondary | ICD-10-CM | POA: Diagnosis not present

## 2020-07-17 DIAGNOSIS — Z20828 Contact with and (suspected) exposure to other viral communicable diseases: Secondary | ICD-10-CM | POA: Diagnosis not present

## 2020-07-18 DIAGNOSIS — R278 Other lack of coordination: Secondary | ICD-10-CM | POA: Diagnosis not present

## 2020-07-18 DIAGNOSIS — R2681 Unsteadiness on feet: Secondary | ICD-10-CM | POA: Diagnosis not present

## 2020-07-18 DIAGNOSIS — R296 Repeated falls: Secondary | ICD-10-CM | POA: Diagnosis not present

## 2020-07-18 DIAGNOSIS — M6281 Muscle weakness (generalized): Secondary | ICD-10-CM | POA: Diagnosis not present

## 2020-07-19 DIAGNOSIS — M6281 Muscle weakness (generalized): Secondary | ICD-10-CM | POA: Diagnosis not present

## 2020-07-19 DIAGNOSIS — R278 Other lack of coordination: Secondary | ICD-10-CM | POA: Diagnosis not present

## 2020-07-19 DIAGNOSIS — R2681 Unsteadiness on feet: Secondary | ICD-10-CM | POA: Diagnosis not present

## 2020-07-19 DIAGNOSIS — R296 Repeated falls: Secondary | ICD-10-CM | POA: Diagnosis not present

## 2020-07-22 DIAGNOSIS — R2681 Unsteadiness on feet: Secondary | ICD-10-CM | POA: Diagnosis not present

## 2020-07-22 DIAGNOSIS — R278 Other lack of coordination: Secondary | ICD-10-CM | POA: Diagnosis not present

## 2020-07-22 DIAGNOSIS — M6281 Muscle weakness (generalized): Secondary | ICD-10-CM | POA: Diagnosis not present

## 2020-07-22 DIAGNOSIS — R296 Repeated falls: Secondary | ICD-10-CM | POA: Diagnosis not present

## 2020-07-23 DIAGNOSIS — R296 Repeated falls: Secondary | ICD-10-CM | POA: Diagnosis not present

## 2020-07-23 DIAGNOSIS — R2681 Unsteadiness on feet: Secondary | ICD-10-CM | POA: Diagnosis not present

## 2020-07-23 DIAGNOSIS — M6281 Muscle weakness (generalized): Secondary | ICD-10-CM | POA: Diagnosis not present

## 2020-07-23 DIAGNOSIS — R278 Other lack of coordination: Secondary | ICD-10-CM | POA: Diagnosis not present

## 2020-07-23 NOTE — Progress Notes (Signed)
Cardiology Office Note:    Date:  07/24/2020   ID:  Marie Burch 02-18-1934, MRN [id number]  PCP:  Lajean Manes, MD  [facility] Cardiologist:  Sinclair Grooms, MD   [facility] Electrophysiologist:  None   Referring MD: Lajean Manes, MD   Chief Complaint:  Hospitalization Follow-up (AFlutter w RVR )    Patient Profile:    Marie Burch is a 86 y.o. female with:   Coronary artery disease  MV disease   S/p CABG + MV repair in 2003  Paroxysmal AFlutter  CHA2DS2-VASc=5 >> Apixaban  Admx 9/21: converted with rate control   Hyperlipidemia   Hypertension   PSVT   Chronic kidney disease   Breast CA  Colon CA  Dementia   Prior CV studies: Echocardiogram 07/11/20 EF 60-65, no RWMA, normal RVSF, mild LAE, mild MR, mild AI, mild to mod AoV sclerosis, no AS  Myoview 07/11/14 EF 68, no ischemia, low risk  Carotid US 02/26/12 +Plaque; No ICA stenosis  History of Present Illness:    Marie Burch was last seen in clinic in 2017 by Truitt Merle, NP.  She was recently admitted 9/1-9/3 with AFlutter with RVR.  She was started on IV Diltiazem and IV Heparin.  She converted to NSR.  CHA2DS2-VASc=5 (age x 2, HTN, CAD, female).  She was started on Apixaban for anticoagulation.  An echocardiogram demonstrated normal LVF.  A TSH was normal.  She returns for follow up.      She lives in the memory unit at [facility].  She is here today with her daughter.  Prior to admission, she had some swelling in her ankles which was new and she was extremely fatigued.  The staff had noticed some high HRs prior to this.  Today, she is back in AFlutter with HR 140.  She is not aware of this.  Her daughter has not noted the same level of fatigue as before.  She has not had chest pain, shortness of breath, syncope.        Past Medical History:  Diagnosis Date  . Anxiety   . Arthritis   . Blood transfusion 1960's   During Nephrectomy Procedure  . Cancer [facility]) 2001 and  2002   breast, right  . Cancer of colon ([place]) 08/03/12  . Chronic kidney disease    history of right nephrectomy due to stone '60's  . Complication of anesthesia    "I quit breathing" (patient reported it was due to PCN allergy though '60's)  . Coronary artery disease   . Depression   . GERD (gastroesophageal reflux disease)   . Heart murmur    s/p MV ring annuloplasty '03  . Hyperlipidemia   . Hypertension    dr Daneen Schick  . S/P radiation therapy 11/29/01 - 03/18/02   Right Breast: 5,040 cGy/28 Fractions with Boost for Total Dose  of 6,3000 cGy    Current Medications: Current Meds  Medication Sig  . alendronate (FOSAMAX) 70 MG tablet Take 70 mg by mouth once a week. Take with a full glass of water on an empty stomach.  Marland Kitchen apixaban (ELIQUIS) 2.5 MG TABS tablet Take 1 tablet (2.5 mg total) by mouth 2 (two) times daily.  Marland Kitchen atenolol (TENORMIN) 25 MG tablet Take 25 mg by mouth every morning.   . Calcium Carb-Cholecalciferol (CALCIUM+D3) 600-800 MG-UNIT TABS Take 1 tablet by mouth daily.  . cephALEXin (KEFLEX) 500 MG capsule Take 1 capsule (500 mg total) by mouth  4 (four) times daily.  . clobetasol ointment (TEMOVATE) 1.02 % Apply 1 application topically 2 (two) times daily.  . clonazePAM (KLONOPIN) 0.5 MG tablet Take 0.5 mg by mouth at bedtime.   Marland Kitchen diltiazem (CARDIZEM) 30 MG tablet Take 1 tablet (30 mg total) by mouth 2 (two) times daily.  . hydrochlorothiazide (HYDRODIURIL) 25 MG tablet Take 25 mg by mouth daily.  Marland Kitchen NITROSTAT 0.4 MG SL tablet DISSOLVE 1 TABLET UNDER THE TONGUE EVERY5 MINUTES AS NEEDED UP TO 3 DOSES  . Omega-3 Fatty Acids (FISH OIL) 1000 MG CAPS Take 2,000 mg by mouth daily.   . polyethylene glycol (MIRALAX / GLYCOLAX) packet Take 17 g by mouth daily as needed (for constipation).  . potassium chloride SA (KLOR-CON) 20 MEQ tablet Take 20 mEq by mouth daily.  . sertraline (ZOLOFT) 100 MG tablet Take 100 mg by mouth daily.  . traMADol (ULTRAM) 50 MG tablet Take 50 mg by  mouth every 6 (six) hours as needed for pain.      Allergies:   Penicillins, Levaquin [levofloxacin], Niacin and related, Norvasc [amlodipine besylate], Novocain [procaine hcl], Pravachol [pravastatin sodium], Statins, Sulfa antibiotics, and Zetia [ezetimibe]   Social History   Tobacco Use  . Smoking status: Never Smoker  . Smokeless tobacco: Never Used  Substance Use Topics  . Alcohol use: No  . Drug use: No     Family Hx: The patient's family history includes Heart disease in her father and mother.  Review of Systems  Gastrointestinal: Negative for hematochezia and melena.  Genitourinary: Negative for hematuria.  All other systems reviewed and are negative.    EKGs/Labs/Other Test Reviewed:    EKG:  EKG is  ordered today.  The ekg ordered today demonstrates Atrial Flutter, HR 140, 2:1 conduction, IVCD, QTc 512  Recent Labs: 07/10/2020: ALT 16; BUN 22; Creatinine, Ser 1.23; Magnesium 1.9; Potassium 3.6; Sodium 143 07/12/2020: Hemoglobin 12.6; Platelets 180; TSH 1.107   Recent Lipid Panel Lab Results  Component Value Date/Time   CHOL 199 07/12/2020 01:03 PM   TRIG 181 (H) 07/12/2020 01:03 PM   HDL 33 (L) 07/12/2020 01:03 PM   CHOLHDL 6.0 07/12/2020 01:03 PM   LDLCALC 130 (H) 07/12/2020 01:03 PM    Physical Exam:    VS:  BP 124/80   Pulse (!) 139   Ht 5\' 2"  (1.575 m)   Wt 119 lb 12.8 oz (54.3 kg)   SpO2 98%   BMI 21.91 kg/m     Wt Readings from Last 3 Encounters:  07/24/20 119 lb 12.8 oz (54.3 kg)  07/12/20 121 lb 14.6 oz (55.3 kg)  10/21/16 109 lb 12.8 oz (49.8 kg)     Constitutional:      Appearance: Healthy appearance. Not in distress.  Neck:     Vascular: No JVR.     Lymphadenopathy: No cervical adenopathy.  Pulmonary:     Effort: Pulmonary effort is normal.     Breath sounds: No wheezing. No rales.  Cardiovascular:     Tachycardia present. Regular rhythm. Normal S1. Normal S2.     Murmurs: There is no murmur.  Edema:    Peripheral edema absent.    Abdominal:     Palpations: Abdomen is soft.  Musculoskeletal:     Cervical back: Neck supple. Skin:    General: Skin is warm and dry.  Neurological:     Mental Status: Alert and oriented to person, place and time.     Cranial Nerves: Cranial nerves are intact.  Psychiatric:        Mood and Affect: Affect is flat.       ASSESSMENT & PLAN:    1. Paroxysmal atrial flutter ([place]) She is back in AFlutter w RVR w HR 140 (2:1 conduction).  She has dementia.  However, she does not seem to be experiencing any symptoms at this time.  Unfortunately, the facility she is at does not check a pulse on a regular basis.  Also, she was not started on Eliquis at the facility until 07/17/20 (5 days after DC).  It is not clear how long she has been in AFlutter.  We should likely start Amiodarone to help control her rhythm.  I reviewed her case with Dr. Tamala Julian today.  We feel it is safe to start her on Amiodarone now.  We will see her back in the next 2 weeks to repeat an ECG to make sure her rate is better and she is not having issues with bradycardia.  As she is currently not having any symptoms, we feel that she does not need hospitalization.  Hopefully we can avoid cardioversion.  -Start amiodarone 200 mg twice daily x2 weeks  -Then reduce amiodarone to 2 mg daily  -Nursing home should check pulse of patient is having symptoms and call us with results  -Follow-up with me or Dr. Tamala Julian in 2 weeks  -Continue apixaban 2.5 mg twice daily  2. Coronary artery disease involving native coronary artery of native heart without angina pectoris Hx of CABG in 2003.  She is not having any apparent angina. She is not on ASA as she is now on Apixaban.    3. Essential hypertension The patient's blood pressure is controlled on her current regimen.  Continue current therapy.   4. S/P MVR (mitral valve repair) Stable MV repair by recent echocardiogram.  Continue SBE prophylaxis.      Dispo:  Return in about 2 weeks (around  08/07/2020) for Close Follow Up with Dr. Tamala Julian, or Richardson Dopp, PA-C, in person.   Medication Adjustments/Labs and Tests Ordered: Current medicines are reviewed at length with the patient today.  Concerns regarding medicines are outlined above.  Tests Ordered: Orders Placed This Encounter  Procedures  . EKG 12-Lead   Medication Changes: Meds ordered this encounter  Medications  . amiodarone (PACERONE) 200 MG tablet    Sig: Take 1 tablet by mouth twice a day for 2 weeks, then take 1 tablet by mouth once a day    Dispense:  45 tablet    Refill:  3    Signed, Richardson Dopp, PA-C  07/24/2020 9:50 AM    [address], [place], [place]  [address] Phone: [phone]; Fax: [phone]

## 2020-07-24 ENCOUNTER — Other Ambulatory Visit: Payer: Self-pay

## 2020-07-24 ENCOUNTER — Encounter: Payer: Self-pay | Admitting: Physician Assistant

## 2020-07-24 ENCOUNTER — Telehealth: Payer: Self-pay | Admitting: Physician Assistant

## 2020-07-24 ENCOUNTER — Ambulatory Visit: Payer: PPO | Admitting: Physician Assistant

## 2020-07-24 VITALS — BP 124/80 | HR 139 | Ht 62.0 in | Wt 119.8 lb

## 2020-07-24 DIAGNOSIS — I4892 Unspecified atrial flutter: Secondary | ICD-10-CM

## 2020-07-24 DIAGNOSIS — I10 Essential (primary) hypertension: Secondary | ICD-10-CM

## 2020-07-24 DIAGNOSIS — I2510 Atherosclerotic heart disease of native coronary artery without angina pectoris: Secondary | ICD-10-CM

## 2020-07-24 DIAGNOSIS — R2681 Unsteadiness on feet: Secondary | ICD-10-CM | POA: Diagnosis not present

## 2020-07-24 DIAGNOSIS — Z1159 Encounter for screening for other viral diseases: Secondary | ICD-10-CM | POA: Diagnosis not present

## 2020-07-24 DIAGNOSIS — Z9889 Other specified postprocedural states: Secondary | ICD-10-CM

## 2020-07-24 DIAGNOSIS — R296 Repeated falls: Secondary | ICD-10-CM | POA: Diagnosis not present

## 2020-07-24 DIAGNOSIS — Z20828 Contact with and (suspected) exposure to other viral communicable diseases: Secondary | ICD-10-CM | POA: Diagnosis not present

## 2020-07-24 DIAGNOSIS — M6281 Muscle weakness (generalized): Secondary | ICD-10-CM | POA: Diagnosis not present

## 2020-07-24 DIAGNOSIS — R278 Other lack of coordination: Secondary | ICD-10-CM | POA: Diagnosis not present

## 2020-07-24 MED ORDER — AMIODARONE HCL 200 MG PO TABS: ORAL_TABLET | ORAL | 3 refills | Status: DC

## 2020-07-24 NOTE — Addendum Note (Signed)
Addended byKathlen Mody, Nicki Reaper T on: 07/24/2020 09:57 AM   Modules accepted: Level of Service

## 2020-07-24 NOTE — Telephone Encounter (Signed)
I called and spoke with [name], she states that [facility] [employer] will not take printed AVS for Amiodarone start or orders to check pulse if patient feels weak or fatigued. Prescriptions filled out for Amiodarone and order to check pulse. Given to [place] to sign.

## 2020-07-24 NOTE — Patient Instructions (Signed)
Medication Instructions:  Your physician has recommended you make the following change in your medication:   1) Start Amiodarone 200 mg; Take 1 tablet by mouth twice a day for 2 weeks, then go to 1 tablet by mouth once a day  *If you need a refill on your cardiac medications before your next appointment, please call your pharmacy*  Lab Work: None ordered today  If you have labs (blood work) drawn today and your tests are completely normal, you will receive your results only by: Marland Kitchen MyChart Message (if you have MyChart) OR . A paper copy in the mail If you have any lab test that is abnormal or we need to change your treatment, we will call you to review the results.  Testing/Procedures: None ordered today  Follow-Up: On 08/09/20 at 10:20AM with Daneen Schick, MD  Other Instructions If patient feels weak and fatigued check pulse and call the office at [phone]

## 2020-07-24 NOTE — Telephone Encounter (Signed)
Pt's daughter is calling and requested to speak with Raquel Sarna.

## 2020-07-25 DIAGNOSIS — R296 Repeated falls: Secondary | ICD-10-CM | POA: Diagnosis not present

## 2020-07-25 DIAGNOSIS — R278 Other lack of coordination: Secondary | ICD-10-CM | POA: Diagnosis not present

## 2020-07-25 DIAGNOSIS — R2681 Unsteadiness on feet: Secondary | ICD-10-CM | POA: Diagnosis not present

## 2020-07-25 DIAGNOSIS — M6281 Muscle weakness (generalized): Secondary | ICD-10-CM | POA: Diagnosis not present

## 2020-07-26 DIAGNOSIS — M10071 Idiopathic gout, right ankle and foot: Secondary | ICD-10-CM | POA: Diagnosis not present

## 2020-07-26 DIAGNOSIS — R2681 Unsteadiness on feet: Secondary | ICD-10-CM | POA: Diagnosis not present

## 2020-07-26 DIAGNOSIS — R296 Repeated falls: Secondary | ICD-10-CM | POA: Diagnosis not present

## 2020-07-26 DIAGNOSIS — R278 Other lack of coordination: Secondary | ICD-10-CM | POA: Diagnosis not present

## 2020-07-26 DIAGNOSIS — M6281 Muscle weakness (generalized): Secondary | ICD-10-CM | POA: Diagnosis not present

## 2020-07-30 DIAGNOSIS — R2681 Unsteadiness on feet: Secondary | ICD-10-CM | POA: Diagnosis not present

## 2020-07-30 DIAGNOSIS — Z1159 Encounter for screening for other viral diseases: Secondary | ICD-10-CM | POA: Diagnosis not present

## 2020-07-30 DIAGNOSIS — R296 Repeated falls: Secondary | ICD-10-CM | POA: Diagnosis not present

## 2020-07-30 DIAGNOSIS — Z20828 Contact with and (suspected) exposure to other viral communicable diseases: Secondary | ICD-10-CM | POA: Diagnosis not present

## 2020-07-30 DIAGNOSIS — M6281 Muscle weakness (generalized): Secondary | ICD-10-CM | POA: Diagnosis not present

## 2020-07-30 DIAGNOSIS — R278 Other lack of coordination: Secondary | ICD-10-CM | POA: Diagnosis not present

## 2020-07-31 DIAGNOSIS — R278 Other lack of coordination: Secondary | ICD-10-CM | POA: Diagnosis not present

## 2020-07-31 DIAGNOSIS — M6281 Muscle weakness (generalized): Secondary | ICD-10-CM | POA: Diagnosis not present

## 2020-07-31 DIAGNOSIS — R2681 Unsteadiness on feet: Secondary | ICD-10-CM | POA: Diagnosis not present

## 2020-07-31 DIAGNOSIS — R296 Repeated falls: Secondary | ICD-10-CM | POA: Diagnosis not present

## 2020-08-01 DIAGNOSIS — M6281 Muscle weakness (generalized): Secondary | ICD-10-CM | POA: Diagnosis not present

## 2020-08-01 DIAGNOSIS — R278 Other lack of coordination: Secondary | ICD-10-CM | POA: Diagnosis not present

## 2020-08-01 DIAGNOSIS — R2681 Unsteadiness on feet: Secondary | ICD-10-CM | POA: Diagnosis not present

## 2020-08-01 DIAGNOSIS — R296 Repeated falls: Secondary | ICD-10-CM | POA: Diagnosis not present

## 2020-08-02 DIAGNOSIS — M6281 Muscle weakness (generalized): Secondary | ICD-10-CM | POA: Diagnosis not present

## 2020-08-02 DIAGNOSIS — Z20828 Contact with and (suspected) exposure to other viral communicable diseases: Secondary | ICD-10-CM | POA: Diagnosis not present

## 2020-08-02 DIAGNOSIS — Z1159 Encounter for screening for other viral diseases: Secondary | ICD-10-CM | POA: Diagnosis not present

## 2020-08-02 DIAGNOSIS — R278 Other lack of coordination: Secondary | ICD-10-CM | POA: Diagnosis not present

## 2020-08-02 DIAGNOSIS — R296 Repeated falls: Secondary | ICD-10-CM | POA: Diagnosis not present

## 2020-08-02 DIAGNOSIS — R2681 Unsteadiness on feet: Secondary | ICD-10-CM | POA: Diagnosis not present

## 2020-08-05 DIAGNOSIS — R296 Repeated falls: Secondary | ICD-10-CM | POA: Diagnosis not present

## 2020-08-05 DIAGNOSIS — R278 Other lack of coordination: Secondary | ICD-10-CM | POA: Diagnosis not present

## 2020-08-05 DIAGNOSIS — M6281 Muscle weakness (generalized): Secondary | ICD-10-CM | POA: Diagnosis not present

## 2020-08-05 DIAGNOSIS — R2681 Unsteadiness on feet: Secondary | ICD-10-CM | POA: Diagnosis not present

## 2020-08-05 NOTE — Progress Notes (Signed)
Cardiology Office Note:    Date:  08/09/2020   ID:  Marie, Burch July 02, 1934, MRN [id number]  PCP:  Lajean Manes, MD  Cardiologist:  Sinclair Grooms, MD   Referring MD: Lajean Manes, MD   Chief Complaint  Patient presents with  . Atrial Fibrillation    History of Present Illness:    Marie Burch is a 86 y.o. female with a hx of Coronary artery disease S/P cabg AND mv REPAIR 2003, Paroxysmal atrial flutter (CARDIOVERSION 07/2020),  CHADS VASC 5, chronic anticoagulation - Apixaban, hypertension, hyperlipidemia, CKD III, Dementia, and h/o colon CA.  [name] is sedate.  She has no complaints.  She is accompanied by her daughter.  Despite having episodes of atrial flutter that have been prolonged, she has been asymptomatic.  She is now on anticoagulation for 1 month.  When seen in the office 2 weeks ago she was in atrial flutter with a rate of 140 bpm.  At that point we decided to start amiodarone 200 mg twice daily and have her follow-up today to determine if she has had pharmacologic conversion or would require electrical cardioversion.  Likely today her heart rate is 56 and EKG demonstrates sinus rhythm with first-degree AV block.  She feels no different now than she did on her last visit here.  When she was admitted to the hospital on September 1, the long-term care facility had noted increased weakness and dyspnea.  That has not been present recently.  Past Medical History:  Diagnosis Date  . Anxiety   . Arthritis   . Blood transfusion 1960's   During Nephrectomy Procedure  . Cancer [facility]) 2001 and 2002   breast, right  . Cancer of colon ([place]) 08/03/12  . Chronic kidney disease    history of right nephrectomy due to stone '60's  . Complication of anesthesia    "I quit breathing" (patient reported it was due to PCN allergy though '60's)  . Coronary artery disease   . Depression   . GERD (gastroesophageal reflux disease)   . Heart murmur    s/p MV ring  annuloplasty '03  . Hyperlipidemia   . Hypertension    dr Daneen Schick  . S/P radiation therapy 11/29/01 - 03/18/02   Right Breast: 5,040 cGy/28 Fractions with Boost for Total Dose  of 6,3000 cGy    Past Surgical History:  Procedure Laterality Date  . ABDOMINAL HYSTERECTOMY    . APPENDECTOMY    . APPENDECTOMY  08/03/2012   Procedure: APPENDECTOMY;  Surgeon: Madilyn Hook, DO;  Location: [facility];  Service: General;  Laterality: N/A;  incidental appendectomy  . BREAST LUMPECTOMY    . BREAST SURGERY Right 2001  . catarects Bilateral few yrs ago  . COLON SURGERY  07-2012  . COLONOSCOPY WITH PROPOFOL N/A 08/08/2013   Procedure: COLONOSCOPY WITH PROPOFOL;  Surgeon: Garlan Fair, MD;  Location: [facility];  Service: Endoscopy;  Laterality: N/A;  . CORONARY ARTERY BYPASS GRAFT  2003   CABG X 2 (SVGs to DIAG and OM)/MV Repair '03  . DIAGNOSTIC LAPAROSCOPY    . EYE SURGERY      lump removed rt eye  . kidney removed  1960's  . LAPAROTOMY  08/03/2012   Procedure: LAPAROTOMY for Left  Hemicolectomy   ;  Surgeon: Madilyn Hook, DO;  Location: [facility];  Service: General;  Laterality: N/A;  Mini Laparotomy  . PROCTOSCOPY  08/03/2012   Procedure: PROCTOSCOPY;  Surgeon: Madilyn Hook, DO;  Location: [facility];  Service: General;  Laterality: N/A;    Current Medications: Current Meds  Medication Sig  . alendronate (FOSAMAX) 70 MG tablet Take 70 mg by mouth once a week. Take with a full glass of water on an empty stomach.  Marland Kitchen amiodarone (PACERONE) 200 MG tablet Take 1 tablet by mouth twice a day for 2 weeks, then take 1 tablet by mouth once a day  . apixaban (ELIQUIS) 2.5 MG TABS tablet Take 1 tablet (2.5 mg total) by mouth 2 (two) times daily.  Marland Kitchen atenolol (TENORMIN) 25 MG tablet Take 25 mg by mouth every morning.   . Calcium Carb-Cholecalciferol (CALCIUM+D3) 600-800 MG-UNIT TABS Take 1 tablet by mouth daily.  . clobetasol ointment (TEMOVATE) 9.73 % Apply 1 application topically 2 (two) times daily.  .  clonazePAM (KLONOPIN) 0.5 MG tablet Take 0.5 mg by mouth at bedtime.   . hydrochlorothiazide (HYDRODIURIL) 25 MG tablet Take 25 mg by mouth daily.  Marland Kitchen NITROSTAT 0.4 MG SL tablet DISSOLVE 1 TABLET UNDER THE TONGUE EVERY5 MINUTES AS NEEDED UP TO 3 DOSES  . Omega-3 Fatty Acids (FISH OIL) 1000 MG CAPS Take 2,000 mg by mouth daily.   . polyethylene glycol (MIRALAX / GLYCOLAX) packet Take 17 g by mouth daily as needed (for constipation).  . potassium chloride SA (KLOR-CON) 20 MEQ tablet Take 20 mEq by mouth daily.  . sertraline (ZOLOFT) 100 MG tablet Take 100 mg by mouth daily.  . traMADol (ULTRAM) 50 MG tablet Take 50 mg by mouth every 6 (six) hours as needed for pain.   . [DISCONTINUED] diltiazem (CARDIZEM) 30 MG tablet Take 1 tablet (30 mg total) by mouth 2 (two) times daily.     Allergies:   Penicillins, Levaquin [levofloxacin], Niacin and related, Norvasc [amlodipine besylate], Novocain [procaine hcl], Pravachol [pravastatin sodium], Statins, Sulfa antibiotics, and Zetia [ezetimibe]   Social History   Socioeconomic History  . Marital status: Married    Spouse name: Not on file  . Number of children: Not on file  . Years of education: Not on file  . Highest education level: Not on file  Occupational History  . Not on file  Tobacco Use  . Smoking status: Never Smoker  . Smokeless tobacco: Never Used  Substance and Sexual Activity  . Alcohol use: No  . Drug use: No  . Sexual activity: Never  Other Topics Concern  . Not on file  Social History Narrative   Married   Retired Optometrist         Social Determinants of Radio broadcast assistant Strain:   . Difficulty of Paying Living Expenses: Not on file  Food Insecurity:   . Worried About Charity fundraiser in the Last Year: Not on file  . Ran Out of Food in the Last Year: Not on file  Transportation Needs:   . Lack of Transportation (Medical): Not on file  . Lack of Transportation (Non-Medical): Not on file  Physical  Activity:   . Days of Exercise per Week: Not on file  . Minutes of Exercise per Session: Not on file  Stress:   . Feeling of Stress : Not on file  Social Connections:   . Frequency of Communication with Friends and Family: Not on file  . Frequency of Social Gatherings with Friends and Family: Not on file  . Attends Religious Services: Not on file  . Active Member of Clubs or Organizations: Not on file  . Attends Archivist Meetings:  Not on file  . Marital Status: Not on file     Family History: The patient's family history includes Heart disease in her father and mother.  ROS:   Please see the history of present illness.    No complaints from the patient all other systems reviewed and are negative.  EKGs/Labs/Other Studies Reviewed:    The following studies were reviewed today:  ECHOCARDIOGRAM 07/11/2020: IMPRESSIONS  1. Prominant epicardial fat pad.  2. Left ventricular ejection fraction, by estimation, is 60 to 65%. The  left ventricle has normal function. The left ventricle has no regional  wall motion abnormalities. Left ventricular diastolic parameters are  indeterminate.  3. Right ventricular systolic function is normal. The right ventricular  size is normal. There is normal pulmonary artery systolic pressure.  4. Left atrial size was mildly dilated.  5. The mitral valve is degenerative. Mild mitral valve regurgitation. No  evidence of mitral stenosis.  6. The aortic valve is tricuspid. Aortic valve regurgitation is mild.  Mild to moderate aortic valve sclerosis/calcification is present, without  any evidence of aortic stenosis.  7. The inferior vena cava is normal in size with greater than 50%  respiratory variability, suggesting right atrial pressure of 3 mmHg.    EKG:  EKG left atrial abnormality, first-degree AV block at 212 ms, left axis deviation, poor R wave progression V1 through V5.  Recent Labs: 07/10/2020: ALT 16; BUN 22; Creatinine, Ser  1.23; Magnesium 1.9; Potassium 3.6; Sodium 143 07/12/2020: Hemoglobin 12.6; Platelets 180; TSH 1.107  Recent Lipid Panel    Component Value Date/Time   CHOL 199 07/12/2020 1303   TRIG 181 (H) 07/12/2020 1303   HDL 33 (L) 07/12/2020 1303   CHOLHDL 6.0 07/12/2020 1303   VLDL 36 07/12/2020 1303   LDLCALC 130 (H) 07/12/2020 1303    Physical Exam:    VS:  BP 124/68   Pulse (!) 56   Ht 5\' 2"  (1.575 m)   Wt 121 lb (54.9 kg)   SpO2 96%   BMI 22.13 kg/m     Wt Readings from Last 3 Encounters:  08/09/20 121 lb (54.9 kg)  07/24/20 119 lb 12.8 oz (54.3 kg)  07/12/20 121 lb 14.6 oz (55.3 kg)     GEN: Elderly, frail, with difficulty ambulating.. No acute distress HEENT: Normal NECK: No JVD. LYMPHATICS: No lymphadenopathy CARDIAC: 2/6 systolic murmur right upper sternal border.  No mitral regurgitation murmur.  RRR without  gallop, or edema. VASCULAR:  Normal Pulses. No bruits. RESPIRATORY:  Clear to auscultation without rales, wheezing or rhonchi  ABDOMEN: Soft, non-tender, non-distended, No pulsatile mass, MUSCULOSKELETAL: No deformity  SKIN: Warm and dry NEUROLOGIC:  Alert and oriented x 3 PSYCHIATRIC:  Normal affect   ASSESSMENT:    1. Paroxysmal atrial flutter ([place])   2. Chronic anticoagulation   3. Coronary artery disease involving native coronary artery of native heart without angina pectoris   4. Essential hypertension   5. S/P MVR (mitral valve repair)   6. Hyperlipidemia, unspecified hyperlipidemia type   7. Educated about COVID-19 virus infection   8. On amiodarone therapy    PLAN:    In order of problems listed above:  1. Atrial flutter now resolved.  2 weeks of amiodarone 200 mg twice daily has been administered.  We will plan one additional week of 200 mg twice daily and then decrease to 200 mg daily.  Discontinue diltiazem. 2. Continue apixaban 2.5 mg twice daily.  Monitor for bleeding. 3. Not discussed  4. Not discussed.  Blood pressure is excellent on  current regimen. 5. No clinical evidence of mitral regurgitation on exam. 6. Not being treated due to statin intolerance. 7. She has been vaccinated and having no current symptoms to suggest infection.  Social mitigation is being practiced. 8. TSH and liver panel on return in 6 weeks.  Goal will be to de-escalate amiodarone therapy settling on either 100 to 200 mg daily.  At her age, the plan is to continue amiodarone.  No consideration of ablation.   Medication Adjustments/Labs and Tests Ordered: Current medicines are reviewed at length with the patient today.  Concerns regarding medicines are outlined above.  Orders Placed This Encounter  Procedures  . EKG 12-Lead   No orders of the defined types were placed in this encounter.   Patient Instructions  Medication Instructions:  1) DISCONTINUE Diltiazem 2) On October 6th, DECREASE Amiodarone to 200mg  once daily  *If you need a refill on your cardiac medications before your next appointment, please call your pharmacy*   Lab Work: None If you have labs (blood work) drawn today and your tests are completely normal, you will receive your results only by: Marland Kitchen MyChart Message (if you have MyChart) OR . A paper copy in the mail If you have any lab test that is abnormal or we need to change your treatment, we will call you to review the results.   Testing/Procedures: None   Follow-Up: At [facility], you and your health needs are our priority.  As part of our continuing mission to provide you with exceptional heart care, we have created designated Provider Care Teams.  These Care Teams include your primary Cardiologist (physician) and Advanced Practice Providers (APPs -  Physician Assistants and Nurse Practitioners) who all work together to provide you with the care you need, when you need it.  We recommend signing up for the patient portal called "MyChart".  Sign up information is provided on this After Visit Summary.  MyChart is used  to connect with patients for Virtual Visits (Telemedicine).  Patients are able to view lab/test results, encounter notes, upcoming appointments, etc.  Non-urgent messages can be sent to your provider as well.   To learn more about what you can do with MyChart, go to [email or web address].    Your next appointment:   2 month(s)  The format for your next appointment:   In Person  Provider:   You may see Sinclair Grooms, MD or one of the following Advanced Practice Providers on your designated Care Team:    Truitt Merle, NP  Cecilie Kicks, NP  Kathyrn Drown, NP    Other Instructions      Signed, Sinclair Grooms, MD  08/09/2020 11:40 AM    [place]

## 2020-08-06 DIAGNOSIS — Z1159 Encounter for screening for other viral diseases: Secondary | ICD-10-CM | POA: Diagnosis not present

## 2020-08-06 DIAGNOSIS — R278 Other lack of coordination: Secondary | ICD-10-CM | POA: Diagnosis not present

## 2020-08-06 DIAGNOSIS — Z20828 Contact with and (suspected) exposure to other viral communicable diseases: Secondary | ICD-10-CM | POA: Diagnosis not present

## 2020-08-06 DIAGNOSIS — R296 Repeated falls: Secondary | ICD-10-CM | POA: Diagnosis not present

## 2020-08-06 DIAGNOSIS — R2681 Unsteadiness on feet: Secondary | ICD-10-CM | POA: Diagnosis not present

## 2020-08-06 DIAGNOSIS — M6281 Muscle weakness (generalized): Secondary | ICD-10-CM | POA: Diagnosis not present

## 2020-08-07 DIAGNOSIS — R2681 Unsteadiness on feet: Secondary | ICD-10-CM | POA: Diagnosis not present

## 2020-08-07 DIAGNOSIS — R278 Other lack of coordination: Secondary | ICD-10-CM | POA: Diagnosis not present

## 2020-08-07 DIAGNOSIS — M6281 Muscle weakness (generalized): Secondary | ICD-10-CM | POA: Diagnosis not present

## 2020-08-07 DIAGNOSIS — R296 Repeated falls: Secondary | ICD-10-CM | POA: Diagnosis not present

## 2020-08-08 DIAGNOSIS — M6281 Muscle weakness (generalized): Secondary | ICD-10-CM | POA: Diagnosis not present

## 2020-08-08 DIAGNOSIS — R278 Other lack of coordination: Secondary | ICD-10-CM | POA: Diagnosis not present

## 2020-08-08 DIAGNOSIS — R2681 Unsteadiness on feet: Secondary | ICD-10-CM | POA: Diagnosis not present

## 2020-08-08 DIAGNOSIS — R296 Repeated falls: Secondary | ICD-10-CM | POA: Diagnosis not present

## 2020-08-09 ENCOUNTER — Other Ambulatory Visit: Payer: Self-pay

## 2020-08-09 ENCOUNTER — Ambulatory Visit (INDEPENDENT_AMBULATORY_CARE_PROVIDER_SITE_OTHER): Payer: PPO | Admitting: Interventional Cardiology

## 2020-08-09 ENCOUNTER — Encounter: Payer: Self-pay | Admitting: Interventional Cardiology

## 2020-08-09 VITALS — BP 124/68 | HR 56 | Ht 62.0 in | Wt 121.0 lb

## 2020-08-09 DIAGNOSIS — Z79899 Other long term (current) drug therapy: Secondary | ICD-10-CM

## 2020-08-09 DIAGNOSIS — I10 Essential (primary) hypertension: Secondary | ICD-10-CM

## 2020-08-09 DIAGNOSIS — I2510 Atherosclerotic heart disease of native coronary artery without angina pectoris: Secondary | ICD-10-CM

## 2020-08-09 DIAGNOSIS — E785 Hyperlipidemia, unspecified: Secondary | ICD-10-CM | POA: Diagnosis not present

## 2020-08-09 DIAGNOSIS — I4892 Unspecified atrial flutter: Secondary | ICD-10-CM

## 2020-08-09 DIAGNOSIS — Z9889 Other specified postprocedural states: Secondary | ICD-10-CM

## 2020-08-09 DIAGNOSIS — Z7901 Long term (current) use of anticoagulants: Secondary | ICD-10-CM | POA: Diagnosis not present

## 2020-08-09 DIAGNOSIS — Z7189 Other specified counseling: Secondary | ICD-10-CM | POA: Diagnosis not present

## 2020-08-09 DIAGNOSIS — Z20828 Contact with and (suspected) exposure to other viral communicable diseases: Secondary | ICD-10-CM | POA: Diagnosis not present

## 2020-08-09 DIAGNOSIS — Z1159 Encounter for screening for other viral diseases: Secondary | ICD-10-CM | POA: Diagnosis not present

## 2020-08-09 NOTE — Patient Instructions (Signed)
Medication Instructions:  1) DISCONTINUE Diltiazem 2) On October 6th, DECREASE Amiodarone to 200mg  once daily  *If you need a refill on your cardiac medications before your next appointment, please call your pharmacy*   Lab Work: None If you have labs (blood work) drawn today and your tests are completely normal, you will receive your results only by: Marland Kitchen MyChart Message (if you have MyChart) OR . A paper copy in the mail If you have any lab test that is abnormal or we need to change your treatment, we will call you to review the results.   Testing/Procedures: None   Follow-Up: At [facility], you and your health needs are our priority.  As part of our continuing mission to provide you with exceptional heart care, we have created designated Provider Care Teams.  These Care Teams include your primary Cardiologist (physician) and Advanced Practice Providers (APPs -  Physician Assistants and Nurse Practitioners) who all work together to provide you with the care you need, when you need it.  We recommend signing up for the patient portal called "MyChart".  Sign up information is provided on this After Visit Summary.  MyChart is used to connect with patients for Virtual Visits (Telemedicine).  Patients are able to view lab/test results, encounter notes, upcoming appointments, etc.  Non-urgent messages can be sent to your provider as well.   To learn more about what you can do with MyChart, go to [email or web address].    Your next appointment:   2 month(s)  The format for your next appointment:   In Person  Provider:   You may see Sinclair Grooms, MD or one of the following Advanced Practice Providers on your designated Care Team:    Truitt Merle, NP  Cecilie Kicks, NP  Kathyrn Drown, NP    Other Instructions

## 2020-08-12 DIAGNOSIS — R278 Other lack of coordination: Secondary | ICD-10-CM | POA: Diagnosis not present

## 2020-08-12 DIAGNOSIS — M6281 Muscle weakness (generalized): Secondary | ICD-10-CM | POA: Diagnosis not present

## 2020-08-12 DIAGNOSIS — R296 Repeated falls: Secondary | ICD-10-CM | POA: Diagnosis not present

## 2020-08-12 DIAGNOSIS — R2681 Unsteadiness on feet: Secondary | ICD-10-CM | POA: Diagnosis not present

## 2020-08-13 DIAGNOSIS — Z20828 Contact with and (suspected) exposure to other viral communicable diseases: Secondary | ICD-10-CM | POA: Diagnosis not present

## 2020-08-13 DIAGNOSIS — Z1159 Encounter for screening for other viral diseases: Secondary | ICD-10-CM | POA: Diagnosis not present

## 2020-08-15 DIAGNOSIS — R296 Repeated falls: Secondary | ICD-10-CM | POA: Diagnosis not present

## 2020-08-15 DIAGNOSIS — R2681 Unsteadiness on feet: Secondary | ICD-10-CM | POA: Diagnosis not present

## 2020-08-15 DIAGNOSIS — M6281 Muscle weakness (generalized): Secondary | ICD-10-CM | POA: Diagnosis not present

## 2020-08-15 DIAGNOSIS — R278 Other lack of coordination: Secondary | ICD-10-CM | POA: Diagnosis not present

## 2020-08-16 ENCOUNTER — Emergency Department (HOSPITAL_COMMUNITY): Payer: PPO

## 2020-08-16 ENCOUNTER — Emergency Department (HOSPITAL_COMMUNITY)
Admission: EM | Admit: 2020-08-16 | Discharge: 2020-08-17 | Disposition: A | Discharge status: Home / Self Care | Payer: PPO | Attending: Emergency Medicine | Admitting: Emergency Medicine

## 2020-08-16 DIAGNOSIS — S0003XA Contusion of scalp, initial encounter: Secondary | ICD-10-CM

## 2020-08-16 DIAGNOSIS — W19XXXA Unspecified fall, initial encounter: Secondary | ICD-10-CM

## 2020-08-16 DIAGNOSIS — M4319 Spondylolisthesis, multiple sites in spine: Secondary | ICD-10-CM | POA: Diagnosis not present

## 2020-08-16 DIAGNOSIS — Z20828 Contact with and (suspected) exposure to other viral communicable diseases: Secondary | ICD-10-CM | POA: Diagnosis not present

## 2020-08-16 DIAGNOSIS — Z1159 Encounter for screening for other viral diseases: Secondary | ICD-10-CM | POA: Diagnosis not present

## 2020-08-16 DIAGNOSIS — S199XXA Unspecified injury of neck, initial encounter: Secondary | ICD-10-CM | POA: Diagnosis not present

## 2020-08-16 DIAGNOSIS — S0101XA Laceration without foreign body of scalp, initial encounter: Secondary | ICD-10-CM | POA: Diagnosis not present

## 2020-08-16 DIAGNOSIS — R278 Other lack of coordination: Secondary | ICD-10-CM | POA: Diagnosis not present

## 2020-08-16 DIAGNOSIS — S0291XA Unspecified fracture of skull, initial encounter for closed fracture: Secondary | ICD-10-CM | POA: Diagnosis not present

## 2020-08-16 DIAGNOSIS — M47812 Spondylosis without myelopathy or radiculopathy, cervical region: Secondary | ICD-10-CM | POA: Diagnosis not present

## 2020-08-16 DIAGNOSIS — M160 Bilateral primary osteoarthritis of hip: Secondary | ICD-10-CM | POA: Diagnosis not present

## 2020-08-16 DIAGNOSIS — I4891 Unspecified atrial fibrillation: Secondary | ICD-10-CM | POA: Diagnosis not present

## 2020-08-16 DIAGNOSIS — G9389 Other specified disorders of brain: Secondary | ICD-10-CM | POA: Diagnosis not present

## 2020-08-16 DIAGNOSIS — I7090 Unspecified atherosclerosis: Secondary | ICD-10-CM | POA: Diagnosis not present

## 2020-08-16 DIAGNOSIS — M6281 Muscle weakness (generalized): Secondary | ICD-10-CM | POA: Diagnosis not present

## 2020-08-16 DIAGNOSIS — R079 Chest pain, unspecified: Secondary | ICD-10-CM | POA: Diagnosis not present

## 2020-08-16 DIAGNOSIS — R2681 Unsteadiness on feet: Secondary | ICD-10-CM | POA: Diagnosis not present

## 2020-08-16 DIAGNOSIS — S0990XA Unspecified injury of head, initial encounter: Secondary | ICD-10-CM | POA: Diagnosis present

## 2020-08-16 DIAGNOSIS — I6529 Occlusion and stenosis of unspecified carotid artery: Secondary | ICD-10-CM | POA: Diagnosis not present

## 2020-08-16 DIAGNOSIS — E041 Nontoxic single thyroid nodule: Secondary | ICD-10-CM | POA: Diagnosis not present

## 2020-08-16 DIAGNOSIS — I10 Essential (primary) hypertension: Secondary | ICD-10-CM | POA: Diagnosis not present

## 2020-08-16 DIAGNOSIS — R296 Repeated falls: Secondary | ICD-10-CM | POA: Diagnosis not present

## 2020-08-16 DIAGNOSIS — S3993XA Unspecified injury of pelvis, initial encounter: Secondary | ICD-10-CM | POA: Diagnosis not present

## 2020-08-16 DIAGNOSIS — R58 Hemorrhage, not elsewhere classified: Secondary | ICD-10-CM | POA: Diagnosis not present

## 2020-08-16 LAB — CBC WITH DIFFERENTIAL/PLATELET
Abs Immature Granulocytes: 0.03 10*3/uL (ref 0.00–0.07)
Basophils Absolute: 0 10*3/uL (ref 0.0–0.1)
Basophils Relative: 0 %
Eosinophils Absolute: 0.1 10*3/uL (ref 0.0–0.5)
Eosinophils Relative: 2 %
HCT: 46.3 % — ABNORMAL HIGH (ref 36.0–46.0)
Hemoglobin: 14.9 g/dL (ref 12.0–15.0)
Immature Granulocytes: 1 %
Lymphocytes Relative: 27 %
Lymphs Abs: 1.8 10*3/uL (ref 0.7–4.0)
MCH: 28.5 pg (ref 26.0–34.0)
MCHC: 32.2 g/dL (ref 30.0–36.0)
MCV: 88.7 fL (ref 80.0–100.0)
Monocytes Absolute: 0.6 10*3/uL (ref 0.1–1.0)
Monocytes Relative: 9 %
Neutro Abs: 4.1 10*3/uL (ref 1.7–7.7)
Neutrophils Relative %: 61 %
Platelets: 154 10*3/uL (ref 150–400)
RBC: 5.22 MIL/uL — ABNORMAL HIGH (ref 3.87–5.11)
RDW: 14.1 % (ref 11.5–15.5)
WBC: 6.6 10*3/uL (ref 4.0–10.5)
nRBC: 0 % (ref 0.0–0.2)

## 2020-08-16 LAB — URINALYSIS, ROUTINE W REFLEX MICROSCOPIC
Bacteria, UA: NONE SEEN
Bilirubin Urine: NEGATIVE
Glucose, UA: NEGATIVE mg/dL
Hgb urine dipstick: NEGATIVE
Ketones, ur: NEGATIVE mg/dL
Nitrite: NEGATIVE
Protein, ur: NEGATIVE mg/dL
Specific Gravity, Urine: 1.015 (ref 1.005–1.030)
pH: 6 (ref 5.0–8.0)

## 2020-08-16 LAB — BASIC METABOLIC PANEL
Anion gap: 11 (ref 5–15)
BUN: 20 mg/dL (ref 8–23)
CO2: 30 mmol/L (ref 22–32)
Calcium: 9.2 mg/dL (ref 8.9–10.3)
Chloride: 101 mmol/L (ref 98–111)
Creatinine, Ser: 1.31 mg/dL — ABNORMAL HIGH (ref 0.44–1.00)
GFR, Estimated: 37 mL/min — ABNORMAL LOW (ref >60)
Glucose, Bld: 111 mg/dL — ABNORMAL HIGH (ref 70–99)
Potassium: 3.4 mmol/L — ABNORMAL LOW (ref 3.5–5.1)
Sodium: 142 mmol/L (ref 135–145)

## 2020-08-16 LAB — PROTIME-INR
INR: 1.2 (ref 0.8–1.2)
Prothrombin Time: 15.1 seconds (ref 11.4–15.2)

## 2020-08-16 NOTE — ED Notes (Signed)
Patient repositioned on bed , adult diaper applied , warm blankets provided , respirations unlabored.

## 2020-08-16 NOTE — ED Notes (Signed)
Report given to [facility] charge nurse , [facility] notified by Network engineer for transport.

## 2020-08-16 NOTE — ED Notes (Signed)
Patient transported to CT 

## 2020-08-16 NOTE — ED Provider Notes (Signed)
[place] EMERGENCY DEPARTMENT Provider Note   CSN: [id number] Arrival date & time: 08/16/20  1651     History Chief Complaint  Patient presents with  . Fall    Marie Burch is a 86 y.o. female.  Presented to ER with concern for fall.  Patient from [facility], report unwitnessed fall.  Patient denied any loss of consciousness, has no ongoing pain.  Has a history of atrial fibrillation, takes Eliquis.  History limited due to acuity.  Level 2 trauma due to fall and head trauma on blood thinners.  HPI     No past medical history on file.  There are no problems to display for this patient.   OB History   No obstetric history on file.     No family history on file.  Social History   Tobacco Use  . Smoking status: Not on file  Substance Use Topics  . Alcohol use: Not on file  . Drug use: Not on file    Home Medications Prior to Admission medications   Not on File    Allergies    Patient has no known allergies.  Review of Systems   Review of Systems  Unable to perform ROS: Acuity of condition    Physical Exam Updated Vital Signs BP (!) 163/81   Pulse 61   Temp (!) 96.1 F (35.6 C) (Temporal)   Resp (!) 21   Ht 5' (1.524 m)   Wt 54.9 kg   SpO2 98%   BMI 23.63 kg/m   Physical Exam Vitals and nursing note reviewed.  Constitutional:      General: She is not in acute distress.    Appearance: She is well-developed.  HENT:     Head: Normocephalic.     Comments: 3 cm diameter hematoma over right posterior occiput, no active bleeding, no significant laceration Eyes:     Conjunctiva/sclera: Conjunctivae normal.  Cardiovascular:     Rate and Rhythm: Normal rate.     Pulses: Normal pulses.     Heart sounds: No murmur heard.   Pulmonary:     Effort: Pulmonary effort is normal. No respiratory distress.     Breath sounds: Normal breath sounds.  Abdominal:     Palpations: Abdomen is soft.     Tenderness: There is no abdominal  tenderness.  Musculoskeletal:     Cervical back: Neck supple.     Comments: Back: no C, T, L spine TTP, no step off or deformity RUE: no TTP throughout, no deformity, normal joint ROM, radial pulse intact, distal sensation and motor intact LUE: no TTP throughout, no deformity, normal joint ROM, radial pulse intact, distal sensation and motor intact RLE:  no TTP throughout, no deformity, normal joint ROM, distal pulse, sensation and motor intact LLE: no TTP throughout, no deformity, normal joint ROM, distal pulse, sensation and motor intact  Skin:    General: Skin is warm and dry.  Neurological:     General: No focal deficit present.     Mental Status: She is alert and oriented to person, place, and time.  Psychiatric:        Mood and Affect: Mood normal.     ED Results / Procedures / Treatments   Labs (all labs ordered are listed, but only abnormal results are displayed) Labs Reviewed  CBC WITH DIFFERENTIAL/PLATELET - Abnormal; Notable for the following components:      Result Value   RBC 5.22 (*)    HCT 46.3 (*)  All other components within normal limits  BASIC METABOLIC PANEL - Abnormal; Notable for the following components:   Potassium 3.4 (*)    Glucose, Bld 111 (*)    Creatinine, Ser 1.31 (*)    GFR, Estimated 37 (*)    All other components within normal limits  URINALYSIS, ROUTINE W REFLEX MICROSCOPIC - Abnormal; Notable for the following components:   Leukocytes,Ua LARGE (*)    All other components within normal limits  PROTIME-INR    EKG EKG Interpretation  Date/Time:  Friday August 16 2020 17:05:58 EDT Ventricular Rate:  61 PR Interval:    QRS Duration: 133 QT Interval:  479 QTC Calculation: 483 R Axis:   -76 Text Interpretation: Sinus rhythm Left bundle branch block Confirmed by Madalyn Rob [phone]) on 08/16/2020 7:38:42 PM   Radiology CT Head Wo Contrast  Result Date: 08/16/2020 CLINICAL DATA:  Unwitnessed fall, posterior hematoma, on  anticoagulation EXAM: CT HEAD WITHOUT CONTRAST CT CERVICAL SPINE WITHOUT CONTRAST TECHNIQUE: Multidetector CT imaging of the head and cervical spine was performed following the standard protocol without intravenous contrast. Multiplanar CT image reconstructions of the cervical spine were also generated. COMPARISON:  CT head 02/17/2020, CT head and cervical spine 10/30/2016, MRI 02/15/2015 FINDINGS: CT HEAD FINDINGS Brain: No evidence of acute infarction, hemorrhage, hydrocephalus, extra-axial collection, visible mass lesion or mass effect. Symmetric prominence of the ventricles, cisterns and sulci compatible with parenchymal volume loss. Patchy areas of white matter hypoattenuation are most compatible with chronic microvascular angiopathy. Stable benign senescent mineralization in the right basal ganglia. Basal cisterns are patent. Partially empty appearance of the sella is unchanged from comparison. Remaining midline intracranial structures are unremarkable. Cerebellar tonsils are normally positioned. Vascular: Atherosclerotic calcification of the carotid siphons. No hyperdense vessel. Skull: Right frontoparietal scalp swelling and hematoma with overlying laceration. Crescentic hematoma measures up to 14 mm in maximal thickness. Overlying bandaging material is present. No subjacent calvarial fracture or acute osseous injuries are identified. Sinuses/Orbits: Paranasal sinuses and mastoid air cells are predominantly clear. Orbital structures are unremarkable aside from prior lens extractions. Other: None CT CERVICAL SPINE FINDINGS Alignment: Exaggeration of the cervical lordosis. Mild rightward cranial rotation. Minimal anterolisthesis C7 on T1 is unchanged from comparison. No evidence of traumatic listhesis. No abnormally widened, perched or jumped facets. Normal alignment of the craniocervical and atlantoaxial articulations accounting for cranial rotation. Levocurvature of the upper thoracic spine is noted on  coronal imaging. Skull base and vertebrae: The osseous structures appear diffusely demineralized which may limit detection of small or nondisplaced fractures. No discernible acute skull base or vertebral fracture is identified. Multilevel Schmorl's node formations are present. Stable appearance of a bone island in C6. Moderate arthrosis at the atlantodental interval with calcific pannus formation, nonspecific but can be seen with CPPD or rheumatoid arthropathy. Additional multilevel spondylitic changes as detailed below. Soft tissues and spinal canal: Calcific pannus formation, as above. No pre or paravertebral fluid or swelling. No visible canal hematoma. Disc levels: Multilevel intervertebral disc height loss with spondylitic endplate changes. Slightly larger disc osteophyte complexes present C4-5 C6-7 likely result in some mild central canal stenosis. Multilevel uncinate spurring and facet hypertrophic changes are present as well resulting and mild to moderate multilevel foraminal narrowing most pronounced on the right C3-4, C4-5. Upper chest: No acute abnormality in the upper chest or imaged lung apices. Biapical pleuroparenchymal scarring. Other: Calcifications in the proximal great vessels and cervical carotids. Subcentimeter hypoattenuating nodule in the right lobe thyroid gland. No follow-up imaging is recommended.  Reference: J Am Coll Radiol. 2015 Feb;12(2): 143-50. IMPRESSION: 1. Right frontoparietal scalp swelling and hematoma with overlying laceration. Crescentic hematoma measures up to 14 mm in maximal thickness. No subjacent calvarial fracture or acute osseous injuries are identified. 2. No evidence of acute intracranial abnormality. Stable parenchymal volume loss and chronic microvascular angiopathy. 3. No evidence of acute fracture or subluxation of the cervical spine. 4. Multilevel degenerative changes of the cervical spine as described. 5. Calcific pannus formation, nonspecific but can be seen with  CPPD or rheumatoid arthropathy. 6. Intracranial and cervical atherosclerosis. Electronically Signed   By: Lovena Le M.D.   On: 08/16/2020 18:34   CT Cervical Spine Wo Contrast  Result Date: 08/16/2020 CLINICAL DATA:  Unwitnessed fall, posterior hematoma, on anticoagulation EXAM: CT HEAD WITHOUT CONTRAST CT CERVICAL SPINE WITHOUT CONTRAST TECHNIQUE: Multidetector CT imaging of the head and cervical spine was performed following the standard protocol without intravenous contrast. Multiplanar CT image reconstructions of the cervical spine were also generated. COMPARISON:  CT head 02/17/2020, CT head and cervical spine 10/30/2016, MRI 02/15/2015 FINDINGS: CT HEAD FINDINGS Brain: No evidence of acute infarction, hemorrhage, hydrocephalus, extra-axial collection, visible mass lesion or mass effect. Symmetric prominence of the ventricles, cisterns and sulci compatible with parenchymal volume loss. Patchy areas of white matter hypoattenuation are most compatible with chronic microvascular angiopathy. Stable benign senescent mineralization in the right basal ganglia. Basal cisterns are patent. Partially empty appearance of the sella is unchanged from comparison. Remaining midline intracranial structures are unremarkable. Cerebellar tonsils are normally positioned. Vascular: Atherosclerotic calcification of the carotid siphons. No hyperdense vessel. Skull: Right frontoparietal scalp swelling and hematoma with overlying laceration. Crescentic hematoma measures up to 14 mm in maximal thickness. Overlying bandaging material is present. No subjacent calvarial fracture or acute osseous injuries are identified. Sinuses/Orbits: Paranasal sinuses and mastoid air cells are predominantly clear. Orbital structures are unremarkable aside from prior lens extractions. Other: None CT CERVICAL SPINE FINDINGS Alignment: Exaggeration of the cervical lordosis. Mild rightward cranial rotation. Minimal anterolisthesis C7 on T1 is unchanged  from comparison. No evidence of traumatic listhesis. No abnormally widened, perched or jumped facets. Normal alignment of the craniocervical and atlantoaxial articulations accounting for cranial rotation. Levocurvature of the upper thoracic spine is noted on coronal imaging. Skull base and vertebrae: The osseous structures appear diffusely demineralized which may limit detection of small or nondisplaced fractures. No discernible acute skull base or vertebral fracture is identified. Multilevel Schmorl's node formations are present. Stable appearance of a bone island in C6. Moderate arthrosis at the atlantodental interval with calcific pannus formation, nonspecific but can be seen with CPPD or rheumatoid arthropathy. Additional multilevel spondylitic changes as detailed below. Soft tissues and spinal canal: Calcific pannus formation, as above. No pre or paravertebral fluid or swelling. No visible canal hematoma. Disc levels: Multilevel intervertebral disc height loss with spondylitic endplate changes. Slightly larger disc osteophyte complexes present C4-5 C6-7 likely result in some mild central canal stenosis. Multilevel uncinate spurring and facet hypertrophic changes are present as well resulting and mild to moderate multilevel foraminal narrowing most pronounced on the right C3-4, C4-5. Upper chest: No acute abnormality in the upper chest or imaged lung apices. Biapical pleuroparenchymal scarring. Other: Calcifications in the proximal great vessels and cervical carotids. Subcentimeter hypoattenuating nodule in the right lobe thyroid gland. No follow-up imaging is recommended. Reference: J Am Coll Radiol. 2015 Feb;12(2): 143-50. IMPRESSION: 1. Right frontoparietal scalp swelling and hematoma with overlying laceration. Crescentic hematoma measures up to 14 mm in maximal  thickness. No subjacent calvarial fracture or acute osseous injuries are identified. 2. No evidence of acute intracranial abnormality. Stable  parenchymal volume loss and chronic microvascular angiopathy. 3. No evidence of acute fracture or subluxation of the cervical spine. 4. Multilevel degenerative changes of the cervical spine as described. 5. Calcific pannus formation, nonspecific but can be seen with CPPD or rheumatoid arthropathy. 6. Intracranial and cervical atherosclerosis. Electronically Signed   By: Lovena Le M.D.   On: 08/16/2020 18:34   DG Pelvis Portable  Result Date: 08/16/2020 CLINICAL DATA:  Trauma, fall EXAM: PORTABLE PELVIS 1-2 VIEWS COMPARISON:  None. FINDINGS: SI joints are non widened. Pubic symphysis and rami are intact. Both femoral heads project in joint. No acute displaced fracture or malalignment. Mild degenerative changes of both hips. IMPRESSION: No acute osseous abnormality. Electronically Signed   By: Donavan Foil M.D.   On: 08/16/2020 17:36   DG Chest Portable 1 View  Result Date: 08/16/2020 CLINICAL DATA:  Chest pain after falling. EXAM: PORTABLE CHEST 1 VIEW COMPARISON:  Radiographs 07/10/2020 and 02/05/2014. FINDINGS: Stable heart size and mediastinal contours post median sternotomy and CABG. There is aortic atherosclerosis. The lungs appear stable. There is no edema, confluent airspace opacity, pleural effusion or pneumothorax. Thoracic scoliosis and bilateral glenohumeral degenerative changes are noted. IMPRESSION: No acute cardiopulmonary process. Stable postoperative chest. Electronically Signed   By: Richardean Sale M.D.   On: 08/16/2020 17:35    Procedures Procedures (including critical care time)  Medications Ordered in ED Medications - No data to display  ED Course  I have reviewed the triage vital signs and the nursing notes.  Pertinent labs & imaging results that were available during my care of the patient were reviewed by me and considered in my medical decision making (see chart for details).    MDM Rules/Calculators/A&P                         86 year old lady with history of A.  Fib on Eliquis presents to ER after fall, head trauma.  On physical exam, only notable traumatic finding was scalp hematoma, superficial abrasion without significant laceration.  Basic labs were within normal limits.  CT head negative for acute intracranial pathology.  Will discharge patient back to facility.  After the discussed management above, the patient was determined to be safe for discharge.  The patient was in agreement with this plan and all questions regarding their care were answered.  ED return precautions were discussed and the patient will return to the ED with any significant worsening of condition.     Final Clinical Impression(s) / ED Diagnoses Final diagnoses:  Fall, initial encounter  Hematoma of scalp, initial encounter    Rx / DC Orders ED Discharge Orders    None       Lucrezia Starch, MD 08/17/20 0004

## 2020-08-16 NOTE — ED Notes (Signed)
[facility] called for transport.  

## 2020-08-16 NOTE — ED Notes (Signed)
Pt attempting to get out of bed confused and disoriented  Managed to get the pt to lie back down  Pulled back up in the bed

## 2020-08-16 NOTE — Discharge Instructions (Signed)
Follow-up with your primary doctor regarding your fall today.  If you have any additional falls, any increased confusion, vomiting, chest pain or difficulty in breathing or other new concern symptom, return to ER for reassessment

## 2020-08-16 NOTE — ED Triage Notes (Signed)
Pt here from [facility] after unwitnessed fall heard by staff. No LOC. Hematoma to back of head, denies pain. On Eliquis.

## 2020-08-16 NOTE — ED Notes (Signed)
The pts iv fluid has infused pt alert family at  The bedside

## 2020-08-19 DIAGNOSIS — R2681 Unsteadiness on feet: Secondary | ICD-10-CM | POA: Diagnosis not present

## 2020-08-19 DIAGNOSIS — R296 Repeated falls: Secondary | ICD-10-CM | POA: Diagnosis not present

## 2020-08-19 DIAGNOSIS — M6281 Muscle weakness (generalized): Secondary | ICD-10-CM | POA: Diagnosis not present

## 2020-08-19 DIAGNOSIS — R278 Other lack of coordination: Secondary | ICD-10-CM | POA: Diagnosis not present

## 2020-08-20 DIAGNOSIS — R296 Repeated falls: Secondary | ICD-10-CM | POA: Diagnosis not present

## 2020-08-20 DIAGNOSIS — M6281 Muscle weakness (generalized): Secondary | ICD-10-CM | POA: Diagnosis not present

## 2020-08-20 DIAGNOSIS — R2681 Unsteadiness on feet: Secondary | ICD-10-CM | POA: Diagnosis not present

## 2020-08-20 DIAGNOSIS — R278 Other lack of coordination: Secondary | ICD-10-CM | POA: Diagnosis not present

## 2020-08-22 DIAGNOSIS — R278 Other lack of coordination: Secondary | ICD-10-CM | POA: Diagnosis not present

## 2020-08-22 DIAGNOSIS — R296 Repeated falls: Secondary | ICD-10-CM | POA: Diagnosis not present

## 2020-08-22 DIAGNOSIS — R2681 Unsteadiness on feet: Secondary | ICD-10-CM | POA: Diagnosis not present

## 2020-08-22 DIAGNOSIS — M6281 Muscle weakness (generalized): Secondary | ICD-10-CM | POA: Diagnosis not present

## 2020-08-23 DIAGNOSIS — R278 Other lack of coordination: Secondary | ICD-10-CM | POA: Diagnosis not present

## 2020-08-23 DIAGNOSIS — Z1159 Encounter for screening for other viral diseases: Secondary | ICD-10-CM | POA: Diagnosis not present

## 2020-08-23 DIAGNOSIS — R2681 Unsteadiness on feet: Secondary | ICD-10-CM | POA: Diagnosis not present

## 2020-08-23 DIAGNOSIS — M6281 Muscle weakness (generalized): Secondary | ICD-10-CM | POA: Diagnosis not present

## 2020-08-23 DIAGNOSIS — R296 Repeated falls: Secondary | ICD-10-CM | POA: Diagnosis not present

## 2020-08-23 DIAGNOSIS — Z20828 Contact with and (suspected) exposure to other viral communicable diseases: Secondary | ICD-10-CM | POA: Diagnosis not present

## 2020-08-26 DIAGNOSIS — R278 Other lack of coordination: Secondary | ICD-10-CM | POA: Diagnosis not present

## 2020-08-26 DIAGNOSIS — R2681 Unsteadiness on feet: Secondary | ICD-10-CM | POA: Diagnosis not present

## 2020-08-26 DIAGNOSIS — R296 Repeated falls: Secondary | ICD-10-CM | POA: Diagnosis not present

## 2020-08-26 DIAGNOSIS — M6281 Muscle weakness (generalized): Secondary | ICD-10-CM | POA: Diagnosis not present

## 2020-08-27 ENCOUNTER — Emergency Department (HOSPITAL_COMMUNITY): Payer: PPO

## 2020-08-27 ENCOUNTER — Inpatient Hospital Stay (HOSPITAL_COMMUNITY)
Admission: EM | Admit: 2020-08-27 | Discharge: 2020-08-31 | DRG: 083 | Disposition: A | Discharge status: Skilled Nursing Facility | Payer: PPO | Source: Skilled Nursing Facility | Attending: Family Medicine | Admitting: Family Medicine

## 2020-08-27 ENCOUNTER — Encounter (HOSPITAL_COMMUNITY): Payer: Self-pay | Admitting: Emergency Medicine

## 2020-08-27 DIAGNOSIS — Z66 Do not resuscitate: Secondary | ICD-10-CM | POA: Diagnosis present

## 2020-08-27 DIAGNOSIS — Y92128 Other place in nursing home as the place of occurrence of the external cause: Secondary | ICD-10-CM

## 2020-08-27 DIAGNOSIS — I2510 Atherosclerotic heart disease of native coronary artery without angina pectoris: Secondary | ICD-10-CM | POA: Diagnosis present

## 2020-08-27 DIAGNOSIS — F0281 Dementia in other diseases classified elsewhere with behavioral disturbance: Secondary | ICD-10-CM | POA: Diagnosis not present

## 2020-08-27 DIAGNOSIS — Z923 Personal history of irradiation: Secondary | ICD-10-CM

## 2020-08-27 DIAGNOSIS — S065X9A Traumatic subdural hemorrhage with loss of consciousness of unspecified duration, initial encounter: Secondary | ICD-10-CM | POA: Diagnosis not present

## 2020-08-27 DIAGNOSIS — K219 Gastro-esophageal reflux disease without esophagitis: Secondary | ICD-10-CM | POA: Diagnosis present

## 2020-08-27 DIAGNOSIS — F32A Depression, unspecified: Secondary | ICD-10-CM | POA: Diagnosis not present

## 2020-08-27 DIAGNOSIS — Z85038 Personal history of other malignant neoplasm of large intestine: Secondary | ICD-10-CM | POA: Diagnosis not present

## 2020-08-27 DIAGNOSIS — S065X0A Traumatic subdural hemorrhage without loss of consciousness, initial encounter: Secondary | ICD-10-CM | POA: Diagnosis not present

## 2020-08-27 DIAGNOSIS — W1830XA Fall on same level, unspecified, initial encounter: Secondary | ICD-10-CM | POA: Diagnosis present

## 2020-08-27 DIAGNOSIS — Z951 Presence of aortocoronary bypass graft: Secondary | ICD-10-CM

## 2020-08-27 DIAGNOSIS — I517 Cardiomegaly: Secondary | ICD-10-CM | POA: Diagnosis not present

## 2020-08-27 DIAGNOSIS — Z20822 Contact with and (suspected) exposure to covid-19: Secondary | ICD-10-CM | POA: Diagnosis present

## 2020-08-27 DIAGNOSIS — I10 Essential (primary) hypertension: Secondary | ICD-10-CM | POA: Diagnosis not present

## 2020-08-27 DIAGNOSIS — Z9889 Other specified postprocedural states: Secondary | ICD-10-CM | POA: Diagnosis not present

## 2020-08-27 DIAGNOSIS — R220 Localized swelling, mass and lump, head: Secondary | ICD-10-CM | POA: Diagnosis not present

## 2020-08-27 DIAGNOSIS — F0391 Unspecified dementia with behavioral disturbance: Secondary | ICD-10-CM | POA: Diagnosis present

## 2020-08-27 DIAGNOSIS — Z90710 Acquired absence of both cervix and uterus: Secondary | ICD-10-CM

## 2020-08-27 DIAGNOSIS — E041 Nontoxic single thyroid nodule: Secondary | ICD-10-CM | POA: Diagnosis not present

## 2020-08-27 DIAGNOSIS — S51811A Laceration without foreign body of right forearm, initial encounter: Secondary | ICD-10-CM | POA: Diagnosis not present

## 2020-08-27 DIAGNOSIS — Z9049 Acquired absence of other specified parts of digestive tract: Secondary | ICD-10-CM

## 2020-08-27 DIAGNOSIS — Z79899 Other long term (current) drug therapy: Secondary | ICD-10-CM | POA: Diagnosis not present

## 2020-08-27 DIAGNOSIS — Z905 Acquired absence of kidney: Secondary | ICD-10-CM | POA: Diagnosis not present

## 2020-08-27 DIAGNOSIS — E782 Mixed hyperlipidemia: Secondary | ICD-10-CM | POA: Diagnosis present

## 2020-08-27 DIAGNOSIS — R102 Pelvic and perineal pain: Secondary | ICD-10-CM | POA: Diagnosis not present

## 2020-08-27 DIAGNOSIS — F0390 Unspecified dementia without behavioral disturbance: Secondary | ICD-10-CM | POA: Diagnosis not present

## 2020-08-27 DIAGNOSIS — W19XXXA Unspecified fall, initial encounter: Secondary | ICD-10-CM | POA: Diagnosis not present

## 2020-08-27 DIAGNOSIS — Z515 Encounter for palliative care: Secondary | ICD-10-CM | POA: Diagnosis not present

## 2020-08-27 DIAGNOSIS — R0902 Hypoxemia: Secondary | ICD-10-CM | POA: Diagnosis not present

## 2020-08-27 DIAGNOSIS — Z7189 Other specified counseling: Secondary | ICD-10-CM | POA: Diagnosis not present

## 2020-08-27 DIAGNOSIS — R296 Repeated falls: Secondary | ICD-10-CM | POA: Diagnosis not present

## 2020-08-27 DIAGNOSIS — R4182 Altered mental status, unspecified: Secondary | ICD-10-CM | POA: Diagnosis not present

## 2020-08-27 DIAGNOSIS — I480 Paroxysmal atrial fibrillation: Secondary | ICD-10-CM | POA: Diagnosis present

## 2020-08-27 DIAGNOSIS — Z7983 Long term (current) use of bisphosphonates: Secondary | ICD-10-CM | POA: Diagnosis not present

## 2020-08-27 DIAGNOSIS — F03918 Unspecified dementia, unspecified severity, with other behavioral disturbance: Secondary | ICD-10-CM | POA: Diagnosis present

## 2020-08-27 DIAGNOSIS — S199XXA Unspecified injury of neck, initial encounter: Secondary | ICD-10-CM | POA: Diagnosis not present

## 2020-08-27 DIAGNOSIS — I700 Atherosclerosis of aorta: Secondary | ICD-10-CM | POA: Diagnosis not present

## 2020-08-27 DIAGNOSIS — E785 Hyperlipidemia, unspecified: Secondary | ICD-10-CM | POA: Diagnosis not present

## 2020-08-27 DIAGNOSIS — Z7901 Long term (current) use of anticoagulants: Secondary | ICD-10-CM | POA: Diagnosis not present

## 2020-08-27 DIAGNOSIS — M47816 Spondylosis without myelopathy or radiculopathy, lumbar region: Secondary | ICD-10-CM | POA: Diagnosis not present

## 2020-08-27 DIAGNOSIS — I6200 Nontraumatic subdural hemorrhage, unspecified: Secondary | ICD-10-CM | POA: Diagnosis not present

## 2020-08-27 DIAGNOSIS — M25559 Pain in unspecified hip: Secondary | ICD-10-CM | POA: Diagnosis not present

## 2020-08-27 DIAGNOSIS — I6201 Nontraumatic acute subdural hemorrhage: Secondary | ICD-10-CM | POA: Diagnosis not present

## 2020-08-27 DIAGNOSIS — M2550 Pain in unspecified joint: Secondary | ICD-10-CM | POA: Diagnosis not present

## 2020-08-27 DIAGNOSIS — R278 Other lack of coordination: Secondary | ICD-10-CM | POA: Diagnosis not present

## 2020-08-27 DIAGNOSIS — E860 Dehydration: Secondary | ICD-10-CM | POA: Diagnosis not present

## 2020-08-27 DIAGNOSIS — I4892 Unspecified atrial flutter: Secondary | ICD-10-CM | POA: Diagnosis present

## 2020-08-27 DIAGNOSIS — Z7401 Bed confinement status: Secondary | ICD-10-CM | POA: Diagnosis not present

## 2020-08-27 DIAGNOSIS — S065XAA Traumatic subdural hemorrhage with loss of consciousness status unknown, initial encounter: Secondary | ICD-10-CM | POA: Diagnosis present

## 2020-08-27 DIAGNOSIS — G9389 Other specified disorders of brain: Secondary | ICD-10-CM | POA: Diagnosis not present

## 2020-08-27 DIAGNOSIS — R079 Chest pain, unspecified: Secondary | ICD-10-CM | POA: Diagnosis not present

## 2020-08-27 DIAGNOSIS — R2681 Unsteadiness on feet: Secondary | ICD-10-CM | POA: Diagnosis not present

## 2020-08-27 DIAGNOSIS — S065X0D Traumatic subdural hemorrhage without loss of consciousness, subsequent encounter: Secondary | ICD-10-CM | POA: Diagnosis not present

## 2020-08-27 DIAGNOSIS — I6203 Nontraumatic chronic subdural hemorrhage: Secondary | ICD-10-CM | POA: Diagnosis not present

## 2020-08-27 DIAGNOSIS — E876 Hypokalemia: Secondary | ICD-10-CM | POA: Diagnosis not present

## 2020-08-27 DIAGNOSIS — M6281 Muscle weakness (generalized): Secondary | ICD-10-CM | POA: Diagnosis not present

## 2020-08-27 DIAGNOSIS — F5104 Psychophysiologic insomnia: Secondary | ICD-10-CM | POA: Diagnosis not present

## 2020-08-27 LAB — CBC WITH DIFFERENTIAL/PLATELET
Abs Immature Granulocytes: 0.11 10*3/uL — ABNORMAL HIGH (ref 0.00–0.07)
Basophils Absolute: 0 10*3/uL (ref 0.0–0.1)
Basophils Relative: 0 %
Eosinophils Absolute: 0.2 10*3/uL (ref 0.0–0.5)
Eosinophils Relative: 2 %
HCT: 47.2 % — ABNORMAL HIGH (ref 36.0–46.0)
Hemoglobin: 15.3 g/dL — ABNORMAL HIGH (ref 12.0–15.0)
Immature Granulocytes: 1 %
Lymphocytes Relative: 20 %
Lymphs Abs: 1.7 10*3/uL (ref 0.7–4.0)
MCH: 28 pg (ref 26.0–34.0)
MCHC: 32.4 g/dL (ref 30.0–36.0)
MCV: 86.4 fL (ref 80.0–100.0)
Monocytes Absolute: 0.8 10*3/uL (ref 0.1–1.0)
Monocytes Relative: 9 %
Neutro Abs: 5.6 10*3/uL (ref 1.7–7.7)
Neutrophils Relative %: 68 %
Platelets: 197 10*3/uL (ref 150–400)
RBC: 5.46 MIL/uL — ABNORMAL HIGH (ref 3.87–5.11)
RDW: 14 % (ref 11.5–15.5)
WBC: 8.4 10*3/uL (ref 4.0–10.5)
nRBC: 0 % (ref 0.0–0.2)

## 2020-08-27 LAB — BASIC METABOLIC PANEL
Anion gap: 13 (ref 5–15)
BUN: 18 mg/dL (ref 8–23)
CO2: 27 mmol/L (ref 22–32)
Calcium: 9.3 mg/dL (ref 8.9–10.3)
Chloride: 96 mmol/L — ABNORMAL LOW (ref 98–111)
Creatinine, Ser: 1.26 mg/dL — ABNORMAL HIGH (ref 0.44–1.00)
GFR, Estimated: 39 mL/min — ABNORMAL LOW (ref >60)
Glucose, Bld: 140 mg/dL — ABNORMAL HIGH (ref 70–99)
Potassium: 3 mmol/L — ABNORMAL LOW (ref 3.5–5.1)
Sodium: 136 mmol/L (ref 135–145)

## 2020-08-27 LAB — RESPIRATORY PANEL BY RT PCR (FLU A&B, COVID)
Influenza A by PCR: NEGATIVE
Influenza B by PCR: NEGATIVE
SARS Coronavirus 2 by RT PCR: NEGATIVE

## 2020-08-27 LAB — CK: Total CK: 49 U/L (ref 38–234)

## 2020-08-27 MED ORDER — PROTHROMBIN COMPLEX CONC HUMAN 500 UNITS IV KIT
2586.0000 [IU] | PACK | Status: AC
  Administered 2020-08-27: 2586 [IU] via INTRAVENOUS
  Filled 2020-08-27: qty 2000
  Filled 2020-08-27: qty 2586

## 2020-08-27 MED ORDER — POTASSIUM CHLORIDE 10 MEQ/100ML IV SOLN
10.0000 meq | Freq: Once | INTRAVENOUS | Status: AC
  Administered 2020-08-28: 10 meq via INTRAVENOUS
  Filled 2020-08-27: qty 100

## 2020-08-27 NOTE — ED Provider Notes (Signed)
[facility] Provider Note   CSN: [id number] Arrival date & time: 08/27/20  2014     History Chief Complaint  Patient presents with  . Fall    Level2    Marie Burch is an 86 y.o. female who presentsfor unwitnessed fall.  She has a history of dementia.  She was found outside in the woods.  She was down for an unknown amount of time.  She has no complaints at this time.  He was placed in a c-collar by EMS.  She has multiple old appearing bruises and cuts and one small new appearing skin tear on the right forearm.  She denies any other complaints at this time.  She is chronically anticoagulated on Eliquis.  She is at her baseline per EMS  HPI     Past Medical History:  Diagnosis Date  . Anxiety   . Arthritis   . Blood transfusion 1960's   During Nephrectomy Procedure  . Cancer [facility]) 2001 and 2002   breast, right  . Cancer of colon ([place]) 08/03/12  . Chronic kidney disease    history of right nephrectomy due to stone '60's  . Complication of anesthesia    "I quit breathing" (patient reported it was due to PCN allergy though '60's)  . Coronary artery disease   . Depression   . GERD (gastroesophageal reflux disease)   . Heart murmur    s/p MV ring annuloplasty '03  . Hyperlipidemia   . Hypertension    dr Daneen Schick  . S/P radiation therapy 11/29/01 - 03/18/02   Right Breast: 5,040 cGy/28 Fractions with Boost for Total Dose  of 6,3000 cGy    Patient Active Problem List   Diagnosis Date Noted  . Hx of CABG 07/12/2020  . Dementia ([place]) 07/12/2020  . Pressure injury of skin 07/12/2020  . UTI (urinary tract infection) 07/12/2020  . Atrial flutter ([place]) 07/11/2020  . Atrial flutter, paroxysmal ([place]) 07/10/2020  . S/P mitral valve repair 06/12/2014  . Atherosclerosis of native coronary artery of native heart without angina pectoris 06/12/2014  . Essential hypertension 06/12/2014  . History of PSVT (paroxysmal supraventricular  tachycardia) 06/12/2014  . Colon cancer ([place]) 08/29/2012    Past Surgical History:  Procedure Laterality Date  . ABDOMINAL HYSTERECTOMY    . APPENDECTOMY    . APPENDECTOMY  08/03/2012   Procedure: APPENDECTOMY;  Surgeon: Madilyn Hook, DO;  Location: [facility];  Service: General;  Laterality: N/A;  incidental appendectomy  . BREAST LUMPECTOMY    . BREAST SURGERY Right 2001  . catarects Bilateral few yrs ago  . COLON SURGERY  07-2012  . COLONOSCOPY WITH PROPOFOL N/A 08/08/2013   Procedure: COLONOSCOPY WITH PROPOFOL;  Surgeon: Garlan Fair, MD;  Location: [facility];  Service: Endoscopy;  Laterality: N/A;  . CORONARY ARTERY BYPASS GRAFT  2003   CABG X 2 (SVGs to DIAG and OM)/MV Repair '03  . DIAGNOSTIC LAPAROSCOPY    . EYE SURGERY      lump removed rt eye  . kidney removed  1960's  . LAPAROTOMY  08/03/2012   Procedure: LAPAROTOMY for Left  Hemicolectomy   ;  Surgeon: Madilyn Hook, DO;  Location: [facility];  Service: General;  Laterality: N/A;  Mini Laparotomy  . PROCTOSCOPY  08/03/2012   Procedure: PROCTOSCOPY;  Surgeon: Madilyn Hook, DO;  Location: [facility];  Service: General;  Laterality: N/A;     OB History   No obstetric history on file.  Family History  Problem Relation Age of Onset  . Heart disease Mother   . Heart disease Father     Social History   Tobacco Use  . Smoking status: Never Smoker  . Smokeless tobacco: Never Used  Substance Use Topics  . Alcohol use: No  . Drug use: No    Home Medications Prior to Admission medications   Medication Sig Start Date End Date Taking? Authorizing Provider  alendronate (FOSAMAX) 70 MG tablet Take 70 mg by mouth once a week. Take with a full glass of water on an empty stomach.    [provider]  amiodarone (PACERONE) 200 MG tablet Take 1 tablet by mouth twice a day for 2 weeks, then take 1 tablet by mouth once a day 07/24/20   Richardson Dopp T, PA-C  apixaban (ELIQUIS) 2.5 MG TABS tablet Take 1 tablet (2.5 mg total) by  mouth 2 (two) times daily. 07/12/20   Furth, Cadence H, PA-C  atenolol (TENORMIN) 25 MG tablet Take 25 mg by mouth every morning.  04/22/12   [provider]  Calcium Carb-Cholecalciferol (CALCIUM+D3) 600-800 MG-UNIT TABS Take 1 tablet by mouth daily.    [provider]  clobetasol ointment (TEMOVATE) 1.19 % Apply 1 application topically 2 (two) times daily.    [provider]  clonazePAM (KLONOPIN) 0.5 MG tablet Take 0.5 mg by mouth at bedtime.     [provider]  hydrochlorothiazide (HYDRODIURIL) 25 MG tablet Take 25 mg by mouth daily.    [provider]  NITROSTAT 0.4 MG SL tablet DISSOLVE 1 TABLET UNDER THE TONGUE EVERY5 MINUTES AS NEEDED UP TO 3 DOSES 02/18/17   Belva Crome, MD  Omega-3 Fatty Acids (FISH OIL) 1000 MG CAPS Take 2,000 mg by mouth daily.     [provider]  polyethylene glycol (MIRALAX / GLYCOLAX) packet Take 17 g by mouth daily as needed (for constipation).    [provider]  potassium chloride SA (KLOR-CON) 20 MEQ tablet Take 20 mEq by mouth daily.    [provider]  sertraline (ZOLOFT) 100 MG tablet Take 100 mg by mouth daily.    [provider]  traMADol (ULTRAM) 50 MG tablet Take 50 mg by mouth every 6 (six) hours as needed for pain.     [provider]    Allergies    Penicillins, Levaquin [levofloxacin], Niacin and related, Norvasc [amlodipine besylate], Novocain [procaine hcl], Pravachol [pravastatin sodium], Statins, Sulfa antibiotics, and Zetia [ezetimibe]  Review of Systems   Review of Systems Ten systems reviewed and are negative for acute change, except as noted in the HPI.   Physical Exam Updated Vital Signs BP 132/74 (BP Location: Left Arm)   Pulse 64   Temp 97.7 F (36.5 C) (Temporal)   Resp 16   Ht 5\' 2"  (1.575 m)   Wt 55 kg   SpO2 99%   BMI 22.18 kg/m   Physical Exam Vitals and nursing note reviewed.  Constitutional:      General: She is not in acute  distress.    Appearance: She is well-developed. She is not diaphoretic.  HENT:     Head: Normocephalic.     Comments: Old bruises on the right temporal region of the head and face Eyes:     General: No scleral icterus.    Conjunctiva/sclera: Conjunctivae normal.  Cardiovascular:     Rate and Rhythm: Normal rate and regular rhythm.     Heart sounds: Normal heart sounds. No  murmur heard.  No friction rub. No gallop.   Pulmonary:     Effort: Pulmonary effort is normal. No respiratory distress.     Breath sounds: Normal breath sounds.  Abdominal:     General: Bowel sounds are normal. There is no distension.     Palpations: Abdomen is soft. There is no mass.     Tenderness: There is no abdominal tenderness. There is no guarding.  Musculoskeletal:     Cervical back: Normal range of motion.     Comments: Moves all extremities without pain, normal strength  Skin:    General: Skin is warm and dry.     Comments: Small skin tear right forearm  Neurological:     Mental Status: She is alert. Mental status is at baseline.     Comments: Speech is clear and goal oriented, follows commands Major Cranial nerves without deficit, no facial droop Normal strength in upper and lower extremities bilaterally including dorsiflexion and plantar flexion, strong and equal grip strength Sensation normal to light and sharp touch Moves extremities without ataxia, coordination intact Normal finger to nose  no pronator drift   Psychiatric:        Behavior: Behavior normal.     ED Results / Procedures / Treatments   Labs (all labs ordered are listed, but only abnormal results are displayed) Labs Reviewed  BASIC METABOLIC PANEL  CBC WITH DIFFERENTIAL/PLATELET  CK  HEPATIC FUNCTION PANEL    EKG None  Radiology No results found.  Procedures .Critical Care Performed by: Margarita Mail, PA-C Authorized by: Margarita Mail, PA-C   Critical care provider statement:    Critical care time  (minutes):  50   Critical care was necessary to treat or prevent imminent or life-threatening deterioration of the following conditions:  CNS failure or compromise and circulatory failure   Critical care was time spent personally by me on the following activities:  Discussions with consultants, evaluation of patient's response to treatment, examination of patient, ordering and performing treatments and interventions, ordering and review of laboratory studies, ordering and review of radiographic studies, pulse oximetry, re-evaluation of patient's condition, obtaining history from patient or surrogate and review of old charts   (including critical care time)  Medications Ordered in ED Medications - No data to display  ED Course  I have reviewed the triage vital signs and the nursing notes.  Pertinent labs & imaging results that were available during my care of the patient were reviewed by me and considered in my medical decision making (see chart for details).    MDM Rules/Calculators/A&P                         WC:HENI VS: BP 136/70 (BP Location: Right Arm)   Pulse 66   Temp 97.7 F (36.5 C) (Temporal)   Resp 16   Ht 5\' 2"  (1.575 m)   Wt 55 kg   SpO2 94%   BMI 22.18 kg/m   DP:OEUMPNT is gathered by ems and family. Previous records obtained and reviewed. DDX:The patient's complaint of fall involves an extensive number of diagnostic and treatment options, and is a complaint that carries with it a high risk of complications, morbidity, and potential mortality. Given the large differential diagnosis, medical decision making is of high complexity. The emergent differential diagnosis for trauma is extensive and requires complex medical decision making. The differential includes, but is not limited to traumatic brain injury, Orbital trauma, maxillofacial trauma, skull fracture, blunt/penetrating  neck trauma, vertebral artery dissection, whiplash, cervical fracture, neurogenic shock, spinal cord  injury, thoracic trauma (blunt/penetrating) cardiac trauma, thoracic and lumbar spine trauma. Abdominal trauma (blunt. Penetrating), genitourinary trauma, extremity fractures, skin lacerations/ abrasions, vascular injuries. Labs: I ordered reviewed and interpreted labs which include CBC which shows no leukocytosis.  Hemoglobin mildly elevated.  BMP shows mild hypokalemia, creatinine is at baseline.  Covid panel negative.  CK within normal limits  Imaging: I ordered and reviewed images which included portable 1 view chest and pelvis along with CT C-spine and CT head. I independently visualized and interpreted all imaging. Significant findings include subacute bilateral subdural hematomas with acute bleeding on the left side. There are no other acute, significant findings on the remainder of today's images. EKG: Consults: Case discussed with NP [facility] regarding neurosurgical intervention.  Recommends reversal with Kcentra, admission and scanning of the patient again tomorrow.  I discussed case with the patient's daughter states that they would not want surgical intervention.  Patient is stable and at baseline. MDM: Patient with bilateral subdural hematomas.  She will be admitted to the hospitalist service.  Kcentra given for reversal of potential life-threatening bleed. Patient disposition:The patient appears reasonably stabilized for admission considering the current resources, flow, and capabilities available in the ED at this time, and I doubt any other [facility] requiring further screening and/or treatment in the ED prior to admission.        Final Clinical Impression(s) / ED Diagnoses Final diagnoses:  Subdural hematoma ([place])  Hypokalemia    Rx / DC Orders ED Discharge Orders    None       Margarita Mail, PA-C 08/27/20 [id number]    Drenda Freeze, MD 08/28/20 [phone]

## 2020-08-27 NOTE — Consult Note (Signed)
Reason for Consult: Bilateral SDH Referring Physician: Margarita Mail, PA  HPI: Marie Burch is an 86 y.o. female who has a history of dementia, paroxysmal atrial fibrillation on Eliquis, HTN, CAD, and CKD presented to the ED after being found down for an undetermined amount of time in the woods after an unwitnessed fall. A CT head was performed and was remarkable for acute on chronic left frontotemporal SDH and a chronic appearing right-sided SDH. She was reported to be at her baseline and had no neurological deficits. Due to her [facility], a neurosurgical consult was requested.   Past Medical History:  Diagnosis Date  . Anxiety   . Arthritis   . Blood transfusion 1960's   During Nephrectomy Procedure  . Cancer [facility]) 2001 and 2002   breast, right  . Cancer of colon ([place]) 08/03/12  . Chronic kidney disease    history of right nephrectomy due to stone '60's  . Complication of anesthesia    "I quit breathing" (patient reported it was due to PCN allergy though '60's)  . Coronary artery disease   . Depression   . GERD (gastroesophageal reflux disease)   . Heart murmur    s/p MV ring annuloplasty '03  . Hyperlipidemia   . Hypertension    dr Daneen Schick  . S/P radiation therapy 11/29/01 - 03/18/02   Right Breast: 5,040 cGy/28 Fractions with Boost for Total Dose  of 6,3000 cGy    Past Surgical History:  Procedure Laterality Date  . ABDOMINAL HYSTERECTOMY    . APPENDECTOMY    . APPENDECTOMY  08/03/2012   Procedure: APPENDECTOMY;  Surgeon: Madilyn Hook, DO;  Location: [facility];  Service: General;  Laterality: N/A;  incidental appendectomy  . BREAST LUMPECTOMY    . BREAST SURGERY Right 2001  . catarects Bilateral few yrs ago  . COLON SURGERY  07-2012  . COLONOSCOPY WITH PROPOFOL N/A 08/08/2013   Procedure: COLONOSCOPY WITH PROPOFOL;  Surgeon: Garlan Fair, MD;  Location: [facility];  Service: Endoscopy;  Laterality: N/A;  . CORONARY ARTERY BYPASS GRAFT  2003   CABG X 2 (SVGs to DIAG  and OM)/MV Repair '03  . DIAGNOSTIC LAPAROSCOPY    . EYE SURGERY      lump removed rt eye  . kidney removed  1960's  . LAPAROTOMY  08/03/2012   Procedure: LAPAROTOMY for Left  Hemicolectomy   ;  Surgeon: Madilyn Hook, DO;  Location: [facility];  Service: General;  Laterality: N/A;  Mini Laparotomy  . PROCTOSCOPY  08/03/2012   Procedure: PROCTOSCOPY;  Surgeon: Madilyn Hook, DO;  Location: [facility];  Service: General;  Laterality: N/A;    Family History  Problem Relation Age of Onset  . Heart disease Mother   . Heart disease Father     Social History:  reports that she has never smoked. She has never used smokeless tobacco. She reports that she does not drink alcohol and does not use drugs.  Allergies:  Allergies  Allergen Reactions  . Penicillins Anaphylaxis  . Levaquin [Levofloxacin] Other (See Comments)    PT TOLD TO LEAVE ANTIBIOTICS ALONE AFTER NEPHRECTOMY PT HAD REACTION TO ANTIBIOTICS  . Niacin And Related Other (See Comments)    unknown  . Norvasc [Amlodipine Besylate] Other (See Comments)    Unknown, PT NOT SURE  . Novocain [Procaine Hcl]     Numbness would not leave   . Pravachol [Pravastatin Sodium] Other (See Comments)    Unknown, ? Questionable muscle aches  . Statins  Other (See Comments)    Unknown, questionable muscle aches  . Sulfa Antibiotics   . Zetia [Ezetimibe] Other (See Comments)    Unknown, pt not sure    Medications: I have reviewed the patient's current medications.  Results for orders placed or performed during the hospital encounter of 08/27/20 (from the past 48 hour(s))  Basic metabolic panel     Status: Abnormal   Collection Time: 08/27/20  8:35 PM  Result Value Ref Range   Sodium 136 135 - 145 mmol/L   Potassium 3.0 (L) 3.5 - 5.1 mmol/L   Chloride 96 (L) 98 - 111 mmol/L   CO2 27 22 - 32 mmol/L   Glucose, Bld 140 (H) 70 - 99 mg/dL    Comment: Glucose reference range applies only to samples taken after fasting for at least 8 hours.   BUN 18 8 - 23  mg/dL   Creatinine, Ser 1.26 (H) 0.44 - 1.00 mg/dL   Calcium 9.3 8.9 - 10.3 mg/dL   GFR, Estimated 39 (L) >60 mL/min   Anion gap 13 5 - 15    Comment: Performed at [facility] [address], [place], [place] [address]  CBC with Differential     Status: Abnormal   Collection Time: 08/27/20  8:35 PM  Result Value Ref Range   WBC 8.4 4.0 - 10.5 K/uL   RBC 5.46 (H) 3.87 - 5.11 MIL/uL   Hemoglobin 15.3 (H) 12.0 - 15.0 g/dL   HCT 47.2 (H) 36 - 46 %   MCV 86.4 80.0 - 100.0 fL   MCH 28.0 26.0 - 34.0 pg   MCHC 32.4 30.0 - 36.0 g/dL   RDW 14.0 11.5 - 15.5 %   Platelets 197 150 - 400 K/uL   nRBC 0.0 0.0 - 0.2 %   Neutrophils Relative % 68 %   Neutro Abs 5.6 1.7 - 7.7 K/uL   Lymphocytes Relative 20 %   Lymphs Abs 1.7 0.7 - 4.0 K/uL   Monocytes Relative 9 %   Monocytes Absolute 0.8 0.1 - 1.0 K/uL   Eosinophils Relative 2 %   Eosinophils Absolute 0.2 0.0 - 0.5 K/uL   Basophils Relative 0 %   Basophils Absolute 0.0 0.0 - 0.1 K/uL   Immature Granulocytes 1 %   Abs Immature Granulocytes 0.11 (H) 0.00 - 0.07 K/uL    Comment: Performed at [facility] [address] [place], [place] [address]  CK     Status: None   Collection Time: 08/27/20  8:35 PM  Result Value Ref Range   Total CK 49 38.0 - 234.0 U/L    Comment: Performed at [facility], [place] [address] [place], [place] [address]  Respiratory Panel by RT PCR (Flu A&B, Covid) - Nasopharyngeal Swab     Status: None   Collection Time: 08/27/20  9:09 PM   Specimen: Nasopharyngeal Swab  Result Value Ref Range   SARS Coronavirus 2 by RT PCR NEGATIVE NEGATIVE    Comment: (NOTE) SARS-CoV-2 target nucleic acids are NOT DETECTED.  The SARS-CoV-2 RNA is generally detectable in upper respiratoy specimens during the acute phase of infection. The lowest concentration of SARS-CoV-2 viral copies this assay can detect is 131 copies/mL. A negative result does not preclude SARS-Cov-2 infection and should not be used as the sole  basis for treatment or other patient management decisions. A negative result may occur with  improper specimen collection/handling, submission of specimen other than nasopharyngeal swab, presence of  viral mutation(s) within the areas targeted by this assay, and inadequate number of viral copies (<131 copies/mL). A negative result must be combined with clinical observations, patient history, and epidemiological information. The expected result is Negative.  Fact Sheet for Patients:  PinkCheek.be  Fact Sheet for Healthcare Providers:  GravelBags.it  This test is no t yet approved or cleared by the [place] FDA and  has been authorized for detection and/or diagnosis of SARS-CoV-2 by FDA under an Emergency Use Authorization (EUA). This EUA will remain  in effect (meaning this test can be used) for the duration of the COVID-19 declaration under Section 564(b)(1) of the Act, 21 U.S.C. section 360bbb-3(b)(1), unless the authorization is terminated or revoked sooner.     Influenza A by PCR NEGATIVE NEGATIVE   Influenza B by PCR NEGATIVE NEGATIVE    Comment: (NOTE) The Xpert Xpress SARS-CoV-2/FLU/RSV assay is intended as an aid in  the diagnosis of influenza from Nasopharyngeal swab specimens and  should not be used as a sole basis for treatment. Nasal washings and  aspirates are unacceptable for Xpert Xpress SARS-CoV-2/FLU/RSV  testing.  Fact Sheet for Patients: PinkCheek.be  Fact Sheet for Healthcare Providers: GravelBags.it  This test is not yet approved or cleared by the [place] FDA and  has been authorized for detection and/or diagnosis of SARS-CoV-2 by  FDA under an Emergency Use Authorization (EUA). This EUA will remain  in effect (meaning this test can be used) for the duration of the  Covid-19 declaration under Section 564(b)(1) of the Act, 21   U.S.C. section 360bbb-3(b)(1), unless the authorization is  terminated or revoked. Performed at [facility], [place] [address], [place], [place] [address]     CT Head Wo Contrast  Result Date: 08/27/2020 CLINICAL DATA:  86 year old female with neck trauma. EXAM: CT HEAD WITHOUT CONTRAST CT CERVICAL SPINE WITHOUT CONTRAST TECHNIQUE: Multidetector CT imaging of the head and cervical spine was performed following the standard protocol without intravenous contrast. Multiplanar CT image reconstructions of the cervical spine were also generated. COMPARISON:  Cervical spine CT dated 08/16/2020. FINDINGS: CT HEAD FINDINGS Brain: Large bilateral hemispheric low attenuating subdural collections, likely subacute or chronic, but new since the prior CT of 02/17/2020 measuring up to 1 cm in thickness. There is high attenuating subdural blood over the left temporal and parietal lobes measuring approximately 9 mm in thickness consistent with acute bleed. There is associated mass effect on the brain parenchyma by the bilateral subdural collections. No midline shift. Vascular: No hyperdense vessel or unexpected calcification. Skull: Normal. Negative for fracture or focal lesion. Sinuses/Orbits: No acute finding. Other: None CT CERVICAL SPINE FINDINGS Alignment: No acute subluxation. Skull base and vertebrae: No acute fracture. Osteopenia. Probable small bone island in C6. Soft tissues and spinal canal: No prevertebral fluid or swelling. No visible canal hematoma. Disc levels: Multilevel degenerative changes with endplate irregularity and disc space narrowing. Upper chest: Negative. Other: Subcentimeter left thyroid hypodense nodule Not clinically significant; no follow-up imaging recommended (ref: J Am Coll Radiol. 2015 Feb;12(2): 143-50). IMPRESSION: 1. Acute subdural hemorrhage over the left frontal and temporal lobe on subacute or chronic bilateral hemispheric subdural hemorrhages. Moderate mass effect on the brain  parenchyma. No midline shift. 2. No acute/traumatic cervical spine pathology. These results were called by telephone at the time of interpretation on 08/27/2020 at 9:47 pm to provider ABIGAIL HARRIS , who verbally acknowledged these results. Electronically Signed   By: Anner Crete M.D.   On: 08/27/2020 21:50  CT Cervical Spine Wo Contrast  Result Date: 08/27/2020 CLINICAL DATA:  86 year old female with neck trauma. EXAM: CT HEAD WITHOUT CONTRAST CT CERVICAL SPINE WITHOUT CONTRAST TECHNIQUE: Multidetector CT imaging of the head and cervical spine was performed following the standard protocol without intravenous contrast. Multiplanar CT image reconstructions of the cervical spine were also generated. COMPARISON:  Cervical spine CT dated 08/16/2020. FINDINGS: CT HEAD FINDINGS Brain: Large bilateral hemispheric low attenuating subdural collections, likely subacute or chronic, but new since the prior CT of 02/17/2020 measuring up to 1 cm in thickness. There is high attenuating subdural blood over the left temporal and parietal lobes measuring approximately 9 mm in thickness consistent with acute bleed. There is associated mass effect on the brain parenchyma by the bilateral subdural collections. No midline shift. Vascular: No hyperdense vessel or unexpected calcification. Skull: Normal. Negative for fracture or focal lesion. Sinuses/Orbits: No acute finding. Other: None CT CERVICAL SPINE FINDINGS Alignment: No acute subluxation. Skull base and vertebrae: No acute fracture. Osteopenia. Probable small bone island in C6. Soft tissues and spinal canal: No prevertebral fluid or swelling. No visible canal hematoma. Disc levels: Multilevel degenerative changes with endplate irregularity and disc space narrowing. Upper chest: Negative. Other: Subcentimeter left thyroid hypodense nodule Not clinically significant; no follow-up imaging recommended (ref: J Am Coll Radiol. 2015 Feb;12(2): 143-50). IMPRESSION: 1. Acute  subdural hemorrhage over the left frontal and temporal lobe on subacute or chronic bilateral hemispheric subdural hemorrhages. Moderate mass effect on the brain parenchyma. No midline shift. 2. No acute/traumatic cervical spine pathology. These results were called by telephone at the time of interpretation on 08/27/2020 at 9:47 pm to provider ABIGAIL HARRIS , who verbally acknowledged these results. Electronically Signed   By: Anner Crete M.D.   On: 08/27/2020 21:50   DG Pelvis Portable  Result Date: 08/27/2020 CLINICAL DATA:  Recent fall with pelvic pain, initial encounter EXAM: PORTABLE PELVIS 1-2 VIEWS COMPARISON:  08/16/2020 FINDINGS: Pelvic ring is intact. Degenerative changes of the hip joints and lumbar spine are seen. No acute fracture or dislocation is noted. IMPRESSION: No acute abnormality noted. Electronically Signed   By: Inez Catalina M.D.   On: 08/27/2020 20:46   DG Chest Port 1 View  Result Date: 08/27/2020 CLINICAL DATA:  Recent fall with chest pain, initial encounter EXAM: PORTABLE CHEST 1 VIEW COMPARISON:  08/16/2020 FINDINGS: Cardiac shadow is mildly enlarged but stable. Postsurgical changes are seen. Aortic calcifications are noted. Tortuous vasculature is noted in the superior mediastinum. The lungs are well aerated bilaterally. No focal infiltrate is seen. No acute bony abnormality is noted. IMPRESSION: No acute abnormality noted. Electronically Signed   By: Inez Catalina M.D.   On: 08/27/2020 20:45    Review of Systems Per HPI Blood pressure 137/66, pulse 65, temperature 97.7 F (36.5 C), temperature source Temporal, resp. rate 18, height 5\' 2"  (1.575 m), weight 55 kg, SpO2 96 %. Physical Exam:  Constitutional:      General: She is not in acute distress.    Appearance: She is well-developed. She is not diaphoretic.  Neurological:     Mental Status: She is alert. Mental status is at baseline.  Speech is clear follows commands. Cranial nerves grossly intact, sensation  is intact, MAEW with full strength on confrontational testing. No ataxia present.   Assessment/Plan: 86 y.o. female who has a history of dementia who lives in a memory care center was found down after an unwitnessed fall. She was brought into the ED where a CT  head revealed significant brain atrophy and chronic bilateral subdural hematomas with an acute on chronic SDH over the left frontal and temporal lobe. No MLS or mass effect was appreciated. Patient is reported to be at her baseline status and is without any neurological deficits. She is chronically anticoagulated on Eliquis. Would recommend admitting the patient to the medicine service for overnight observation with reversal of her Eliquis and a follow up CT head in the morning. She is not an ideal candidate for a surgical intervention and would not recommend proceeding with surgery. Neurosurgery to follow. Call with any questions.   Marvis Moeller, DNP, NP-C 08/27/2020, 11:33 PM

## 2020-08-27 NOTE — Progress Notes (Signed)
   08/27/20 2000  Clinical Encounter Type  Visited With Patient not available  Visit Type Trauma  Referral From Nurse  Consult/Referral To Chaplain  The chaplain responded to a trauma 2 page. No family with the patient. The patient is from a nursing facility. The chaplain will follow up as needed.

## 2020-08-27 NOTE — ED Triage Notes (Addendum)
Patient from [facility], unwitnessed fall, found outside of the facility.  She denies any LOC or hit her head but she does have a history of dementia.  No complaints of pain at this time.  Abrasion to right forearm. Patient is at baseline.  Patient is on eliquis.

## 2020-08-27 NOTE — Progress Notes (Signed)
Orthopedic Tech Progress Note Patient Details:  Marie Burch Oct 07, 1934 [id number] Level 2 Trauma Patient ID: [name], female   DOB: 03/31/1934, 86 y.o.   MRN: [id number]   [name] 08/27/2020, 8:28 PM

## 2020-08-28 ENCOUNTER — Other Ambulatory Visit: Payer: Self-pay

## 2020-08-28 ENCOUNTER — Observation Stay (HOSPITAL_COMMUNITY): Payer: PPO

## 2020-08-28 ENCOUNTER — Encounter (HOSPITAL_COMMUNITY): Payer: Self-pay | Admitting: Internal Medicine

## 2020-08-28 DIAGNOSIS — I2510 Atherosclerotic heart disease of native coronary artery without angina pectoris: Secondary | ICD-10-CM | POA: Diagnosis not present

## 2020-08-28 DIAGNOSIS — F0391 Unspecified dementia with behavioral disturbance: Secondary | ICD-10-CM

## 2020-08-28 DIAGNOSIS — I6200 Nontraumatic subdural hemorrhage, unspecified: Secondary | ICD-10-CM | POA: Diagnosis not present

## 2020-08-28 DIAGNOSIS — I10 Essential (primary) hypertension: Secondary | ICD-10-CM | POA: Diagnosis not present

## 2020-08-28 DIAGNOSIS — G9389 Other specified disorders of brain: Secondary | ICD-10-CM | POA: Diagnosis not present

## 2020-08-28 DIAGNOSIS — R220 Localized swelling, mass and lump, head: Secondary | ICD-10-CM | POA: Diagnosis not present

## 2020-08-28 DIAGNOSIS — W19XXXA Unspecified fall, initial encounter: Secondary | ICD-10-CM

## 2020-08-28 DIAGNOSIS — S065XAA Traumatic subdural hemorrhage with loss of consciousness status unknown, initial encounter: Secondary | ICD-10-CM | POA: Diagnosis present

## 2020-08-28 DIAGNOSIS — S065X9A Traumatic subdural hemorrhage with loss of consciousness of unspecified duration, initial encounter: Secondary | ICD-10-CM | POA: Diagnosis not present

## 2020-08-28 DIAGNOSIS — S065X0A Traumatic subdural hemorrhage without loss of consciousness, initial encounter: Secondary | ICD-10-CM | POA: Diagnosis not present

## 2020-08-28 DIAGNOSIS — E876 Hypokalemia: Secondary | ICD-10-CM

## 2020-08-28 DIAGNOSIS — E782 Mixed hyperlipidemia: Secondary | ICD-10-CM | POA: Diagnosis present

## 2020-08-28 DIAGNOSIS — I4892 Unspecified atrial flutter: Secondary | ICD-10-CM

## 2020-08-28 HISTORY — DX: Unspecified fall, initial encounter: W19.XXXA

## 2020-08-28 LAB — URINALYSIS, COMPLETE (UACMP) WITH MICROSCOPIC
Bilirubin Urine: NEGATIVE
Glucose, UA: NEGATIVE mg/dL
Hgb urine dipstick: NEGATIVE
Ketones, ur: NEGATIVE mg/dL
Leukocytes,Ua: NEGATIVE
Nitrite: NEGATIVE
Protein, ur: NEGATIVE mg/dL
Specific Gravity, Urine: 1.005 (ref 1.005–1.030)
pH: 5 (ref 5.0–8.0)

## 2020-08-28 LAB — CBC WITH DIFFERENTIAL/PLATELET
Abs Immature Granulocytes: 0.09 10*3/uL — ABNORMAL HIGH (ref 0.00–0.07)
Basophils Absolute: 0 10*3/uL (ref 0.0–0.1)
Basophils Relative: 0 %
Eosinophils Absolute: 0.1 10*3/uL (ref 0.0–0.5)
Eosinophils Relative: 2 %
HCT: 45.3 % (ref 36.0–46.0)
Hemoglobin: 14.5 g/dL (ref 12.0–15.0)
Immature Granulocytes: 1 %
Lymphocytes Relative: 25 %
Lymphs Abs: 1.9 10*3/uL (ref 0.7–4.0)
MCH: 28.3 pg (ref 26.0–34.0)
MCHC: 32 g/dL (ref 30.0–36.0)
MCV: 88.3 fL (ref 80.0–100.0)
Monocytes Absolute: 0.6 10*3/uL (ref 0.1–1.0)
Monocytes Relative: 8 %
Neutro Abs: 4.8 10*3/uL (ref 1.7–7.7)
Neutrophils Relative %: 64 %
Platelets: 182 10*3/uL (ref 150–400)
RBC: 5.13 MIL/uL — ABNORMAL HIGH (ref 3.87–5.11)
RDW: 13.8 % (ref 11.5–15.5)
WBC: 7.6 10*3/uL (ref 4.0–10.5)
nRBC: 0 % (ref 0.0–0.2)

## 2020-08-28 LAB — COMPREHENSIVE METABOLIC PANEL
ALT: 15 U/L (ref 0–44)
AST: 22 U/L (ref 15–41)
Albumin: 3.5 g/dL (ref 3.5–5.0)
Alkaline Phosphatase: 38 U/L (ref 38–126)
Anion gap: 11 (ref 5–15)
BUN: 11 mg/dL (ref 8–23)
CO2: 28 mmol/L (ref 22–32)
Calcium: 8.9 mg/dL (ref 8.9–10.3)
Chloride: 102 mmol/L (ref 98–111)
Creatinine, Ser: 1.03 mg/dL — ABNORMAL HIGH (ref 0.44–1.00)
GFR, Estimated: 50 mL/min — ABNORMAL LOW (ref >60)
Glucose, Bld: 119 mg/dL — ABNORMAL HIGH (ref 70–99)
Potassium: 3.1 mmol/L — ABNORMAL LOW (ref 3.5–5.1)
Sodium: 141 mmol/L (ref 135–145)
Total Bilirubin: 0.6 mg/dL (ref 0.3–1.2)
Total Protein: 6.3 g/dL — ABNORMAL LOW (ref 6.5–8.1)

## 2020-08-28 LAB — HEPATIC FUNCTION PANEL
ALT: 17 U/L (ref 0–44)
AST: 22 U/L (ref 15–41)
Albumin: 3.6 g/dL (ref 3.5–5.0)
Alkaline Phosphatase: 38 U/L (ref 38–126)
Bilirubin, Direct: 0.2 mg/dL (ref 0.0–0.2)
Indirect Bilirubin: 0.4 mg/dL (ref 0.3–0.9)
Total Bilirubin: 0.6 mg/dL (ref 0.3–1.2)
Total Protein: 6.3 g/dL — ABNORMAL LOW (ref 6.5–8.1)

## 2020-08-28 LAB — PROTIME-INR
INR: 1.1 (ref 0.8–1.2)
Prothrombin Time: 13.4 seconds (ref 11.4–15.2)

## 2020-08-28 LAB — APTT: aPTT: 34 seconds (ref 24–36)

## 2020-08-28 LAB — MAGNESIUM: Magnesium: 1.6 mg/dL — ABNORMAL LOW (ref 1.7–2.4)

## 2020-08-28 MED ORDER — MAGNESIUM SULFATE 2 GM/50ML IV SOLN
2.0000 g | Freq: Once | INTRAVENOUS | Status: AC
  Administered 2020-08-28: 2 g via INTRAVENOUS
  Filled 2020-08-28: qty 50

## 2020-08-28 MED ORDER — POTASSIUM CHLORIDE CRYS ER 20 MEQ PO TBCR
40.0000 meq | EXTENDED_RELEASE_TABLET | Freq: Once | ORAL | Status: AC
  Administered 2020-08-28: 40 meq via ORAL
  Filled 2020-08-28: qty 2

## 2020-08-28 MED ORDER — CLONAZEPAM 0.5 MG PO TABS
0.5000 mg | ORAL_TABLET | Freq: Every day | ORAL | Status: DC
  Administered 2020-08-28 – 2020-08-30 (×4): 0.5 mg via ORAL
  Filled 2020-08-28 (×4): qty 1

## 2020-08-28 MED ORDER — LACTATED RINGERS IV SOLN: INTRAVENOUS | Status: DC

## 2020-08-28 MED ORDER — ONDANSETRON HCL 4 MG PO TABS: 4.0000 mg | ORAL_TABLET | Freq: Four times a day (QID) | ORAL | Status: DC | PRN

## 2020-08-28 MED ORDER — AMIODARONE HCL 200 MG PO TABS
200.0000 mg | ORAL_TABLET | Freq: Every day | ORAL | Status: DC
  Administered 2020-08-28 – 2020-08-31 (×4): 200 mg via ORAL
  Filled 2020-08-28 (×4): qty 1

## 2020-08-28 MED ORDER — ACETAMINOPHEN 650 MG RE SUPP: 650.0000 mg | Freq: Four times a day (QID) | RECTAL | Status: DC | PRN

## 2020-08-28 MED ORDER — ACETAMINOPHEN 325 MG PO TABS
650.0000 mg | ORAL_TABLET | Freq: Four times a day (QID) | ORAL | Status: DC | PRN
  Administered 2020-08-30 – 2020-08-31 (×2): 650 mg via ORAL
  Filled 2020-08-28 (×2): qty 2

## 2020-08-28 MED ORDER — SERTRALINE HCL 100 MG PO TABS
100.0000 mg | ORAL_TABLET | Freq: Every day | ORAL | Status: DC
  Administered 2020-08-28 – 2020-08-31 (×4): 100 mg via ORAL
  Filled 2020-08-28 (×4): qty 1

## 2020-08-28 MED ORDER — ONDANSETRON HCL 4 MG/2ML IJ SOLN: 4.0000 mg | Freq: Four times a day (QID) | INTRAMUSCULAR | Status: DC | PRN

## 2020-08-28 MED ORDER — POLYETHYLENE GLYCOL 3350 17 G PO PACK
17.0000 g | PACK | Freq: Every day | ORAL | Status: DC | PRN
  Administered 2020-08-31: 17 g via ORAL
  Filled 2020-08-28: qty 1

## 2020-08-28 MED ORDER — LACTATED RINGERS IV BOLUS
500.0000 mL | Freq: Once | INTRAVENOUS | Status: AC
  Administered 2020-08-28: 500 mL via INTRAVENOUS

## 2020-08-28 MED ORDER — LACTATED RINGERS IV SOLN: INTRAVENOUS | Status: AC

## 2020-08-28 MED ORDER — POTASSIUM CHLORIDE 20 MEQ PO PACK
40.0000 meq | PACK | Freq: Once | ORAL | Status: AC
  Administered 2020-08-28: 40 meq via ORAL
  Filled 2020-08-28: qty 2

## 2020-08-28 MED ORDER — NITROGLYCERIN 0.4 MG SL SUBL: 0.4000 mg | SUBLINGUAL_TABLET | SUBLINGUAL | Status: DC | PRN

## 2020-08-28 MED ORDER — ATENOLOL 25 MG PO TABS
25.0000 mg | ORAL_TABLET | Freq: Every morning | ORAL | Status: DC
  Administered 2020-08-28 – 2020-08-31 (×4): 25 mg via ORAL
  Filled 2020-08-28 (×4): qty 1

## 2020-08-28 NOTE — Care Plan (Signed)
This 86 years old female with PMH of coronary artery disease, hypertension, advanced dementia,  paroxysmal A. fib, hyperlipidemia,presents to the emergency department status post unwitnessed fall.  Patient was found down for an unknown duration of time behind her nursing home facility.  Patient did not suffer any obvious head trauma.  Patient was recently started on Eliquis therapy for paroxysmal A. fib.  CT head shows subdural hemorrhage over the left frontal and temporal lobes.  Neurosurgery was consulted recommended neurochecks and repeat CT in 24 hours.  Due to patient's Eliquis use Kcentra was administered.  Eliquis is on hold.  Repeat CT scan shows a stable hematomas.  Neurosurgery states patient would be a poor surgical candidate. Patient was seen in the ED.  Patient was sleepy but arousable. she is alert and oriented x 2.

## 2020-08-28 NOTE — H&P (Signed)
History and Physical    Marie Burch GYJ:[id number] DOB: 1934/07/08 DOA: 08/27/2020  PCP: Lajean Manes, MD  Patient coming from: Marie Burch   Chief Complaint:  Chief Complaint  Patient presents with  . Marie Burch     HPI:    86 year old female with past medical history of coronary artery disease, hypertension, paroxysmal atrial fibrillation, hyperlipidemia and dementia who presents to [facility] emergency department, brought in by EMS status post unwitnessed fall.  Patient is an extremely poor historian due to advanced dementia.  Majority of the history is been obtained from both the emergency department staff and EMS.  Patient had wandered out of her skilled facility earlier in the day on 10/19.  Patient was eventually found behind her facility [place].  Patient was found down for an unknown amount of time.  Fall was unwitnessed.  Patient did not suffer an obvious head trauma.  EMS was contacted, patient was placed in a c-collar and patient was brought into" hospital emergency department for evaluation.  Of note, patient additionally presented to [facility] emergency department on October 8 for an additional unwitnessed fall.  Patient suffered superficial injuries including a scalp hematoma and was sent home without medication change.  Upon evaluation in the emergency department CT imaging of the head was performed, especially considering patient's recent initiation of Eliquis therapy for atrial fibrillation in September.  CT of the head revealed acute subdural hemorrhage over the left frontal and temporal lobes in addition to subacute bilateral hemispheric subdural hemorrhages.  Case was discussed with APP Fenton Malling neurosurgery who recommended overnight observation with serial neurologic checks and repeat CT imaging in the morning.  Due to patient's ongoing Eliquis use, Kcentra was administered.  The hospitalist group was then called to  assess the patient for admission to the hospital.  Review of Systems:   Review of Systems  Unable to perform ROS: Mental status change    Past Medical History:  Diagnosis Date  . Anxiety   . Arthritis   . Blood transfusion 1960's   During Nephrectomy Procedure  . Cancer [facility]) 2001 and 2002   breast, right  . Cancer of colon ([place]) 08/03/12  . Chronic kidney disease    history of right nephrectomy due to stone '60's  . Complication of anesthesia    "I quit breathing" (patient reported it was due to PCN allergy though '60's)  . Coronary artery disease   . Depression   . GERD (gastroesophageal reflux disease)   . Heart murmur    s/p MV ring annuloplasty '03  . Hyperlipidemia   . Hypertension    dr Daneen Schick  . S/P radiation therapy 11/29/01 - 03/18/02   Right Breast: 5,040 cGy/28 Fractions with Boost for Total Dose  of 6,3000 cGy    Past Surgical History:  Procedure Laterality Date  . ABDOMINAL HYSTERECTOMY    . APPENDECTOMY    . APPENDECTOMY  08/03/2012   Procedure: APPENDECTOMY;  Surgeon: Madilyn Hook, DO;  Location: [place];  Service: General;  Laterality: N/A;  incidental appendectomy  . BREAST LUMPECTOMY    . BREAST SURGERY Right 2001  . catarects Bilateral few yrs ago  . COLON SURGERY  07-2012  . COLONOSCOPY WITH PROPOFOL N/A 08/08/2013   Procedure: COLONOSCOPY WITH PROPOFOL;  Surgeon: Garlan Fair, MD;  Location: [facility];  Service: Endoscopy;  Laterality: N/A;  . CORONARY ARTERY BYPASS GRAFT  2003   CABG X 2 (SVGs  to DIAG and OM)/MV Repair '03  . DIAGNOSTIC LAPAROSCOPY    . EYE SURGERY      lump removed rt eye  . kidney removed  1960's  . LAPAROTOMY  08/03/2012   Procedure: LAPAROTOMY for Left  Hemicolectomy   ;  Surgeon: Madilyn Hook, DO;  Location: [place];  Service: General;  Laterality: N/A;  Mini Laparotomy  . PROCTOSCOPY  08/03/2012   Procedure: PROCTOSCOPY;  Surgeon: Madilyn Hook, DO;  Location: [place];  Service: General;  Laterality: N/A;     reports  that she has never smoked. She has never used smokeless tobacco. She reports that she does not drink alcohol and does not use drugs.  Allergies  Allergen Reactions  . Penicillins Anaphylaxis  . Levaquin [Levofloxacin] Other (See Comments)    PT TOLD TO LEAVE ANTIBIOTICS ALONE AFTER NEPHRECTOMY PT HAD REACTION TO ANTIBIOTICS  . Niacin And Related Other (See Comments)    unknown  . Norvasc [Amlodipine Besylate] Other (See Comments)    Unknown, PT NOT SURE  . Novocain [Procaine Hcl]     Numbness would not leave   . Pravachol [Pravastatin Sodium] Other (See Comments)    Unknown, ? Questionable muscle aches  . Statins Other (See Comments)    Unknown, questionable muscle aches  . Sulfa Antibiotics   . Zetia [Ezetimibe] Other (See Comments)    Unknown, pt not sure    Family History  Problem Relation Age of Onset  . Heart disease Mother   . Heart disease Father      Prior to Admission medications   Medication Sig Start Date End Date Taking? Authorizing Provider  alendronate (FOSAMAX) 70 MG tablet Take 70 mg by mouth once a week. Take with a full glass of water on an empty stomach.   Yes [provider]  amiodarone (PACERONE) 200 MG tablet Take 1 tablet by mouth twice a day for 2 weeks, then take 1 tablet by mouth once a day Patient taking differently: Take 200 mg by mouth daily.  07/24/20  Yes Weaver, Scott T, PA-C  apixaban (ELIQUIS) 2.5 MG TABS tablet Take 1 tablet (2.5 mg total) by mouth 2 (two) times daily. 07/12/20  Yes Furth, Cadence H, PA-C  atenolol (TENORMIN) 25 MG tablet Take 25 mg by mouth every morning.  04/22/12  Yes [provider]  Calcium Carb-Cholecalciferol (CALCIUM+D3) 600-800 MG-UNIT TABS Take 1 tablet by mouth daily.   Yes [provider]  clonazePAM (KLONOPIN) 0.5 MG tablet Take 0.5 mg by mouth at bedtime.    Yes [provider]  hydrochlorothiazide (HYDRODIURIL) 25 MG tablet Take 25 mg by mouth daily.   Yes [provider]    NITROSTAT 0.4 MG SL tablet DISSOLVE 1 TABLET UNDER THE TONGUE EVERY5 MINUTES AS NEEDED UP TO 3 DOSES Patient taking differently: Place 0.4 mg under the tongue every 5 (five) minutes as needed for chest pain.  02/18/17  Yes Belva Crome, MD  Omega-3 Fatty Acids (FISH OIL) 1000 MG CAPS Take 2,000 mg by mouth daily.    Yes [provider]  polyethylene glycol (MIRALAX / GLYCOLAX) packet Take 17 g by mouth daily as needed (for constipation).   Yes [provider]  potassium chloride SA (KLOR-CON) 20 MEQ tablet Take 20 mEq by mouth daily.   Yes [provider]  sertraline (ZOLOFT) 100 MG tablet Take 100 mg by mouth daily.   Yes [provider]  traMADol (ULTRAM) 50 MG tablet Take 50 mg by  mouth every 6 (six) hours as needed for pain.    Yes [provider]    Physical Exam: Vitals:   08/28/20 0030 08/28/20 0045 08/28/20 0100 08/28/20 0145  BP: 106/74 (!) 102/91 135/76 (!) 112/97  Pulse: 64     Resp: (!) 24     Temp:      TempSrc:      SpO2: 94%     Weight:      Height:        Constitutional: Patient is lethargic but arousable, oriented x1, not in any acute distress. Skin: no rashes, no lesions, poor skin turgor noted. Eyes: Pupils are equally reactive to light.  No evidence of scleral icterus or conjunctival pallor.  ENMT: Dry mucous membranes noted.  Posterior pharynx clear of any exudate or lesions.   Neck: normal, supple, no masses, no thyromegaly.  No evidence of jugular venous distension.   Respiratory: clear to auscultation bilaterally, no wheezing, no crackles. Normal respiratory effort. No accessory muscle use.  Cardiovascular: Regular rate and rhythm, no murmurs / rubs / gallops. No extremity edema. 2+ pedal pulses. No carotid bruits.  Chest:   Nontender without crepitus or deformity.   Back:   Nontender without crepitus or deformity. Abdomen: Abdomen is soft and nontender.  No evidence of intra-abdominal masses.  Positive bowel  sounds noted in all quadrants.   Musculoskeletal: No joint deformity upper and lower extremities. Good ROM, no contractures. Normal muscle tone.  Neurologic: CN 2-12 grossly intact. Sensation intact.  Patient moving all 4 extremities spontaneously.  Patient is following all commands.  Patient is responsive to verbal stimuli.   Psychiatric: Patient exhibits normal mood with appropriate affect.  Patient seems to possess insight as to their current situation.     Labs on Admission: I have personally reviewed following labs and imaging studies -   CBC: Recent Labs  Lab 08/27/20 2035  WBC 8.4  NEUTROABS 5.6  HGB 15.3*  HCT 47.2*  MCV 86.4  PLT 124   Basic Metabolic Panel: Recent Labs  Lab 08/27/20 2035  NA 136  K 3.0*  CL 96*  CO2 27  GLUCOSE 140*  BUN 18  CREATININE 1.26*  CALCIUM 9.3   GFR: Estimated Creatinine Clearance: 26.3 mL/min (A) (by C-G formula based on SCr of 1.26 mg/dL (H)). Liver Function Tests: No results for input(s): AST, ALT, ALKPHOS, BILITOT, PROT, ALBUMIN in the last 168 hours. No results for input(s): LIPASE, AMYLASE in the last 168 hours. No results for input(s): AMMONIA in the last 168 hours. Coagulation Profile: No results for input(s): INR, PROTIME in the last 168 hours. Cardiac Enzymes: Recent Labs  Lab 08/27/20 2035  CKTOTAL 49   BNP (last 3 results) No results for input(s): PROBNP in the last 8760 hours. HbA1C: No results for input(s): HGBA1C in the last 72 hours. CBG: No results for input(s): GLUCAP in the last 168 hours. Lipid Profile: No results for input(s): CHOL, HDL, LDLCALC, TRIG, CHOLHDL, LDLDIRECT in the last 72 hours. Thyroid Function Tests: No results for input(s): TSH, T4TOTAL, FREET4, T3FREE, THYROIDAB in the last 72 hours. Anemia Panel: No results for input(s): VITAMINB12, FOLATE, FERRITIN, TIBC, IRON, RETICCTPCT in the last 72 hours. Urine analysis:    Component Value Date/Time   COLORURINE YELLOW 08/16/2020 1858    APPEARANCEUR CLEAR 08/16/2020 1858   LABSPEC 1.015 08/16/2020 1858   PHURINE 6.0 08/16/2020 1858   GLUCOSEU NEGATIVE 08/16/2020 1858   HGBUR NEGATIVE 08/16/2020 [place] NEGATIVE 08/16/2020  [place] 08/16/2020 [place] 08/16/2020 1858   NITRITE NEGATIVE 08/16/2020 1858   LEUKOCYTESUR LARGE (A) 08/16/2020 1858    Radiological Exams on Admission - Personally Reviewed: CT Head Wo Contrast  Result Date: 08/27/2020 CLINICAL DATA:  86 year old female with neck trauma. EXAM: CT HEAD WITHOUT CONTRAST CT CERVICAL SPINE WITHOUT CONTRAST TECHNIQUE: Multidetector CT imaging of the head and cervical spine was performed following the standard protocol without intravenous contrast. Multiplanar CT image reconstructions of the cervical spine were also generated. COMPARISON:  Cervical spine CT dated 08/16/2020. FINDINGS: CT HEAD FINDINGS Brain: Large bilateral hemispheric low attenuating subdural collections, likely subacute or chronic, but new since the prior CT of 02/17/2020 measuring up to 1 cm in thickness. There is high attenuating subdural blood over the left temporal and parietal lobes measuring approximately 9 mm in thickness consistent with acute bleed. There is associated mass effect on the brain parenchyma by the bilateral subdural collections. No midline shift. Vascular: No hyperdense vessel or unexpected calcification. Skull: Normal. Negative for fracture or focal lesion. Sinuses/Orbits: No acute finding. Other: None CT CERVICAL SPINE FINDINGS Alignment: No acute subluxation. Skull base and vertebrae: No acute fracture. Osteopenia. Probable small bone island in C6. Soft tissues and spinal canal: No prevertebral fluid or swelling. No visible canal hematoma. Disc levels: Multilevel degenerative changes with endplate irregularity and disc space narrowing. Upper chest: Negative. Other: Subcentimeter left thyroid hypodense nodule Not clinically significant; no  follow-up imaging recommended (ref: J Am Coll Radiol. 2015 Feb;12(2): 143-50). IMPRESSION: 1. Acute subdural hemorrhage over the left frontal and temporal lobe on subacute or chronic bilateral hemispheric subdural hemorrhages. Moderate mass effect on the brain parenchyma. No midline shift. 2. No acute/traumatic cervical spine pathology. These results were called by telephone at the time of interpretation on 08/27/2020 at 9:47 pm to provider ABIGAIL HARRIS , who verbally acknowledged these results. Electronically Signed   By: Anner Crete M.D.   On: 08/27/2020 21:50   CT Cervical Spine Wo Contrast  Result Date: 08/27/2020 CLINICAL DATA:  86 year old female with neck trauma. EXAM: CT HEAD WITHOUT CONTRAST CT CERVICAL SPINE WITHOUT CONTRAST TECHNIQUE: Multidetector CT imaging of the head and cervical spine was performed following the standard protocol without intravenous contrast. Multiplanar CT image reconstructions of the cervical spine were also generated. COMPARISON:  Cervical spine CT dated 08/16/2020. FINDINGS: CT HEAD FINDINGS Brain: Large bilateral hemispheric low attenuating subdural collections, likely subacute or chronic, but new since the prior CT of 02/17/2020 measuring up to 1 cm in thickness. There is high attenuating subdural blood over the left temporal and parietal lobes measuring approximately 9 mm in thickness consistent with acute bleed. There is associated mass effect on the brain parenchyma by the bilateral subdural collections. No midline shift. Vascular: No hyperdense vessel or unexpected calcification. Skull: Normal. Negative for fracture or focal lesion. Sinuses/Orbits: No acute finding. Other: None CT CERVICAL SPINE FINDINGS Alignment: No acute subluxation. Skull base and vertebrae: No acute fracture. Osteopenia. Probable small bone island in C6. Soft tissues and spinal canal: No prevertebral fluid or swelling. No visible canal hematoma. Disc levels: Multilevel degenerative changes  with endplate irregularity and disc space narrowing. Upper chest: Negative. Other: Subcentimeter left thyroid hypodense nodule Not clinically significant; no follow-up imaging recommended (ref: J Am Coll Radiol. 2015 Feb;12(2): 143-50). IMPRESSION: 1. Acute subdural hemorrhage over the left frontal and temporal lobe on subacute or chronic bilateral hemispheric subdural hemorrhages. Moderate mass effect on the brain parenchyma. No midline  shift. 2. No acute/traumatic cervical spine pathology. These results were called by telephone at the time of interpretation on 08/27/2020 at 9:47 pm to provider ABIGAIL HARRIS , who verbally acknowledged these results. Electronically Signed   By: Anner Crete M.D.   On: 08/27/2020 21:50   DG Pelvis Portable  Result Date: 08/27/2020 CLINICAL DATA:  Recent fall with pelvic pain, initial encounter EXAM: PORTABLE PELVIS 1-2 VIEWS COMPARISON:  08/16/2020 FINDINGS: Pelvic ring is intact. Degenerative changes of the hip joints and lumbar spine are seen. No acute fracture or dislocation is noted. IMPRESSION: No acute abnormality noted. Electronically Signed   By: Inez Catalina M.D.   On: 08/27/2020 20:46   DG Chest Port 1 View  Result Date: 08/27/2020 CLINICAL DATA:  Recent fall with chest pain, initial encounter EXAM: PORTABLE CHEST 1 VIEW COMPARISON:  08/16/2020 FINDINGS: Cardiac shadow is mildly enlarged but stable. Postsurgical changes are seen. Aortic calcifications are noted. Tortuous vasculature is noted in the superior mediastinum. The lungs are well aerated bilaterally. No focal infiltrate is seen. No acute bony abnormality is noted. IMPRESSION: No acute abnormality noted. Electronically Signed   By: Inez Catalina M.D.   On: 08/27/2020 20:45    Telemetry: Personally reviewed.  Rhythm is normal sinus rhythm with heart rate of 70 BPM.    Assessment/Plan Principal Problem:   Subdural hematoma (HCC)   CT imaging reveals evidence of left frontal and temporal lobe  acute appearing subdural hematomas in addition to bilateral hemispheric subacute hematomas with evidence of moderate midline shift.  Patient presented to [facility] emergency department 10/8 during which CT imaging of the head was performed at that time which revealed no evidence of hematoma.  [name] has already been administered considering patient's ongoing Eliquis use for atrial fibrillation.  Will abstain from further dosing of Eliquis going forward.  Case is already been discussed with Fenton Malling with neurosurgery who recommends overnight observation, serial neurologic checks and repeat CT imaging in the morning -neurosurgery states the patient would be a poor surgical candidate.  Place patient in observation in the progressive unit  Neurologic checks every 4 hours.  Per my discussion with family, they would be reluctant to proceed with any surgical intervention even if patient was a candidate.  Active Problems: Fall   Patient seems to have been found down behind her facility after an unwitnessed fall.  Cause of the fall is not clear.  Patient does appear to be clinically dehydrated however and therefore we will gently hydrate patient with intravenous isotonic fluids.  Chest x-ray reveals no evidence of pneumonia.  Telemetry reveals patient to be in normal sinus rhythm with stable blood pressures.  Obtaining urinalysis.  Ordering physical therapy assessment.  Paroxysmal atrial flutter   Diagnosed in early September after which patient was placed on home-going Eliquis 2.5 mg twice daily.  Will discontinue Eliquis therapy going forward  Patient is currently rate controlled on home regimen of atenolol  Monitoring patient on telemetry   Coronary artery disease involving native coronary artery of native heart without angina pectoris   Patient is currently chest pain-free  Continue home regimen of atenolol  Monitoring patient on telemetry    Essential  hypertension   Holding home regimen of hydrochlorothiazide, continuing home regimen of amlodipine.    Dementia with behavioral disturbance [facility])   Patient has longstanding known history of dementia  Minimizing uncomfortable stimuli and will redirect patient is much as possible    Hypokalemia, inadequate intake   Replacing  with oral potassium chloride  Obtaining magnesium level  Monitoring potassium levels with serial chemistries    Code Status:  DNR Family Communication: CODE STATUS and goals of care confirmed with family via phone conversation.  Status is: Observation  The patient remains OBS appropriate and will d/c before 2 midnights.  Dispo: The patient is from: SNF              Anticipated d/c is to: SNF              Anticipated d/c date is: 2 days              Patient currently is not medically stable to d/c.        Vernelle Emerald MD [facility] Pager [phone]  If 7PM-7AM, please contact night-coverage [email or web address] Use universal [place] password for that web site. If you do not have the password, please call the hospital operator.  08/28/2020, 2:43 AM

## 2020-08-28 NOTE — ED Notes (Signed)
Ordered breakfast 

## 2020-08-28 NOTE — Progress Notes (Signed)
PT Cancellation Note  Patient Details Name: Marie Burch MRN: [id number] DOB: 10-26-1934   Cancelled Treatment:    Reason Eval/Treat Not Completed: Other (comment) Per RN, pt is finally resting after being restless all morning. Requesting to hold PT. Will follow up as pt appropriate and as schedule allows.   Lou Miner, DPT  Acute Rehabilitation Services  Pager: [phone] Office: [phone]    [name] 08/28/2020, 9:29 AM

## 2020-08-28 NOTE — ED Notes (Signed)
Pt remains confused to place but is reoriented easily, mittens are off , daughter updated

## 2020-08-28 NOTE — ED Notes (Signed)
Incontinent pad changed and brief changed.

## 2020-08-29 DIAGNOSIS — Z515 Encounter for palliative care: Secondary | ICD-10-CM | POA: Diagnosis not present

## 2020-08-29 DIAGNOSIS — E860 Dehydration: Secondary | ICD-10-CM | POA: Diagnosis present

## 2020-08-29 DIAGNOSIS — I2510 Atherosclerotic heart disease of native coronary artery without angina pectoris: Secondary | ICD-10-CM | POA: Diagnosis present

## 2020-08-29 DIAGNOSIS — K219 Gastro-esophageal reflux disease without esophagitis: Secondary | ICD-10-CM | POA: Diagnosis present

## 2020-08-29 DIAGNOSIS — Z66 Do not resuscitate: Secondary | ICD-10-CM | POA: Diagnosis present

## 2020-08-29 DIAGNOSIS — Z20822 Contact with and (suspected) exposure to covid-19: Secondary | ICD-10-CM | POA: Diagnosis present

## 2020-08-29 DIAGNOSIS — E876 Hypokalemia: Secondary | ICD-10-CM | POA: Diagnosis present

## 2020-08-29 DIAGNOSIS — Z951 Presence of aortocoronary bypass graft: Secondary | ICD-10-CM | POA: Diagnosis not present

## 2020-08-29 DIAGNOSIS — Y92128 Other place in nursing home as the place of occurrence of the external cause: Secondary | ICD-10-CM | POA: Diagnosis not present

## 2020-08-29 DIAGNOSIS — Z905 Acquired absence of kidney: Secondary | ICD-10-CM | POA: Diagnosis not present

## 2020-08-29 DIAGNOSIS — S51811A Laceration without foreign body of right forearm, initial encounter: Secondary | ICD-10-CM | POA: Diagnosis present

## 2020-08-29 DIAGNOSIS — Z90710 Acquired absence of both cervix and uterus: Secondary | ICD-10-CM | POA: Diagnosis not present

## 2020-08-29 DIAGNOSIS — I10 Essential (primary) hypertension: Secondary | ICD-10-CM | POA: Diagnosis present

## 2020-08-29 DIAGNOSIS — R296 Repeated falls: Secondary | ICD-10-CM | POA: Diagnosis present

## 2020-08-29 DIAGNOSIS — Z923 Personal history of irradiation: Secondary | ICD-10-CM | POA: Diagnosis not present

## 2020-08-29 DIAGNOSIS — Z85038 Personal history of other malignant neoplasm of large intestine: Secondary | ICD-10-CM | POA: Diagnosis not present

## 2020-08-29 DIAGNOSIS — Z7983 Long term (current) use of bisphosphonates: Secondary | ICD-10-CM | POA: Diagnosis not present

## 2020-08-29 DIAGNOSIS — Z79899 Other long term (current) drug therapy: Secondary | ICD-10-CM | POA: Diagnosis not present

## 2020-08-29 DIAGNOSIS — I4892 Unspecified atrial flutter: Secondary | ICD-10-CM | POA: Diagnosis present

## 2020-08-29 DIAGNOSIS — F32A Depression, unspecified: Secondary | ICD-10-CM | POA: Diagnosis present

## 2020-08-29 DIAGNOSIS — I480 Paroxysmal atrial fibrillation: Secondary | ICD-10-CM | POA: Diagnosis present

## 2020-08-29 DIAGNOSIS — S065X9A Traumatic subdural hemorrhage with loss of consciousness of unspecified duration, initial encounter: Secondary | ICD-10-CM | POA: Diagnosis present

## 2020-08-29 DIAGNOSIS — F0281 Dementia in other diseases classified elsewhere with behavioral disturbance: Secondary | ICD-10-CM | POA: Diagnosis present

## 2020-08-29 DIAGNOSIS — Z9049 Acquired absence of other specified parts of digestive tract: Secondary | ICD-10-CM | POA: Diagnosis not present

## 2020-08-29 DIAGNOSIS — W1830XA Fall on same level, unspecified, initial encounter: Secondary | ICD-10-CM | POA: Diagnosis present

## 2020-08-29 DIAGNOSIS — E782 Mixed hyperlipidemia: Secondary | ICD-10-CM | POA: Diagnosis present

## 2020-08-29 DIAGNOSIS — Z7189 Other specified counseling: Secondary | ICD-10-CM | POA: Diagnosis not present

## 2020-08-29 DIAGNOSIS — Z7901 Long term (current) use of anticoagulants: Secondary | ICD-10-CM | POA: Diagnosis not present

## 2020-08-29 LAB — URINE CULTURE

## 2020-08-29 LAB — BASIC METABOLIC PANEL
Anion gap: 11 (ref 5–15)
BUN: 10 mg/dL (ref 8–23)
CO2: 27 mmol/L (ref 22–32)
Calcium: 9 mg/dL (ref 8.9–10.3)
Chloride: 103 mmol/L (ref 98–111)
Creatinine, Ser: 1.05 mg/dL — ABNORMAL HIGH (ref 0.44–1.00)
GFR, Estimated: 52 mL/min — ABNORMAL LOW (ref >60)
Glucose, Bld: 111 mg/dL — ABNORMAL HIGH (ref 70–99)
Potassium: 3.9 mmol/L (ref 3.5–5.1)
Sodium: 141 mmol/L (ref 135–145)

## 2020-08-29 LAB — CBC
HCT: 46 % (ref 36.0–46.0)
Hemoglobin: 15 g/dL (ref 12.0–15.0)
MCH: 28.5 pg (ref 26.0–34.0)
MCHC: 32.6 g/dL (ref 30.0–36.0)
MCV: 87.5 fL (ref 80.0–100.0)
Platelets: 197 10*3/uL (ref 150–400)
RBC: 5.26 MIL/uL — ABNORMAL HIGH (ref 3.87–5.11)
RDW: 14.2 % (ref 11.5–15.5)
WBC: 7.5 10*3/uL (ref 4.0–10.5)
nRBC: 0 % (ref 0.0–0.2)

## 2020-08-29 LAB — PHOSPHORUS: Phosphorus: 3.5 mg/dL (ref 2.5–4.6)

## 2020-08-29 LAB — MRSA PCR SCREENING: MRSA by PCR: NEGATIVE

## 2020-08-29 LAB — MAGNESIUM: Magnesium: 2.2 mg/dL (ref 1.7–2.4)

## 2020-08-29 NOTE — NC FL2 (Signed)
[place] MEDICAID FL2 LEVEL OF CARE SCREENING TOOL     IDENTIFICATION  Patient Name: Marie Burch Birthdate: Feb 20, 1934 Sex: female Admission Date (Current Location): 08/27/2020  [facility] [place] Number:  Herbalist and Address:  The . [facility], [place] [address] [place], [place] [address]      Provider Number: [id number]  Attending Physician Name and Address:  Shawna Clamp, MD  Relative Name and Phone Number:  [name] [phone]    Current Level of Care: Hospital Recommended Level of Care: [place] Prior Approval Number:    Date Approved/Denied:   PASRR Number: [id number]  Discharge Plan: SNF    Current Diagnoses: Patient Active Problem List   Diagnosis Date Noted  . Subdural hematoma ([place]) 08/28/2020  . Mixed hyperlipidemia 08/28/2020  . Hypokalemia, inadequate intake 08/28/2020  . Hx of CABG 07/12/2020  . Dementia with behavioral disturbance ([place]) 07/12/2020  . Pressure injury of skin 07/12/2020  . UTI (urinary tract infection) 07/12/2020  . Atrial flutter ([place]) 07/11/2020  . Paroxysmal atrial flutter ([place]) 07/10/2020  . S/P mitral valve repair 06/12/2014  . Coronary artery disease involving native coronary artery of native heart without angina pectoris 06/12/2014  . Essential hypertension 06/12/2014  . History of PSVT (paroxysmal supraventricular tachycardia) 06/12/2014  . Colon cancer ([place]) 08/29/2012    Orientation RESPIRATION BLADDER Height & Weight     Self  Normal Continent, External catheter (External Urinary Catheter) Weight: 121 lb 4.1 oz (55 kg) Height:  5\' 2"  (157.5 cm)  BEHAVIORAL SYMPTOMS/MOOD NEUROLOGICAL BOWEL NUTRITION STATUS      Continent (WDL) Diet (See Discharge Summary)  AMBULATORY STATUS COMMUNICATION OF NEEDS Skin     Verbally Other (Comment), Skin abrasions (Abrasion arm right medial;Ecchymosis Head,Neck Right,PI Coccyx Medial;Right;Left;Stage 1)                        Personal Care Assistance Level of Assistance  Bathing, Feeding, Dressing Bathing Assistance: Limited assistance Feeding assistance: Limited assistance (Needs set up; Cardiac) Dressing Assistance: Limited assistance     Functional Limitations Info  Sight, Hearing, Speech Sight Info: Impaired Hearing Info: Adequate Speech Info: Adequate    SPECIAL CARE FACTORS FREQUENCY  PT (By licensed PT), OT (By licensed OT)     PT Frequency: 5x min weekly OT Frequency: 5x min weekly            Contractures Contractures Info: Not present    Additional Factors Info  Code Status, Allergies, Psychotropic Code Status Info: DNR Allergies Info: Penicillins,Levaquin,Niacin And Related,Norvasc,Novocain ,Pravachol,Statins,Sulfa Antibiotics,Zetia Psychotropic Info: clonazePAM (KLONOPIN) tablet 0.5 mg,sertraline (ZOLOFT) tablet 100 mg         Current Medications (08/29/2020):  This is the current hospital active medication list Current Facility-Administered Medications  Medication Dose Route Frequency Provider Last Rate Last Admin  . acetaminophen (TYLENOL) tablet 650 mg  650 mg Oral Q6H PRN Shalhoub, Sherryll Burger, MD       Or  . acetaminophen (TYLENOL) suppository 650 mg  650 mg Rectal Q6H PRN Shalhoub, Sherryll Burger, MD      . amiodarone (PACERONE) tablet 200 mg  200 mg Oral Daily Shalhoub, Sherryll Burger, MD   200 mg at 08/29/20 0902  . atenolol (TENORMIN) tablet 25 mg  25 mg Oral q morning - 10a Shalhoub, Sherryll Burger, MD   25 mg at 08/29/20 0902  . clonazePAM (KLONOPIN) tablet 0.5 mg  0.5 mg Oral QHS Shalhoub, Sherryll Burger, MD  0.5 mg at 08/29/20 0034  . nitroGLYCERIN (NITROSTAT) SL tablet 0.4 mg  0.4 mg Sublingual Q5 min PRN Shalhoub, Sherryll Burger, MD      . ondansetron [facility]) tablet 4 mg  4 mg Oral Q6H PRN Shalhoub, Sherryll Burger, MD       Or  . ondansetron (ZOFRAN) injection 4 mg  4 mg Intravenous Q6H PRN Shalhoub, Sherryll Burger, MD      . polyethylene glycol (MIRALAX / GLYCOLAX) packet 17 g  17 g Oral Daily PRN  Shalhoub, Sherryll Burger, MD      . sertraline (ZOLOFT) tablet 100 mg  100 mg Oral Daily Shalhoub, Sherryll Burger, MD   100 mg at 08/29/20 0902     Discharge Medications: Please see discharge summary for a list of discharge medications.  Relevant Imaging Results:  Relevant Lab Results:   Additional Information [id number]  Trula Ore, LCSWA

## 2020-08-29 NOTE — Care Management Obs Status (Signed)
[place] NOTIFICATION   Patient Details  Name: Marie Burch MRN: [id number] Date of Birth: [birth year]   Medicare Observation Status Notification Given:  Yes    Bethena Roys, RN 08/29/2020, 12:13 PM

## 2020-08-29 NOTE — Progress Notes (Signed)
PROGRESS NOTE    Marie Burch  FUX:[id number] DOB: [birth year] DOA: 08/27/2020 PCP: Lajean Manes, MD   Brief Narrative: This 86 years old female with PMH of coronary artery disease, hypertension, advanced dementia,  paroxysmal A. fib, hyperlipidemia, presents to the emergency department status post unwitnessed fall.  Patient was found down for an unknown duration of time behind her nursing home facility.  Patient did not suffer any obvious head trauma.  Patient was recently started on Eliquis therapy for paroxysmal A. fib.  CT head shows subdural hemorrhage over the left frontal and temporal lobes.  Neurosurgery was consulted recommended neurochecks and repeat CT in 24 hours.  Due to patient's Eliquis use Kcentra was administered.  Eliquis is on hold.  Repeat CT scan shows a stable hematomas.  Neurosurgery states patient would be a poor surgical candidate. PT recommended SNF.  Assessment & Plan:   Principal Problem:   Subdural hematoma (HCC) Active Problems:   Coronary artery disease involving native coronary artery of native heart without angina pectoris   Essential hypertension   Paroxysmal atrial flutter (HCC)   Dementia with behavioral disturbance (HCC)   Mixed hyperlipidemia   Hypokalemia, inadequate intake   Subdural hematoma [facility]):    CT imaging reveals evidence of left frontal and temporal lobe acute appearing subdural hematomas with evidence of moderate midline shift.  Patient [facility] to [facility] emergency department 10/8 during which CT imaging of the head was performed at that time which revealed no evidence of hematoma.  [name] has already been administered considering patient's ongoing Eliquis use for atrial fibrillation.  Will hold further dosing of Eliquis going forward.  Neurosurgery consulted, who recommends close monitoring , serial neurologic checks and repeat CT imaging in the morning -neurosurgery states the patient would be a poor  surgical candidate.  Neurologic checks every 4 hours.  Family would be reluctant to proceed with any surgical intervention even if patient was a candidate.    Recurrent Falls:    Patient seems to have been found down behind her facility after an unwitnessed fall.  Cause of the fall is not clear.  Patient does appear to be clinically dehydrated however and therefore we will gently hydrate patient with intravenous isotonic fluids.  Chest x-ray reveals no evidence of pneumonia.  Telemetry reveals patient to be in normal sinus rhythm with stable blood pressures.   UA: unremarkable.  PT recommended SNF.  Paroxysmal atrial flutter:   Diagnosed in early September after which patient was placed on home-going Eliquis 2.5 mg twice daily.  Will discontinue Eliquis therapy going forward  Patient is currently rate controlled on home regimen of atenolol  Monitoring patient on telemetry   Coronary artery disease involving native coronary artery of native heart without angina pectoris   Patient is currently chest pain-free.  Continue home regimen of atenolol  Monitoring patient on telemetry    Essential hypertension   Holding home regimen of hydrochlorothiazide, continuing home regimen of amlodipine.    Dementia with behavioral disturbance [facility])   Patient has longstanding known history of dementia  Minimizing uncomfortable stimuli and will redirect patient is much as possible    Hypokalemia, inadequate intake.   Replacing with oral potassium chloride  Improved.     DVT prophylaxis:  SCDS Code Status:DNR Family Communication: No one at bed side. Disposition Plan:  Status is: In patient   Dispo: The patient is from: SNF              Anticipated d/c  is to: SNF              Anticipated d/c date is: 1 day              Patient currently is not medically stable to d/c.   Consultants:   Neurosyrgery  Procedures:  None Antimicrobials:    Anti-infectives (From admission, onward)   None      Subjective: Patient was seen and examined at bedside.  Overnight events noted.  Patient is more alert and awake following commands.  She was sitting on the chair and having breakfast.  Objective: Vitals:   08/29/20 0012 08/29/20 0900 08/29/20 0902 08/29/20 1151  BP: (!) 148/74 129/70  118/71  Pulse:   65 65  Resp: 16 12  15   Temp: 98.4 F (36.9 C) 98.6 F (37 C)  98.5 F (36.9 C)  TempSrc: Oral Oral  Oral  SpO2: 93%     Weight:      Height:        Intake/Output Summary (Last 24 hours) at 08/29/2020 1641 Last data filed at 08/29/2020 0900 Gross per 24 hour  Intake 100 ml  Output 300 ml  Net -200 ml   Filed Weights   08/27/20 2019  Weight: 55 kg    Examination:  General exam: Appears calm and comfortable  Respiratory system: Clear to auscultation. Respiratory effort normal. Cardiovascular system: S1 & S2 heard, RRR. No JVD, murmurs, rubs, gallops or clicks. No pedal edema. Gastrointestinal system: Abdomen is nondistended, soft and nontender. No organomegaly or masses felt. Normal bowel sounds heard. Central nervous system: Alert and oriented. No focal neurological deficits. Extremities: No edema, no cyanosis, no clubbing Skin: No rashes, lesions or ulcers Psychiatry: Judgement and insight appear normal. Mood & affect appropriate.     Data Reviewed: I have personally reviewed following labs and imaging studies  CBC: Recent Labs  Lab 08/27/20 2035 08/28/20 0449 08/29/20 0915  WBC 8.4 7.6 7.5  NEUTROABS 5.6 4.8  --   HGB 15.3* 14.5 15.0  HCT 47.2* 45.3 46.0  MCV 86.4 88.3 87.5  PLT 197 182 119   Basic Metabolic Panel: Recent Labs  Lab 08/27/20 2035 08/28/20 0449 08/29/20 0915  NA 136 141 141  K 3.0* 3.1* 3.9  CL 96* 102 103  CO2 27 28 27   GLUCOSE 140* 119* 111*  BUN 18 11 10   CREATININE 1.26* 1.03* 1.05*  CALCIUM 9.3 8.9 9.0  MG  --  1.6* 2.2  PHOS  --   --  3.5   GFR: Estimated  Creatinine Clearance: 31.5 mL/min (A) (by C-G formula based on SCr of 1.05 mg/dL (H)). Liver Function Tests: Recent Labs  Lab 08/28/20 0449  AST 22  22  ALT 17  15  ALKPHOS 38  38  BILITOT 0.6  0.6  PROT 6.3*  6.3*  ALBUMIN 3.6  3.5   No results for input(s): LIPASE, AMYLASE in the last 168 hours. No results for input(s): AMMONIA in the last 168 hours. Coagulation Profile: Recent Labs  Lab 08/28/20 0449  INR 1.1   Cardiac Enzymes: Recent Labs  Lab 08/27/20 2035  CKTOTAL 49   BNP (last 3 results) No results for input(s): PROBNP in the last 8760 hours. HbA1C: No results for input(s): HGBA1C in the last 72 hours. CBG: No results for input(s): GLUCAP in the last 168 hours. Lipid Profile: No results for input(s): CHOL, HDL, LDLCALC, TRIG, CHOLHDL, LDLDIRECT in the last 72 hours. Thyroid Function Tests: No results for  input(s): TSH, T4TOTAL, FREET4, T3FREE, THYROIDAB in the last 72 hours. Anemia Panel: No results for input(s): VITAMINB12, FOLATE, FERRITIN, TIBC, IRON, RETICCTPCT in the last 72 hours. Sepsis Labs: No results for input(s): PROCALCITON, LATICACIDVEN in the last 168 hours.  Recent Results (from the past 240 hour(s))  Respiratory Panel by RT PCR (Flu A&B, Covid) - Nasopharyngeal Swab     Status: None   Collection Time: 08/27/20  9:09 PM   Specimen: Nasopharyngeal Swab  Result Value Ref Range Status   SARS Coronavirus 2 by RT PCR NEGATIVE NEGATIVE Final    Comment: (NOTE) SARS-CoV-2 target nucleic acids are NOT DETECTED.  The SARS-CoV-2 RNA is generally detectable in upper respiratoy specimens during the acute phase of infection. The lowest concentration of SARS-CoV-2 viral copies this assay can detect is 131 copies/mL. A negative result does not preclude SARS-Cov-2 infection and should not be used as the sole basis for treatment or other patient management decisions. A negative result may occur with  improper specimen collection/handling, submission  of specimen other than nasopharyngeal swab, presence of viral mutation(s) within the areas targeted by this assay, and inadequate number of viral copies (<131 copies/mL). A negative result must be combined with clinical observations, patient history, and epidemiological information. The expected result is Negative.  Fact Sheet for Patients:  PinkCheek.be  Fact Sheet for Healthcare Providers:  GravelBags.it  This test is no t yet approved or cleared by the [place] FDA and  has been authorized for detection and/or diagnosis of SARS-CoV-2 by FDA under an Emergency Use Authorization (EUA). This EUA will remain  in effect (meaning this test can be used) for the duration of the COVID-19 declaration under Section 564(b)(1) of the Act, 21 U.S.C. section 360bbb-3(b)(1), unless the authorization is terminated or revoked sooner.     Influenza A by PCR NEGATIVE NEGATIVE Final   Influenza B by PCR NEGATIVE NEGATIVE Final    Comment: (NOTE) The Xpert Xpress SARS-CoV-2/FLU/RSV assay is intended as an aid in  the diagnosis of influenza from Nasopharyngeal swab specimens and  should not be used as a sole basis for treatment. Nasal washings and  aspirates are unacceptable for Xpert Xpress SARS-CoV-2/FLU/RSV  testing.  Fact Sheet for Patients: PinkCheek.be  Fact Sheet for Healthcare Providers: GravelBags.it  This test is not yet approved or cleared by the [place] FDA and  has been authorized for detection and/or diagnosis of SARS-CoV-2 by  FDA under an Emergency Use Authorization (EUA). This EUA will remain  in effect (meaning this test can be used) for the duration of the  Covid-19 declaration under Section 564(b)(1) of the Act, 21  U.S.C. section 360bbb-3(b)(1), unless the authorization is  terminated or revoked. Performed at [facility], [place] [address] [place], [place] [address]   Culture, Urine     Status: Abnormal   Collection Time: 08/28/20  2:46 AM   Specimen: Urine, Random  Result Value Ref Range Status   Specimen Description URINE, RANDOM  Final   Special Requests   Final    NONE Performed at [facility], [place] [address], [place], [place] [address]    Culture MULTIPLE SPECIES PRESENT, SUGGEST RECOLLECTION (A)  Final   Report Status 08/29/2020 FINAL  Final  MRSA PCR Screening     Status: None   Collection Time: 08/29/20 12:37 AM   Specimen: Nasopharyngeal  Result Value Ref Range Status   MRSA by PCR NEGATIVE NEGATIVE Final    Comment:  The GeneXpert MRSA Assay (FDA approved for NASAL specimens only), is one component of a comprehensive MRSA colonization surveillance program. It is not intended to diagnose MRSA infection nor to guide or monitor treatment for MRSA infections. Performed at [facility], [place] [address] [place], [place] [address]     Radiology Studies: CT HEAD WO CONTRAST  Result Date: 08/28/2020 CLINICAL DATA:  Follow-up subdural hematoma EXAM: CT HEAD WITHOUT CONTRAST TECHNIQUE: Contiguous axial images were obtained from the base of the skull through the vertex without intravenous contrast. COMPARISON:  Yesterday FINDINGS: Brain: Bilateral subdural collection which is primarily low-density but also contains high-density clot on the left. Left-sided high-density clot measures up to 11 mm in thickness along the temporal convexity. Low-density components measure up to 13 mm at the left frontal convexity and 10 mm in thickness at the right frontal convexity. These cause cortical mass effect. No infarct, hydrocephalus, or masslike finding. Chronic small vessel disease in the cerebral white matter Vascular: No hyperdense vessel or unexpected calcification. Skull: Normal. Negative for fracture or focal lesion. Sinuses/Orbits: Bilateral cataract resection IMPRESSION: 1. Size stable high-density  subdural hematoma on the left, up to 11 mm in thickness. 2. Bilateral low-density subdural collections are also unchanged, up to 13 mm in thickness on the left. Electronically Signed   By: Monte Fantasia M.D.   On: 08/28/2020 07:44   CT Head Wo Contrast  Result Date: 08/27/2020 CLINICAL DATA:  86 year old female with neck trauma. EXAM: CT HEAD WITHOUT CONTRAST CT CERVICAL SPINE WITHOUT CONTRAST TECHNIQUE: Multidetector CT imaging of the head and cervical spine was performed following the standard protocol without intravenous contrast. Multiplanar CT image reconstructions of the cervical spine were also generated. COMPARISON:  Cervical spine CT dated 08/16/2020. FINDINGS: CT HEAD FINDINGS Brain: Large bilateral hemispheric low attenuating subdural collections, likely subacute or chronic, but new since the prior CT of 02/17/2020 measuring up to 1 cm in thickness. There is high attenuating subdural blood over the left temporal and parietal lobes measuring approximately 9 mm in thickness consistent with acute bleed. There is associated mass effect on the brain parenchyma by the bilateral subdural collections. No midline shift. Vascular: No hyperdense vessel or unexpected calcification. Skull: Normal. Negative for fracture or focal lesion. Sinuses/Orbits: No acute finding. Other: None CT CERVICAL SPINE FINDINGS Alignment: No acute subluxation. Skull base and vertebrae: No acute fracture. Osteopenia. Probable small bone island in C6. Soft tissues and spinal canal: No prevertebral fluid or swelling. No visible canal hematoma. Disc levels: Multilevel degenerative changes with endplate irregularity and disc space narrowing. Upper chest: Negative. Other: Subcentimeter left thyroid hypodense nodule Not clinically significant; no follow-up imaging recommended (ref: J Am Coll Radiol. 2015 Feb;12(2): 143-50). IMPRESSION: 1. Acute subdural hemorrhage over the left frontal and temporal lobe on subacute or chronic bilateral  hemispheric subdural hemorrhages. Moderate mass effect on the brain parenchyma. No midline shift. 2. No acute/traumatic cervical spine pathology. These results were called by telephone at the time of interpretation on 08/27/2020 at 9:47 pm to provider ABIGAIL HARRIS , who verbally acknowledged these results. Electronically Signed   By: Anner Crete M.D.   On: 08/27/2020 21:50   CT Cervical Spine Wo Contrast  Result Date: 08/27/2020 CLINICAL DATA:  86 year old female with neck trauma. EXAM: CT HEAD WITHOUT CONTRAST CT CERVICAL SPINE WITHOUT CONTRAST TECHNIQUE: Multidetector CT imaging of the head and cervical spine was performed following the standard protocol without intravenous contrast. Multiplanar CT image reconstructions of the cervical spine were also  generated. COMPARISON:  Cervical spine CT dated 08/16/2020. FINDINGS: CT HEAD FINDINGS Brain: Large bilateral hemispheric low attenuating subdural collections, likely subacute or chronic, but new since the prior CT of 02/17/2020 measuring up to 1 cm in thickness. There is high attenuating subdural blood over the left temporal and parietal lobes measuring approximately 9 mm in thickness consistent with acute bleed. There is associated mass effect on the brain parenchyma by the bilateral subdural collections. No midline shift. Vascular: No hyperdense vessel or unexpected calcification. Skull: Normal. Negative for fracture or focal lesion. Sinuses/Orbits: No acute finding. Other: None CT CERVICAL SPINE FINDINGS Alignment: No acute subluxation. Skull base and vertebrae: No acute fracture. Osteopenia. Probable small bone island in C6. Soft tissues and spinal canal: No prevertebral fluid or swelling. No visible canal hematoma. Disc levels: Multilevel degenerative changes with endplate irregularity and disc space narrowing. Upper chest: Negative. Other: Subcentimeter left thyroid hypodense nodule Not clinically significant; no follow-up imaging recommended (ref:  J Am Coll Radiol. 2015 Feb;12(2): 143-50). IMPRESSION: 1. Acute subdural hemorrhage over the left frontal and temporal lobe on subacute or chronic bilateral hemispheric subdural hemorrhages. Moderate mass effect on the brain parenchyma. No midline shift. 2. No acute/traumatic cervical spine pathology. These results were called by telephone at the time of interpretation on 08/27/2020 at 9:47 pm to provider ABIGAIL HARRIS , who verbally acknowledged these results. Electronically Signed   By: Anner Crete M.D.   On: 08/27/2020 21:50   DG Pelvis Portable  Result Date: 08/27/2020 CLINICAL DATA:  Recent fall with pelvic pain, initial encounter EXAM: PORTABLE PELVIS 1-2 VIEWS COMPARISON:  08/16/2020 FINDINGS: Pelvic ring is intact. Degenerative changes of the hip joints and lumbar spine are seen. No acute fracture or dislocation is noted. IMPRESSION: No acute abnormality noted. Electronically Signed   By: Inez Catalina M.D.   On: 08/27/2020 20:46   DG Chest Port 1 View  Result Date: 08/27/2020 CLINICAL DATA:  Recent fall with chest pain, initial encounter EXAM: PORTABLE CHEST 1 VIEW COMPARISON:  08/16/2020 FINDINGS: Cardiac shadow is mildly enlarged but stable. Postsurgical changes are seen. Aortic calcifications are noted. Tortuous vasculature is noted in the superior mediastinum. The lungs are well aerated bilaterally. No focal infiltrate is seen. No acute bony abnormality is noted. IMPRESSION: No acute abnormality noted. Electronically Signed   By: Inez Catalina M.D.   On: 08/27/2020 20:45    Scheduled Meds: . amiodarone  200 mg Oral Daily  . atenolol  25 mg Oral q morning - 10a  . clonazePAM  0.5 mg Oral QHS  . sertraline  100 mg Oral Daily   Continuous Infusions:   LOS: 0 days    Time spent: 35 mins.    Shawna Clamp, MD [facility]   If 7PM-7AM, please contact night-coverage

## 2020-08-29 NOTE — TOC CAGE-AID Note (Signed)
Transition of Care [facility]) - CAGE-AID Screening   Patient Details  Name: Marie Burch MRN: [id number] Date of Birth: September 26, 1934  [facility] CM/SW Contact:    Emeterio Reeve, [place] Phone Number: 08/29/2020, 4:33 PM   Clinical Narrative:  CSW met with pt at bedside. CSW introduced self and explained role at the hospital.  Pt denies alcohol use. Pt denies substance use. Pt did not need any resources at this time.    CAGE-AID Screening:    Have You Ever Felt You Ought to Cut Down on Your Drinking or Drug Use?: No Have People Annoyed You By Critizing Your Drinking Or Drug Use?: No Have You Felt Bad Or Guilty About Your Drinking Or Drug Use?: No Have You Ever Had a Drink or Used Drugs First Thing In The Morning to Steady Your Nerves or to Get Rid of a Hangover?: No CAGE-AID Score: 0  Substance Abuse Education Offered: Yes     Blima Ledger, [place] Social Worker [phone]

## 2020-08-29 NOTE — TOC Progression Note (Signed)
Transition of Care [facility]) - Progression Note    Patient Details  Name: Marie Burch MRN: [id number] Date of Birth: October 31, 1934  Transition of Care [facility]) CM/SW [place], [place] Phone Number: 08/29/2020, 2:13 PM  Clinical Narrative:     Update 10/21 2:11pm- CSW started insurance authorization for patient.  Pending bed offers. Pending insurance authorization.  CSW will continue to follow.     Expected Discharge Plan: [place] Barriers to Discharge: Continued Medical Work up  Expected Discharge Plan and Services Expected Discharge Plan: [place] arrangements for the past 2 months: [place]                                       Social Determinants of Health (SDOH) Interventions    Readmission Risk Interventions No flowsheet data found.

## 2020-08-29 NOTE — Evaluation (Signed)
Physical Therapy Evaluation Patient Details Name: Marie Burch MRN: [id number] DOB: [birth year] Today's Date: 08/29/2020   History of Present Illness  Pt is a 86 y.o. F with significant PMH of CAD, HTN, advanced dementia, paroxysmal A. fib, who presents after a fall at SNF. CT head showing subdural hemorrhage over left frontal and temporal lobes.  Clinical Impression  Prior to admission, pt resides at [facility]; she states she is uses a walker for ambulation. Pt with history of dementia and is oriented to self only; however, she follows one step commands fairly well. Pt ambulating 10 feet with a walker and two person minimal assist. BP 118/71 post mobility. No discrepancies noted in strength with right vs left side, likely fairly close to her functional baseline. Pt displays decreased cognition, balance deficits, and generalized weakness. Recommending return to SNF upon discharge.     Follow Up Recommendations SNF    Equipment Recommendations  None recommended by PT    Recommendations for Other Services       Precautions / Restrictions Precautions Precautions: Fall Restrictions Weight Bearing Restrictions: No      Mobility  Bed Mobility Overal bed mobility: Needs Assistance Bed Mobility: Supine to Sit     Supine to sit: Min assist     General bed mobility comments: Pt bringing trunk into long sitting, cues for progressing legs over to edge of bed, minA to scoot hip out with bed pad    Transfers Overall transfer level: Needs assistance Equipment used: Rolling walker (2 wheeled) Transfers: Sit to/from Stand Sit to Stand: Min assist         General transfer comment: MinA to rise to stand from edge of bed, pt bracing legs against bed  Ambulation/Gait Ambulation/Gait assistance: Min assist;+2 physical assistance Gait Distance (Feet): 10 Feet Assistive device: Rolling walker (2 wheeled) Gait Pattern/deviations: Step-through pattern;Shuffle;Trunk flexed;Decreased stride  length;Decreased dorsiflexion - right;Decreased dorsiflexion - left Gait velocity: decreased Gait velocity interpretation: <1.31 ft/sec, indicative of household ambulator General Gait Details: Pt requiring minA + 2 for stability and manual assist for walker negotiation in order to maintain a straight path and assist with turns. Cues for sequencing/direction. Pt with shuffle type gait pattern and decreased bilateral foot clearance. Poor proximity to walker  Stairs            Wheelchair Mobility    Modified Rankin (Stroke Patients Only) Modified Rankin (Stroke Patients Only) Pre-Morbid Rankin Score: Moderately severe disability Modified Rankin: Moderately severe disability     Balance Overall balance assessment: Needs assistance Sitting-balance support: Feet supported Sitting balance-Leahy Scale: Fair     Standing balance support: Bilateral upper extremity supported Standing balance-Leahy Scale: Poor                               Pertinent Vitals/Pain Pain Assessment: Faces Faces Pain Scale: No hurt    Home Living Family/patient expects to be discharged to:: Skilled nursing facility                      Prior Function Level of Independence: Needs assistance   Gait / Transfers Assistance Needed: Using walker  ADL's / Homemaking Assistance Needed: Likely requiring assist for ADL's, pt reporting "she helps some."        Hand Dominance        Extremity/Trunk Assessment   Upper Extremity Assessment Upper Extremity Assessment: RUE deficits/detail;LUE deficits/detail RUE Deficits / Details: Shoulder flexion AROM  to 90 degrees LUE Deficits / Details: Shoulder flexion AROM to 90 degrees    Lower Extremity Assessment Lower Extremity Assessment: RLE deficits/detail;LLE deficits/detail RLE Deficits / Details: Grossly 4/5 LLE Deficits / Details: Grossly 4/5    Cervical / Trunk Assessment Cervical / Trunk Assessment: Kyphotic  Communication    Communication: No difficulties  Cognition Arousal/Alertness: Awake/alert Behavior During Therapy: WFL for tasks assessed/performed Overall Cognitive Status: History of cognitive impairments - at baseline                                 General Comments: History of dementia. Pt oriented to self only. able to state she was in a hospital but thought she was in [place]. Following majority of 1 step commands      General Comments      Exercises     Assessment/Plan    PT Assessment Patient needs continued PT services  PT Problem List Decreased strength;Decreased activity tolerance;Decreased balance;Decreased mobility;Decreased cognition;Decreased safety awareness       PT Treatment Interventions DME instruction;Gait training;Functional mobility training;Therapeutic activities;Therapeutic exercise;Balance training;Patient/family education    PT Goals (Current goals can be found in the Care Plan section)  Acute Rehab PT Goals Patient Stated Goal: did not state PT Goal Formulation: With patient Time For Goal Achievement: 09/12/20 Potential to Achieve Goals: Fair    Frequency Min 2X/week   Barriers to discharge        Co-evaluation               AM-PAC PT "6 Clicks" Mobility  Outcome Measure Help needed turning from your back to your side while in a flat bed without using bedrails?: A Little Help needed moving from lying on your back to sitting on the side of a flat bed without using bedrails?: A Little Help needed moving to and from a bed to a chair (including a wheelchair)?: A Little Help needed standing up from a chair using your arms (e.g., wheelchair or bedside chair)?: A Little Help needed to walk in hospital room?: A Little Help needed climbing 3-5 steps with a railing? : Total 6 Click Score: 16    End of Session Equipment Utilized During Treatment: Gait belt Activity Tolerance: Patient tolerated treatment well Patient left: in chair;with call  bell/phone within reach;with chair alarm set Nurse Communication: Mobility status PT Visit Diagnosis: Unsteadiness on feet (R26.81);Other abnormalities of gait and mobility (R26.89);Muscle weakness (generalized) (M62.81);Difficulty in walking, not elsewhere classified (R26.2)    Time: 8325-4982 PT Time Calculation (min) (ACUTE ONLY): 19 min   Charges:   PT Evaluation $PT Eval Moderate Complexity: 1 Mod          Marie Burch, PT, DPT Acute Rehabilitation Services Pager [phone] Office [phone]   Marie Burch 08/29/2020, 12:38 PM

## 2020-08-29 NOTE — TOC Initial Note (Signed)
Transition of Care [facility]) - Initial/Assessment Note    Patient Details  Name: Marie Burch MRN: [id number] Date of Birth: 1934-07-03  Transition of [facility]) CM/SW Contact:    Trula Ore, [address] [place] Phone Number: 08/29/2020, 2:03 PM  Clinical Narrative:                  CSW received consult for possible SNF placement at time of discharge. CSW spoke with patients daughter Marie Burch  regarding PT recommendation of SNF placement at time of discharge.  CSW informed Marie Burch that she spoke with [name] who says that she believes patient does need to go to SNF before returning to ALF. Marie Burch is in agreement. Patients daughter expressed understanding of PT recommendation and is agreeable to SNF placement at time of discharge.Patients daughter gave permission for CSW to fax out initial referral to [facility] and [place] area. CSW discussed insurance authorization process to patients daughter.Patient has received the COVID vaccines.No further questions reported at this time. CSW to continue to follow and assist with discharge planning needs.  Expected Discharge Plan: Skilled Nursing Facility Barriers to Discharge: Continued Medical Work up   Patient Goals and CMS Choice     Choice offered to / list presented to : Adult Children (Marie Burch daughter)  Expected Discharge Plan and Services Expected Discharge Plan: [place]       Living arrangements for the past 2 months: [place]                                      Prior Living Arrangements/Services Living arrangements for the past 2 months: [place] Lives with:: Self, Facility Resident Patient language and need for interpreter reviewed:: Yes Do you feel safe going back to the place where you live?: No   SNF  Need for Family Participation in Patient Care: Yes (Comment) Care giver support system in place?: Yes (comment)   Criminal Activity/Legal Involvement Pertinent to  Current Situation/Hospitalization: No - Comment as needed  Activities of Daily Living Home Assistive Devices/Equipment: Walker (specify type) ADL Screening (condition at time of admission) Patient's cognitive ability adequate to safely complete daily activities?: No Is the patient deaf or have difficulty hearing?: No Does the patient have difficulty seeing, even when wearing glasses/contacts?: No Does the patient have difficulty concentrating, remembering, or making decisions?: Yes Patient able to express need for assistance with ADLs?: Yes Does the patient have difficulty dressing or bathing?: No Independently performs ADLs?: Yes (appropriate for developmental age) Communication: Independent Dressing (OT): Independent Is this a change from baseline?: Pre-admission baseline Grooming: Needs assistance Is this a change from baseline?: Pre-admission baseline Feeding: Independent Bathing: Needs assistance Is this a change from baseline?: Pre-admission baseline Toileting: Needs assistance In/Out Bed: Needs assistance Is this a change from baseline?: Pre-admission baseline Walks in Home: Needs assistance Is this a change from baseline?: Pre-admission baseline Does the patient have difficulty walking or climbing stairs?: Yes Weakness of Legs: Both Weakness of Arms/Hands: None  Permission Sought/Granted Permission sought to share information with : Case Manager, Family Supports, Customer service manager Permission granted to share information with : Yes, Verbal Permission Granted  Share Information with NAME: Marie Burch  Permission granted to share info w AGENCY: SNF  Permission granted to share info w Relationship: daughter  Permission granted to share info w Contact Information: Marie Burch [phone]  Emotional Assessment  Orientation: : Oriented to Self Alcohol / Substance Use: Not Applicable Psych Involvement: No (comment)  Admission diagnosis:  Hypokalemia [E87.6] Subdural  hematoma ([place]) [S06.5X9A] Patient Active Problem List   Diagnosis Date Noted  . Subdural hematoma ([place]) 08/28/2020  . Mixed hyperlipidemia 08/28/2020  . Hypokalemia, inadequate intake 08/28/2020  . Hx of CABG 07/12/2020  . Dementia with behavioral disturbance ([place]) 07/12/2020  . Pressure injury of skin 07/12/2020  . UTI (urinary tract infection) 07/12/2020  . Atrial flutter ([place]) 07/11/2020  . Paroxysmal atrial flutter ([place]) 07/10/2020  . S/P mitral valve repair 06/12/2014  . Coronary artery disease involving native coronary artery of native heart without angina pectoris 06/12/2014  . Essential hypertension 06/12/2014  . History of PSVT (paroxysmal supraventricular tachycardia) 06/12/2014  . Colon cancer ([place]) 08/29/2012   PCP:  Lajean Manes, MD Pharmacy:   [facility], [address] [place] [address] Phone: [phone] Fax: [phone]  [facility], [place] - [address] [place] [address] Phone: [phone] Fax: [phone]     Social Determinants of Health ([place]) Interventions    Readmission Risk Interventions No flowsheet data found.

## 2020-08-30 DIAGNOSIS — Z515 Encounter for palliative care: Secondary | ICD-10-CM

## 2020-08-30 DIAGNOSIS — S065X9A Traumatic subdural hemorrhage with loss of consciousness of unspecified duration, initial encounter: Principal | ICD-10-CM

## 2020-08-30 DIAGNOSIS — Z66 Do not resuscitate: Secondary | ICD-10-CM

## 2020-08-30 DIAGNOSIS — Z7189 Other specified counseling: Secondary | ICD-10-CM

## 2020-08-30 LAB — SARS CORONAVIRUS 2 BY RT PCR (HOSPITAL ORDER, PERFORMED IN ~~LOC~~ HOSPITAL LAB): SARS Coronavirus 2: NEGATIVE

## 2020-08-30 NOTE — Progress Notes (Signed)
PROGRESS NOTE    Marie Burch  UXN:[id number] DOB: 10/02/1934 DOA: 08/27/2020 PCP: Lajean Manes, MD   Brief Narrative: This 86 years old female with PMH of coronary artery disease, hypertension, advanced dementia,  paroxysmal A. fib, hyperlipidemia, presents to the emergency department status post unwitnessed fall.  Patient was found down for an unknown duration of time behind her nursing home facility.  Patient did not suffer any obvious head trauma.  Patient was recently started on Eliquis therapy for paroxysmal A. fib.  CT head shows subdural hemorrhage over the left frontal and temporal lobes. Neurosurgery was consulted recommended neurochecks and repeat CT in 24 hours.  Due to patient's Eliquis use Kcentra was administered.  Eliquis is on hold.  Repeat CT scan shows stable hematomas.  Neurosurgery states patient would be a poor surgical candidate. PT recommended SNF, awaiting insurance authorization.  Assessment & Plan:   Principal Problem:   Subdural hematoma (HCC) Active Problems:   Coronary artery disease involving native coronary artery of native heart without angina pectoris   Essential hypertension   Paroxysmal atrial flutter (HCC)   Dementia with behavioral disturbance (HCC)   Mixed hyperlipidemia   Hypokalemia, inadequate intake   Subdural hematoma [facility]):    CT imaging reveals evidence of left frontal and temporal lobe acute appearing subdural hematomas with evidence of moderate midline shift.  Patient [facility] emergency department 10/8 during which CT imaging of the head was performed at that time which revealed no evidence of hematoma.  [name] has already been administered considering patient's ongoing Eliquis use for atrial fibrillation.  Will hold further dosing of Eliquis going forward.  Neurosurgery consulted, who recommends close monitoring , serial neurologic checks and repeat CT imaging in the morning -neurosurgery states the  patient would be a poor surgical candidate.  Neurologic checks every 4 hours.  Family would be reluctant to proceed with any surgical intervention even if patient was a candidate.  Repeat CT scan showed stable hematomas.    Recurrent Falls:    Patient seems to have been found down behind her facility after an unwitnessed fall.  Cause of the fall is not clear.  Patient does appear to be clinically dehydrated however and therefore we will gently hydrate patient with intravenous isotonic fluids.  Chest x-ray reveals no evidence of pneumonia.  Telemetry reveals patient to be in normal sinus rhythm with stable blood pressures.   UA: unremarkable.  PT recommended SNF.  Paroxysmal atrial flutter:   Diagnosed in early September after which patient was placed on home-dosing Eliquis 2.5 mg twice daily.  Will discontinue Eliquis therapy going forward.  Patient is currently rate controlled on home regimen of atenolol.  Monitoring patient on telemetry.   Coronary artery disease involving native coronary artery of native heart without angina pectoris   Patient is currently chest pain-free.  Continue home regimen of atenolol   Monitoring patient on telemetry    Essential hypertension   Holding home regimen of hydrochlorothiazide, continue home regimen of amlodipine.    Dementia with behavioral disturbance [facility])   Patient has longstanding known history of dementia.  Minimizing uncomfortable stimuli and will redirect patient is much as possible.   She appears comfortable, there was no agitation or restlessness.    Hypokalemia, inadequate intake.   Replacing with oral potassium chloride  Improved.    DVT prophylaxis:  SCDS Code Status: DNR Family Communication: No one at bed side. Disposition Plan:  Status is: In patient   Dispo:  The patient is from: SNF              Anticipated d/c is to: SNF              Anticipated d/c date is: 10/23               Patient currently is not medically stable to d/c.   Consultants:   Neurosyrgery  Procedures:  None Antimicrobials:  Anti-infectives (From admission, onward)   None      Subjective: Patient was seen and examined at bedside. Overnight events noted.  Patient is more alert and awake,  following commands.   She appears much calmer and comfortable.   Objective: Vitals:   08/30/20 0010 08/30/20 0444 08/30/20 0905 08/30/20 1145  BP: (!) 141/67 140/64 127/63 137/77  Pulse: 60 (!) 56  (!) 58  Resp: 12 14 20 18   Temp: 98.7 F (37.1 C) 98.1 F (36.7 C) 98.4 F (36.9 C) 98.6 F (37 C)  TempSrc: Oral Oral Oral Oral  SpO2: 98% 98%  98%  Weight:      Height:        Intake/Output Summary (Last 24 hours) at 08/30/2020 1618 Last data filed at 08/30/2020 1300 Gross per 24 hour  Intake 720 ml  Output 550 ml  Net 170 ml   Filed Weights   08/27/20 2019  Weight: 55 kg    Examination:  General exam: Appears calm and comfortable.  Respiratory system: Clear to auscultation. Respiratory effort normal. Cardiovascular system: S1 & S2 heard, RRR. No JVD, murmurs, rubs, gallops or clicks. No pedal edema. Gastrointestinal system: Abdomen is nondistended, soft and nontender. No organomegaly or masses felt. Normal bowel sounds heard. Central nervous system: Alert and oriented. No focal neurological deficits. Extremities: No edema, no cyanosis, no clubbing Skin: No rashes, lesions or ulcers Psychiatry: Judgement and insight appear normal. Mood & affect appropriate.     Data Reviewed: I have personally reviewed following labs and imaging studies  CBC: Recent Labs  Lab 08/27/20 2035 08/28/20 0449 08/29/20 0915  WBC 8.4 7.6 7.5  NEUTROABS 5.6 4.8  --   HGB 15.3* 14.5 15.0  HCT 47.2* 45.3 46.0  MCV 86.4 88.3 87.5  PLT 197 182 790   Basic Metabolic Panel: Recent Labs  Lab 08/27/20 2035 08/28/20 0449 08/29/20 0915  NA 136 141 141  K 3.0* 3.1* 3.9  CL 96* 102 103  CO2 27 28  27   GLUCOSE 140* 119* 111*  BUN 18 11 10   CREATININE 1.26* 1.03* 1.05*  CALCIUM 9.3 8.9 9.0  MG  --  1.6* 2.2  PHOS  --   --  3.5   GFR: Estimated Creatinine Clearance: 31.5 mL/min (A) (by C-G formula based on SCr of 1.05 mg/dL (H)). Liver Function Tests: Recent Labs  Lab 08/28/20 0449  AST 22  22  ALT 17  15  ALKPHOS 38  38  BILITOT 0.6  0.6  PROT 6.3*  6.3*  ALBUMIN 3.6  3.5   No results for input(s): LIPASE, AMYLASE in the last 168 hours. No results for input(s): AMMONIA in the last 168 hours. Coagulation Profile: Recent Labs  Lab 08/28/20 0449  INR 1.1   Cardiac Enzymes: Recent Labs  Lab 08/27/20 2035  CKTOTAL 49   BNP (last 3 results) No results for input(s): PROBNP in the last 8760 hours. HbA1C: No results for input(s): HGBA1C in the last 72 hours. CBG: No results for input(s): GLUCAP in the last 168 hours. Lipid  Profile: No results for input(s): CHOL, HDL, LDLCALC, TRIG, CHOLHDL, LDLDIRECT in the last 72 hours. Thyroid Function Tests: No results for input(s): TSH, T4TOTAL, FREET4, T3FREE, THYROIDAB in the last 72 hours. Anemia Panel: No results for input(s): VITAMINB12, FOLATE, FERRITIN, TIBC, IRON, RETICCTPCT in the last 72 hours. Sepsis Labs: No results for input(s): PROCALCITON, LATICACIDVEN in the last 168 hours.  Recent Results (from the past 240 hour(s))  Respiratory Panel by RT PCR (Flu A&B, Covid) - Nasopharyngeal Swab     Status: None   Collection Time: 08/27/20  9:09 PM   Specimen: Nasopharyngeal Swab  Result Value Ref Range Status   SARS Coronavirus 2 by RT PCR NEGATIVE NEGATIVE Final    Comment: (NOTE) SARS-CoV-2 target nucleic acids are NOT DETECTED.  The SARS-CoV-2 RNA is generally detectable in upper respiratoy specimens during the acute phase of infection. The lowest concentration of SARS-CoV-2 viral copies this assay can detect is 131 copies/mL. A negative result does not preclude SARS-Cov-2 infection and should not be used  as the sole basis for treatment or other patient management decisions. A negative result may occur with  improper specimen collection/handling, submission of specimen other than nasopharyngeal swab, presence of viral mutation(s) within the areas targeted by this assay, and inadequate number of viral copies (<131 copies/mL). A negative result must be combined with clinical observations, patient history, and epidemiological information. The expected result is Negative.  Fact Sheet for Patients:  PinkCheek.be  Fact Sheet for Healthcare Providers:  GravelBags.it  This test is no t yet approved or cleared by the [place] FDA and  has been authorized for detection and/or diagnosis of SARS-CoV-2 by FDA under an Emergency Use Authorization (EUA). This EUA will remain  in effect (meaning this test can be used) for the duration of the COVID-19 declaration under Section 564(b)(1) of the Act, 21 U.S.C. section 360bbb-3(b)(1), unless the authorization is terminated or revoked sooner.     Influenza A by PCR NEGATIVE NEGATIVE Final   Influenza B by PCR NEGATIVE NEGATIVE Final    Comment: (NOTE) The Xpert Xpress SARS-CoV-2/FLU/RSV assay is intended as an aid in  the diagnosis of influenza from Nasopharyngeal swab specimens and  should not be used as a sole basis for treatment. Nasal washings and  aspirates are unacceptable for Xpert Xpress SARS-CoV-2/FLU/RSV  testing.  Fact Sheet for Patients: PinkCheek.be  Fact Sheet for Healthcare Providers: GravelBags.it  This test is not yet approved or cleared by the [place] FDA and  has been authorized for detection and/or diagnosis of SARS-CoV-2 by  FDA under an Emergency Use Authorization (EUA). This EUA will remain  in effect (meaning this test can be used) for the duration of the  Covid-19 declaration under Section  564(b)(1) of the Act, 21  U.S.C. section 360bbb-3(b)(1), unless the authorization is  terminated or revoked. Performed at [facility], [place] [address] [place], [place] [address]   Culture, Urine     Status: Abnormal   Collection Time: 08/28/20  2:46 AM   Specimen: Urine, Random  Result Value Ref Range Status   Specimen Description URINE, RANDOM  Final   Special Requests   Final    NONE Performed at [facility], [place] [address] [place], [place] [address]    Culture MULTIPLE SPECIES PRESENT, SUGGEST RECOLLECTION (A)  Final   Report Status 08/29/2020 FINAL  Final  MRSA PCR Screening     Status: None   Collection Time: 08/29/20 12:37 AM  Specimen: Nasopharyngeal  Result Value Ref Range Status   MRSA by PCR NEGATIVE NEGATIVE Final    Comment:        The GeneXpert MRSA Assay (FDA approved for NASAL specimens only), is one component of a comprehensive MRSA colonization surveillance program. It is not intended to diagnose MRSA infection nor to guide or monitor treatment for MRSA infections. Performed at [facility], [place] [address], [place], [place] [address]     Radiology Studies: No results found.  Scheduled Meds: . amiodarone  200 mg Oral Daily  . atenolol  25 mg Oral q morning - 10a  . clonazePAM  0.5 mg Oral QHS  . sertraline  100 mg Oral Daily   Continuous Infusions:   LOS: 1 day    Time spent: 25 mins.    Shawna Clamp, MD [facility]   If 7PM-7AM, please contact night-coverage

## 2020-08-30 NOTE — Consult Note (Signed)
Palliative Medicine Inpatient Consult Note  Reason for consult:  Goals of Care  HPI:  Per intake H&P --> This63 years old female with PMH of coronary artery disease, hypertension, advanced dementia,paroxysmal A. fib, hyperlipidemia, presents to the emergency department status post unwitnessed fall.   Palliative care was asked to get involved to aid in goals of care conversations.  Clinical Assessment/Goals of Care: I have reviewed medical records including EPIC notes, labs and imaging, received report from bedside RN, assessed the patient.    I called Vonna Drafts (daughter) to further discuss diagnosis prognosis, GOC, EOL wishes, disposition and options.   I introduced Palliative Medicine as specialized medical care for people living with serious illness. It focuses on providing relief from the symptoms and stress of a serious illness. The goal is to improve quality of life for both the patient and the family.  I asked [name] to tell me about her mother.  [name] shares that Lind is from [facility].  She has lived in [place] throughout the duration of her life.  She is a widow.  She has 1 daughter, 2 granddaughters, and 4 great-grandchildren.  She used to work as a Radiation protection practitioner.  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  Marranda has a green thumb, nits, crochets, and enjoys reading.  Patient is faithful and is a member of [employer].  For quite some time Aadvika had been living at home with a sitter who would help her with her basic activities of daily living and take her out.  Though this got too costly after some time and patient transitioned in March of this year to [facility].  Since being at [facility] she has had frequent persistent falls.  Furthermore we discussed dementia and the progressive nature of the disease process.  Discussed that it is a chronic disease like any other and we anticipate  in time symptom burden will worsen.  [name] is very well informed of this and knowledgeable of what to expect moving forward.  [name] shares that patient had fallen multiple times the most recent resulting in her subdural hematomas.  She shares that [facility] will not accept [place] back to their facility until she goes to rehabilitation.  [name] states that she was hopeful the patient will get placed at a better skilled nursing facility than [facility] though she realizes this may be the only facility that she gets accepted to therefore will transition her mother there.  A detailed discussion was had today regarding advanced directives Meara has created a power of attorney and living will.  [name] will send a copy via email to be scanned into vynca.    Concepts specific to code status, artifical feeding and hydration, continued IV antibiotics and rehospitalization was had.  Patient is identified as DO NOT RESUSCITATE DO NOT INTUBATE CODE STATUS.  There will be no heroic life-prolonging measures to be pursued.    The difference between a aggressive medical intervention path and a palliative comfort care path for this patient at this time was had.  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  She shares if Helling gets to a point where hospice is appropriate she would be very open to this conversation.  I will request that an outpatient palliative provider follow along to offer continued support values and goals of  care important to patient and family were attempted to be elicited.  Discussed the importance of continued conversation with family and their  medical providers regarding overall plan of care and treatment options, ensuring decisions are within the context of the patients values and GOCs.  Decision Maker: Ronita Hipps [phone]  SUMMARY OF RECOMMENDATIONS   DNAR/DNI  [facility] - Outpatient palliative  support  Will obtain copy of advance directives and scanned into Vynca  Spiritual support  Code Status/Advance Care Planning: DNAR/DNI   Palliative Prophylaxis:   Critical care, delirium prevention, mobility, aspiration precautions  Additional Recommendations (Limitations, Scope, Preferences):  Treat what is treatable    Psycho-social/Spiritual:   Desire for further Chaplaincy support:  Yes-belongs to [employer]  Additional Recommendations:  Education on the progressive nature of Alzheimer's dementia   Prognosis:  Unclear  Discharge Planning:  Will transition to [place] have requested outpatient palliative support as well  PPS: 50%   This conversation/these recommendations were discussed with patient primary care team, Dr. Dwyane Dee  Time In: 1650 Time Out:1800 Total Time: 22 Greater than 50%  of this time was spent counseling and coordinating care related to the above assessment and plan.  [place] Team Team Cell Phone: [phone] Please utilize secure chat with additional questions, if there is no response within 30 minutes please call the above phone number  Palliative Medicine Team providers are available by phone from 7am to 7pm daily and can be reached through the team cell phone.  Should this patient require assistance outside of these hours, please call the patient's attending physician.

## 2020-08-30 NOTE — TOC Progression Note (Signed)
Transition of Care [facility]) - Progression Note    Patient Details  Name: Marie Burch MRN: [id number] Date of Birth: Jan 26, 1934  Transition of Care [facility]) CM/SW [place], [place] Phone Number: 08/30/2020, 11:42 AM  Clinical Narrative:     Patient has no bed offers  CSW will continue to follow and assist with discharge planning.  Thurmond Butts, MSW, [place] Clinical Social Worker   Expected Discharge Plan: Skilled Nursing Facility Barriers to Discharge: Continued Medical Work up  Expected Discharge Plan and Services Expected Discharge Plan: [place] arrangements for the past 2 months: [place]                                       Social Determinants of Health (SDOH) Interventions    Readmission Risk Interventions No flowsheet data found.

## 2020-08-30 NOTE — TOC Progression Note (Addendum)
Transition of Care [facility]) - Progression Note    Patient Details  Name: Marie Burch MRN: [id number] Date of Birth: [birth year]  Transition of Care [facility]) CM/SW [place], [place] Phone Number: 08/30/2020, 2:44 PM  Clinical Narrative:     CSW spoke with patient's daughter,Marie Burch- CSW  informed the only bed offer was with [facility]. Patient's daughter accepted the offer.  CSW called [facility]- confirmed availability- CSW explained possible discharge tomorrow.   Received insurance auth for SNF # [id number] for 7 days - received authorization for [facility] # [id number].  CSW called to confirm plan with patient's daughter- she states after looking at reviews . She is not certain she wants patient to go that SNF- CSW advised [place] is her only bed offer- she requested to send out referrals to [facility] [facility] and [facility] area. CSW will follow up tomorrow on any other bed options.   Thurmond Butts, MSW, [place] Clinical Social Worker   Expected Discharge Plan: Skilled Nursing Facility Barriers to Discharge: Continued Medical Work up  Expected Discharge Plan and Services Expected Discharge Plan: [place] arrangements for the past 2 months: [place]                                       Social Determinants of Health (SDOH) Interventions    Readmission Risk Interventions No flowsheet data found.

## 2020-08-31 DIAGNOSIS — Z7189 Other specified counseling: Secondary | ICD-10-CM | POA: Diagnosis not present

## 2020-08-31 DIAGNOSIS — S065X9A Traumatic subdural hemorrhage with loss of consciousness of unspecified duration, initial encounter: Secondary | ICD-10-CM | POA: Diagnosis not present

## 2020-08-31 DIAGNOSIS — I6200 Nontraumatic subdural hemorrhage, unspecified: Secondary | ICD-10-CM | POA: Diagnosis not present

## 2020-08-31 DIAGNOSIS — E876 Hypokalemia: Secondary | ICD-10-CM | POA: Diagnosis not present

## 2020-08-31 DIAGNOSIS — M2550 Pain in unspecified joint: Secondary | ICD-10-CM | POA: Diagnosis not present

## 2020-08-31 DIAGNOSIS — R0902 Hypoxemia: Secondary | ICD-10-CM | POA: Diagnosis not present

## 2020-08-31 DIAGNOSIS — Z7401 Bed confinement status: Secondary | ICD-10-CM | POA: Diagnosis not present

## 2020-08-31 DIAGNOSIS — I4892 Unspecified atrial flutter: Secondary | ICD-10-CM | POA: Diagnosis not present

## 2020-08-31 DIAGNOSIS — Z515 Encounter for palliative care: Secondary | ICD-10-CM | POA: Diagnosis not present

## 2020-08-31 DIAGNOSIS — W19XXXA Unspecified fall, initial encounter: Secondary | ICD-10-CM | POA: Diagnosis not present

## 2020-08-31 DIAGNOSIS — E785 Hyperlipidemia, unspecified: Secondary | ICD-10-CM | POA: Diagnosis not present

## 2020-08-31 DIAGNOSIS — Z23 Encounter for immunization: Secondary | ICD-10-CM | POA: Diagnosis not present

## 2020-08-31 DIAGNOSIS — I2510 Atherosclerotic heart disease of native coronary artery without angina pectoris: Secondary | ICD-10-CM | POA: Diagnosis not present

## 2020-08-31 DIAGNOSIS — R4182 Altered mental status, unspecified: Secondary | ICD-10-CM | POA: Diagnosis not present

## 2020-08-31 DIAGNOSIS — R296 Repeated falls: Secondary | ICD-10-CM | POA: Diagnosis not present

## 2020-08-31 DIAGNOSIS — F0281 Dementia in other diseases classified elsewhere with behavioral disturbance: Secondary | ICD-10-CM | POA: Diagnosis not present

## 2020-08-31 DIAGNOSIS — I10 Essential (primary) hypertension: Secondary | ICD-10-CM | POA: Diagnosis not present

## 2020-08-31 DIAGNOSIS — F419 Anxiety disorder, unspecified: Secondary | ICD-10-CM | POA: Diagnosis not present

## 2020-08-31 DIAGNOSIS — R2681 Unsteadiness on feet: Secondary | ICD-10-CM | POA: Diagnosis not present

## 2020-08-31 DIAGNOSIS — F0390 Unspecified dementia without behavioral disturbance: Secondary | ICD-10-CM | POA: Diagnosis not present

## 2020-08-31 DIAGNOSIS — F5104 Psychophysiologic insomnia: Secondary | ICD-10-CM | POA: Diagnosis not present

## 2020-08-31 DIAGNOSIS — S065X0D Traumatic subdural hemorrhage without loss of consciousness, subsequent encounter: Secondary | ICD-10-CM | POA: Diagnosis not present

## 2020-08-31 DIAGNOSIS — Z66 Do not resuscitate: Secondary | ICD-10-CM | POA: Diagnosis not present

## 2020-08-31 NOTE — TOC Progression Note (Cosign Needed)
Transition of Care [facility]) - Progression Note    Patient Details  Name: Marie Burch MRN: [id number] Date of Birth: Apr 06, 1934  Transition of Care [facility]) CM/SW Contact  Andria Frames, [place] Work Phone Number: [phone] 08/31/2020, 11:51 AM  Clinical Narrative:     MSW Intern reached out to patient's daughter to discuss bed offer with [facility]. Patient's daughter expressed she is not happy with this placement and asked if patient's paperwork was sent out to facilities in other areas as she previously requested.  MSW intern confirmed this and advised that are currently no other bed offers. MSW Intern advised that [facility] is in the process of preparing patient for discharge and she acknowledged understanding. Patient's daughter inquired about whether or not she will need to do paperwork when patient is transported to the facility.  MSW Intern confirmed that the daughter will need to complete the paperwork as patient is oriented to self only.  Patient's daughter acknowledged understanding but stated she has two appointments today and if this happens while she is busy, facility staff will have to wait until she is finished.   MSW Intern attempted to contact [facility] @[phone]  three times with no answer and voicemail is full. MSW Intern then contacted the facility @[phone] , spoke to 2nd floor RN who provided the number @[phone] ; sales staff answered advising the facility is expecting admission this week however she does not have any information to confirm regarding patient. Staff will reach out to [facility] for information. MSW Intern will follow up in an hour.   TOC will continue to follow for discharge supports.    Expected Discharge Plan: [place] Barriers to Discharge: Barriers Resolved  Expected Discharge Plan and Services Expected Discharge Plan: [place] arrangements for the past 2 months:  Assisted Living Facility                                       Social Determinants of Health (SDOH) Interventions    Readmission Risk Interventions No flowsheet data found.

## 2020-08-31 NOTE — Progress Notes (Cosign Needed)
MSW Intern received call from [place] with [facility] confirming information for placement.   Bed# 108A, Call to Report [id number]  Medical necessity completed. MSW Intern confirmed DNR has been signed and placed in chart.   MSW Intern contacted [facility] for transport and notified patient's mother that patient is being discharged.

## 2020-08-31 NOTE — Progress Notes (Signed)
[facility] ([facility])  Hospital Liaison RN note         Notified by TOC manager of patient/family request for [facility] Palliative services at home after discharge.                 [facility] Palliative team will follow up with patient after discharge.         Please call with any hospice or palliative related questions.         Thank you for the opportunity to participate in this patient's care.     Chrislyn King, BSN, RN [facility] Hospital Liaison (listed on [facility] under Hospice[facility])    [phone]     

## 2020-08-31 NOTE — Progress Notes (Signed)
Report given to Morene Antu, receiving RN at [facility]. All questions answered. Patient currently en route via [facility].

## 2020-08-31 NOTE — Progress Notes (Signed)
Pt alert and aware sitting up in chair. She states that her faith is very important to her. The chaplain offered caring and supportive presence, prayers and blessings. We sang [place] together.

## 2020-08-31 NOTE — Discharge Summary (Addendum)
Physician Discharge Summary  Marie Burch RFF:[id number] DOB: [birth year] DOA: 08/27/2020  PCP: Marie Manes, MD  Admit date: 08/27/2020.  Discharge date: 08/31/2020  Admitted From: Skilled Nursing home.  Disposition:   SNF [facility]).  Recommendations for Outpatient Follow-up:  1. Follow up with PCP in 1-2 weeks. 2. Please obtain BMP/CBC in one week. 3. Eliquis has been discontinued due to subdural hematomas. 4. Advised to continue amiodarone 200 mg daily.  Home Health: None Equipment/Devices: None  Discharge Condition: Stable CODE STATUS:DNR Diet recommendation: Heart Healthy   Brief Summary / Hospital Course: This86 years old female with PMH of coronary artery disease, hypertension, advanced dementia,paroxysmal A. fib, hyperlipidemia, presents to the emergency department status post unwitnessed fall. Patient was found down for an unknown duration of time behind her nursing home facility. Patient did not suffer any obvious head trauma. Patient was recently started on Eliquis therapy for paroxysmal A. Fib last month. CT head shows subdural hemorrhage over the left frontal and temporal lobes. Neurosurgery was consulted recommended neurochecks and repeat CT in 24 hours. Due to patient's Eliquis use Kcentra was administered for reversal. Eliquis is on hold. Repeat CT scan shows stable hematomas. Neurosurgery states patient would be a poor surgical candidate. Family doesn't want aggressive or operative intervention.  PT recommended SNF, awaiting insurance authorization. Patient is more awake, alert. Oriented x 2, Palliative care consulted to discuss goals of care. Patient is DNR and palliative care to follow outpatient. Patient is being discharged to [facility]. Eliquis has been discontinued.  She was managed for below problems.  Discharge Diagnoses:  Principal Problem:   Subdural hematoma (HCC) Active Problems:   Coronary artery disease involving native  coronary artery of native heart without angina pectoris   Essential hypertension   Paroxysmal atrial flutter (HCC)   Dementia with behavioral disturbance (HCC)   Mixed hyperlipidemia   Hypokalemia, inadequate intake   Palliative care by specialist   Goals of care, counseling/discussion   DNR (do not resuscitate)   Falls frequently  Subdural hematoma [facility]):    CT imaging reveals evidence of left frontal and temporal lobe acute appearing subdural hematomas with evidence of moderate midline shift.  Patient presented to [facility] emergency department 10/8 during which CT imaging of the head was performed at that time which revealed no evidence of hematoma.  Marie Burch has already been administered considering patient's ongoing Eliquis use for atrial fibrillation.  Will hold further dosing of Eliquis going forward.  Neurosurgery consulted, who recommends close monitoring , serial neurologic checks and repeat CT imaging in the morning-neurosurgery states the patient would be a poor surgical candidate.  Neurologic checks every 4 hours.  Family would be reluctant to proceed with any surgical intervention even if patient was a candidate.  Repeat CT scan showed stable hematomas.    Recurrent Falls:    Patient seems to have been found down behind her facility after an unwitnessed fall.  Cause of the fall isnot clear. Patient does appear to be clinically dehydrated however and therefore we will gently hydrate patient with intravenous isotonic fluids.  Chest x-ray reveals no evidence of pneumonia. Telemetry reveals patient to be in normal sinus rhythm with stable blood pressures.  UA: unremarkable.  PT recommended SNF.  Paroxysmal atrial flutter:   Diagnosed in early September after which patient was placed on home-dosing Eliquis 2.5 mg twice daily.  Will discontinue Eliquis therapy going forward.  Patient is currently rate controlled on home regimen of  atenolol.  Monitoring patient on telemetry.  Coronary artery disease involving native coronary artery of native heart without angina pectoris   Patient is currently chest pain-free.  Continue home regimen of atenolol   Monitoring patient on telemetry  Essential hypertension   Holding home regimen of hydrochlorothiazide, continue home regimen of amlodipine.  Dementia with behavioral disturbance [facility])   Patient has longstanding known history of dementia.  Minimizing uncomfortable stimuli and will redirect patient is much as possible.   She appears comfortable, there was no agitation or restlessness.  Hypokalemia, inadequate intake.   Replacing with oral potassium chloride  Improved.   Discharge Instructions  Discharge Instructions    Call MD for:  difficulty breathing, headache or visual disturbances   Complete by: As directed    Call MD for:  persistant dizziness or light-headedness   Complete by: As directed    Call MD for:  persistant nausea and vomiting   Complete by: As directed    Call MD for:  temperature >100.4   Complete by: As directed    Diet - low sodium heart healthy   Complete by: As directed    Diet Carb Modified   Complete by: As directed    Discharge instructions   Complete by: As directed    Advised to follow up PCP in one week. Advised to discontinue Eliquis given recent subdural hematoma. Patient is being discharged to SNF for rehabilitation.   Discharge wound care:   Complete by: As directed    Follow up Wound care at Nursing home.   Increase activity slowly   Complete by: As directed      Allergies as of 08/31/2020      Reactions   Penicillins Anaphylaxis   Levaquin [levofloxacin] Other (See Comments)   PT TOLD TO LEAVE ANTIBIOTICS ALONE AFTER NEPHRECTOMY PT HAD REACTION TO ANTIBIOTICS   Niacin And Related Other (See Comments)   unknown   Norvasc [amlodipine Besylate] Other (See Comments)   Unknown, PT NOT SURE    Novocain [procaine Hcl]    Numbness would not leave    Pravachol [pravastatin Sodium] Other (See Comments)   Unknown, ? Questionable muscle aches   Statins Other (See Comments)   Unknown, questionable muscle aches   Sulfa Antibiotics    Zetia [ezetimibe] Other (See Comments)   Unknown, pt not sure      Medication List    STOP taking these medications   apixaban 2.5 MG Tabs tablet Commonly known as: ELIQUIS   traMADol 50 MG tablet Commonly known as: ULTRAM     TAKE these medications   alendronate 70 MG tablet Commonly known as: FOSAMAX Take 70 mg by mouth once a week. Take with a full glass of water on an empty stomach.   amiodarone 200 MG tablet Commonly known as: PACERONE Take 1 tablet by mouth twice a day for 2 weeks, then take 1 tablet by mouth once a day What changed:   how much to take  how to take this  when to take this  additional instructions   atenolol 25 MG tablet Commonly known as: TENORMIN Take 25 mg by mouth every morning.   Calcium+D3 600-800 MG-UNIT Tabs Generic drug: Calcium Carb-Cholecalciferol Take 1 tablet by mouth daily.   clonazePAM 0.5 MG tablet Commonly known as: KLONOPIN Take 0.5 mg by mouth at bedtime.   Fish Oil 1000 MG Caps Take 2,000 mg by mouth daily.   hydrochlorothiazide 25 MG tablet Commonly known as: HYDRODIURIL Take 25 mg by mouth  daily.   Nitrostat 0.4 MG SL tablet Generic drug: nitroGLYCERIN DISSOLVE 1 TABLET UNDER THE TONGUE EVERY5 MINUTES AS NEEDED UP TO 3 DOSES What changed: See the new instructions.   polyethylene glycol 17 g packet Commonly known as: MIRALAX / GLYCOLAX Take 17 g by mouth daily as needed (for constipation).   potassium chloride SA 20 MEQ tablet Commonly known as: KLOR-CON Take 20 mEq by mouth daily.   sertraline 100 MG tablet Commonly known as: ZOLOFT Take 100 mg by mouth daily.            Discharge Care Instructions  (From admission, onward)         Start     Ordered    08/31/20 0000  Discharge wound care:       Comments: Follow up Wound care at Nursing home.   08/31/20 1412          Follow-up Information    Stoneking, Hal, MD Follow up in 1 week(s).   Specialty: Internal Medicine Contact information: [address] [place] [address] [phone]        Belva Crome, MD .   Specialty: Cardiology Contact information: [id number] [place] [address] [phone]              Allergies  Allergen Reactions  . Penicillins Anaphylaxis  . Levaquin [Levofloxacin] Other (See Comments)    PT TOLD TO LEAVE ANTIBIOTICS ALONE AFTER NEPHRECTOMY PT HAD REACTION TO ANTIBIOTICS  . Niacin And Related Other (See Comments)    unknown  . Norvasc [Amlodipine Besylate] Other (See Comments)    Unknown, PT NOT SURE  . Novocain [Procaine Hcl]     Numbness would not leave   . Pravachol [Pravastatin Sodium] Other (See Comments)    Unknown, ? Questionable muscle aches  . Statins Other (See Comments)    Unknown, questionable muscle aches  . Sulfa Antibiotics   . Zetia [Ezetimibe] Other (See Comments)    Unknown, pt not sure    Consultations:  Neurosurgery   Procedures/Studies: CT HEAD WO CONTRAST  Result Date: 08/28/2020 CLINICAL DATA:  Follow-up subdural hematoma EXAM: CT HEAD WITHOUT CONTRAST TECHNIQUE: Contiguous axial images were obtained from the base of the skull through the vertex without intravenous contrast. COMPARISON:  Yesterday FINDINGS: Brain: Bilateral subdural collection which is primarily low-density but also contains high-density clot on the left. Left-sided high-density clot measures up to 11 mm in thickness along the temporal convexity. Low-density components measure up to 13 mm at the left frontal convexity and 10 mm in thickness at the right frontal convexity. These cause cortical mass effect. No infarct, hydrocephalus, or masslike finding. Chronic small vessel disease in the cerebral white  matter Vascular: No hyperdense vessel or unexpected calcification. Skull: Normal. Negative for fracture or focal lesion. Sinuses/Orbits: Bilateral cataract resection IMPRESSION: 1. Size stable high-density subdural hematoma on the left, up to 11 mm in thickness. 2. Bilateral low-density subdural collections are also unchanged, up to 13 mm in thickness on the left. Electronically Signed   By: Monte Fantasia M.D.   On: 08/28/2020 07:44   CT Head Wo Contrast  Result Date: 08/27/2020 CLINICAL DATA:  86 year old female with neck trauma. EXAM: CT HEAD WITHOUT CONTRAST CT CERVICAL SPINE WITHOUT CONTRAST TECHNIQUE: Multidetector CT imaging of the head and cervical spine was performed following the standard protocol without intravenous contrast. Multiplanar CT image reconstructions of the cervical spine were also generated. COMPARISON:  Cervical spine CT dated  08/16/2020. FINDINGS: CT HEAD FINDINGS Brain: Large bilateral hemispheric low attenuating subdural collections, likely subacute or chronic, but new since the prior CT of 02/17/2020 measuring up to 1 cm in thickness. There is high attenuating subdural blood over the left temporal and parietal lobes measuring approximately 9 mm in thickness consistent with acute bleed. There is associated mass effect on the brain parenchyma by the bilateral subdural collections. No midline shift. Vascular: No hyperdense vessel or unexpected calcification. Skull: Normal. Negative for fracture or focal lesion. Sinuses/Orbits: No acute finding. Other: None CT CERVICAL SPINE FINDINGS Alignment: No acute subluxation. Skull base and vertebrae: No acute fracture. Osteopenia. Probable small bone island in C6. Soft tissues and spinal canal: No prevertebral fluid or swelling. No visible canal hematoma. Disc levels: Multilevel degenerative changes with endplate irregularity and disc space narrowing. Upper chest: Negative. Other: Subcentimeter left thyroid hypodense nodule Not clinically  significant; no follow-up imaging recommended (ref: J Am Coll Radiol. 2015 Feb;12(2): 143-50). IMPRESSION: 1. Acute subdural hemorrhage over the left frontal and temporal lobe on subacute or chronic bilateral hemispheric subdural hemorrhages. Moderate mass effect on the brain parenchyma. No midline shift. 2. No acute/traumatic cervical spine pathology. These results were called by telephone at the time of interpretation on 08/27/2020 at 9:47 pm to provider ABIGAIL HARRIS , who verbally acknowledged these results. Electronically Signed   By: Anner Crete M.D.   On: 08/27/2020 21:50   CT Head Wo Contrast  Result Date: 08/16/2020 CLINICAL DATA:  Unwitnessed fall, posterior hematoma, on anticoagulation EXAM: CT HEAD WITHOUT CONTRAST CT CERVICAL SPINE WITHOUT CONTRAST TECHNIQUE: Multidetector CT imaging of the head and cervical spine was performed following the standard protocol without intravenous contrast. Multiplanar CT image reconstructions of the cervical spine were also generated. COMPARISON:  CT head 02/17/2020, CT head and cervical spine 10/30/2016, MRI 02/15/2015 FINDINGS: CT HEAD FINDINGS Brain: No evidence of acute infarction, hemorrhage, hydrocephalus, extra-axial collection, visible mass lesion or mass effect. Symmetric prominence of the ventricles, cisterns and sulci compatible with parenchymal volume loss. Patchy areas of white matter hypoattenuation are most compatible with chronic microvascular angiopathy. Stable benign senescent mineralization in the right basal ganglia. Basal cisterns are patent. Partially empty appearance of the sella is unchanged from comparison. Remaining midline intracranial structures are unremarkable. Cerebellar tonsils are normally positioned. Vascular: Atherosclerotic calcification of the carotid siphons. No hyperdense vessel. Skull: Right frontoparietal scalp swelling and hematoma with overlying laceration. Crescentic hematoma measures up to 14 mm in maximal thickness.  Overlying bandaging material is present. No subjacent calvarial fracture or acute osseous injuries are identified. Sinuses/Orbits: Paranasal sinuses and mastoid air cells are predominantly clear. Orbital structures are unremarkable aside from prior lens extractions. Other: None CT CERVICAL SPINE FINDINGS Alignment: Exaggeration of the cervical lordosis. Mild rightward cranial rotation. Minimal anterolisthesis C7 on T1 is unchanged from comparison. No evidence of traumatic listhesis. No abnormally widened, perched or jumped facets. Normal alignment of the craniocervical and atlantoaxial articulations accounting for cranial rotation. Levocurvature of the upper thoracic spine is noted on coronal imaging. Skull base and vertebrae: The osseous structures appear diffusely demineralized which may limit detection of small or nondisplaced fractures. No discernible acute skull base or vertebral fracture is identified. Multilevel Schmorl's node formations are present. Stable appearance of a bone island in C6. Moderate arthrosis at the atlantodental interval with calcific pannus formation, nonspecific but can be seen with CPPD or rheumatoid arthropathy. Additional multilevel spondylitic changes as detailed below. Soft tissues and spinal canal: Calcific pannus formation, as above. No  pre or paravertebral fluid or swelling. No visible canal hematoma. Disc levels: Multilevel intervertebral disc height loss with spondylitic endplate changes. Slightly larger disc osteophyte complexes present C4-5 C6-7 likely result in some mild central canal stenosis. Multilevel uncinate spurring and facet hypertrophic changes are present as well resulting and mild to moderate multilevel foraminal narrowing most pronounced on the right C3-4, C4-5. Upper chest: No acute abnormality in the upper chest or imaged lung apices. Biapical pleuroparenchymal scarring. Other: Calcifications in the proximal great vessels and cervical carotids. Subcentimeter  hypoattenuating nodule in the right lobe thyroid gland. No follow-up imaging is recommended. Reference: J Am Coll Radiol. 2015 Feb;12(2): 143-50. IMPRESSION: 1. Right frontoparietal scalp swelling and hematoma with overlying laceration. Crescentic hematoma measures up to 14 mm in maximal thickness. No subjacent calvarial fracture or acute osseous injuries are identified. 2. No evidence of acute intracranial abnormality. Stable parenchymal volume loss and chronic microvascular angiopathy. 3. No evidence of acute fracture or subluxation of the cervical spine. 4. Multilevel degenerative changes of the cervical spine as described. 5. Calcific pannus formation, nonspecific but can be seen with CPPD or rheumatoid arthropathy. 6. Intracranial and cervical atherosclerosis. Electronically Signed   By: Lovena Le M.D.   On: 08/16/2020 18:34   CT Cervical Spine Wo Contrast  Result Date: 08/27/2020 CLINICAL DATA:  86 year old female with neck trauma. EXAM: CT HEAD WITHOUT CONTRAST CT CERVICAL SPINE WITHOUT CONTRAST TECHNIQUE: Multidetector CT imaging of the head and cervical spine was performed following the standard protocol without intravenous contrast. Multiplanar CT image reconstructions of the cervical spine were also generated. COMPARISON:  Cervical spine CT dated 08/16/2020. FINDINGS: CT HEAD FINDINGS Brain: Large bilateral hemispheric low attenuating subdural collections, likely subacute or chronic, but new since the prior CT of 02/17/2020 measuring up to 1 cm in thickness. There is high attenuating subdural blood over the left temporal and parietal lobes measuring approximately 9 mm in thickness consistent with acute bleed. There is associated mass effect on the brain parenchyma by the bilateral subdural collections. No midline shift. Vascular: No hyperdense vessel or unexpected calcification. Skull: Normal. Negative for fracture or focal lesion. Sinuses/Orbits: No acute finding. Other: None CT CERVICAL SPINE  FINDINGS Alignment: No acute subluxation. Skull base and vertebrae: No acute fracture. Osteopenia. Probable small bone island in C6. Soft tissues and spinal canal: No prevertebral fluid or swelling. No visible canal hematoma. Disc levels: Multilevel degenerative changes with endplate irregularity and disc space narrowing. Upper chest: Negative. Other: Subcentimeter left thyroid hypodense nodule Not clinically significant; no follow-up imaging recommended (ref: J Am Coll Radiol. 2015 Feb;12(2): 143-50). IMPRESSION: 1. Acute subdural hemorrhage over the left frontal and temporal lobe on subacute or chronic bilateral hemispheric subdural hemorrhages. Moderate mass effect on the brain parenchyma. No midline shift. 2. No acute/traumatic cervical spine pathology. These results were called by telephone at the time of interpretation on 08/27/2020 at 9:47 pm to provider ABIGAIL HARRIS , who verbally acknowledged these results. Electronically Signed   By: Anner Crete M.D.   On: 08/27/2020 21:50   CT Cervical Spine Wo Contrast  Result Date: 08/16/2020 CLINICAL DATA:  Unwitnessed fall, posterior hematoma, on anticoagulation EXAM: CT HEAD WITHOUT CONTRAST CT CERVICAL SPINE WITHOUT CONTRAST TECHNIQUE: Multidetector CT imaging of the head and cervical spine was performed following the standard protocol without intravenous contrast. Multiplanar CT image reconstructions of the cervical spine were also generated. COMPARISON:  CT head 02/17/2020, CT head and cervical spine 10/30/2016, MRI 02/15/2015 FINDINGS: CT HEAD FINDINGS Brain: No evidence  of acute infarction, hemorrhage, hydrocephalus, extra-axial collection, visible mass lesion or mass effect. Symmetric prominence of the ventricles, cisterns and sulci compatible with parenchymal volume loss. Patchy areas of white matter hypoattenuation are most compatible with chronic microvascular angiopathy. Stable benign senescent mineralization in the right basal ganglia. Basal  cisterns are patent. Partially empty appearance of the sella is unchanged from comparison. Remaining midline intracranial structures are unremarkable. Cerebellar tonsils are normally positioned. Vascular: Atherosclerotic calcification of the carotid siphons. No hyperdense vessel. Skull: Right frontoparietal scalp swelling and hematoma with overlying laceration. Crescentic hematoma measures up to 14 mm in maximal thickness. Overlying bandaging material is present. No subjacent calvarial fracture or acute osseous injuries are identified. Sinuses/Orbits: Paranasal sinuses and mastoid air cells are predominantly clear. Orbital structures are unremarkable aside from prior lens extractions. Other: None CT CERVICAL SPINE FINDINGS Alignment: Exaggeration of the cervical lordosis. Mild rightward cranial rotation. Minimal anterolisthesis C7 on T1 is unchanged from comparison. No evidence of traumatic listhesis. No abnormally widened, perched or jumped facets. Normal alignment of the craniocervical and atlantoaxial articulations accounting for cranial rotation. Levocurvature of the upper thoracic spine is noted on coronal imaging. Skull base and vertebrae: The osseous structures appear diffusely demineralized which may limit detection of small or nondisplaced fractures. No discernible acute skull base or vertebral fracture is identified. Multilevel Schmorl's node formations are present. Stable appearance of a bone island in C6. Moderate arthrosis at the atlantodental interval with calcific pannus formation, nonspecific but can be seen with CPPD or rheumatoid arthropathy. Additional multilevel spondylitic changes as detailed below. Soft tissues and spinal canal: Calcific pannus formation, as above. No pre or paravertebral fluid or swelling. No visible canal hematoma. Disc levels: Multilevel intervertebral disc height loss with spondylitic endplate changes. Slightly larger disc osteophyte complexes present C4-5 C6-7 likely  result in some mild central canal stenosis. Multilevel uncinate spurring and facet hypertrophic changes are present as well resulting and mild to moderate multilevel foraminal narrowing most pronounced on the right C3-4, C4-5. Upper chest: No acute abnormality in the upper chest or imaged lung apices. Biapical pleuroparenchymal scarring. Other: Calcifications in the proximal great vessels and cervical carotids. Subcentimeter hypoattenuating nodule in the right lobe thyroid gland. No follow-up imaging is recommended. Reference: J Am Coll Radiol. 2015 Feb;12(2): 143-50. IMPRESSION: 1. Right frontoparietal scalp swelling and hematoma with overlying laceration. Crescentic hematoma measures up to 14 mm in maximal thickness. No subjacent calvarial fracture or acute osseous injuries are identified. 2. No evidence of acute intracranial abnormality. Stable parenchymal volume loss and chronic microvascular angiopathy. 3. No evidence of acute fracture or subluxation of the cervical spine. 4. Multilevel degenerative changes of the cervical spine as described. 5. Calcific pannus formation, nonspecific but can be seen with CPPD or rheumatoid arthropathy. 6. Intracranial and cervical atherosclerosis. Electronically Signed   By: Lovena Le M.D.   On: 08/16/2020 18:34   DG Pelvis Portable  Result Date: 08/27/2020 CLINICAL DATA:  Recent fall with pelvic pain, initial encounter EXAM: PORTABLE PELVIS 1-2 VIEWS COMPARISON:  08/16/2020 FINDINGS: Pelvic ring is intact. Degenerative changes of the hip joints and lumbar spine are seen. No acute fracture or dislocation is noted. IMPRESSION: No acute abnormality noted. Electronically Signed   By: Inez Catalina M.D.   On: 08/27/2020 20:46   DG Pelvis Portable  Result Date: 08/16/2020 CLINICAL DATA:  Trauma, fall EXAM: PORTABLE PELVIS 1-2 VIEWS COMPARISON:  None. FINDINGS: SI joints are non widened. Pubic symphysis and rami are intact. Both femoral heads project in  joint. No acute  displaced fracture or malalignment. Mild degenerative changes of both hips. IMPRESSION: No acute osseous abnormality. Electronically Signed   By: Donavan Foil M.D.   On: 08/16/2020 17:36   DG Chest Port 1 View  Result Date: 08/27/2020 CLINICAL DATA:  Recent fall with chest pain, initial encounter EXAM: PORTABLE CHEST 1 VIEW COMPARISON:  08/16/2020 FINDINGS: Cardiac shadow is mildly enlarged but stable. Postsurgical changes are seen. Aortic calcifications are noted. Tortuous vasculature is noted in the superior mediastinum. The lungs are well aerated bilaterally. No focal infiltrate is seen. No acute bony abnormality is noted. IMPRESSION: No acute abnormality noted. Electronically Signed   By: Inez Catalina M.D.   On: 08/27/2020 20:45   DG Chest Portable 1 View  Result Date: 08/16/2020 CLINICAL DATA:  Chest pain after falling. EXAM: PORTABLE CHEST 1 VIEW COMPARISON:  Radiographs 07/10/2020 and 02/05/2014. FINDINGS: Stable heart size and mediastinal contours post median sternotomy and CABG. There is aortic atherosclerosis. The lungs appear stable. There is no edema, confluent airspace opacity, pleural effusion or pneumothorax. Thoracic scoliosis and bilateral glenohumeral degenerative changes are noted. IMPRESSION: No acute cardiopulmonary process. Stable postoperative chest. Electronically Signed   By: Richardean Sale M.D.   On: 08/16/2020 17:35       Subjective: Patient was seen and examined at bedside.  No overnight events.  Patient was sitting comfortably on the chair.  She is alert and oriented x2.   Patient is being discharged to [facility] home today for rehab.  Discharge Exam: Vitals:   08/31/20 0802 08/31/20 0924  BP: 139/74 123/69  Pulse: (!) 59 (!) 58  Resp: 18   Temp: 98.1 F (36.7 C)   SpO2: 97%    Vitals:   08/30/20 2344 08/31/20 0516 08/31/20 0802 08/31/20 0924  BP: (!) 152/82 119/69 139/74 123/69  Pulse: 61 60 (!) 59 (!) 58  Resp: 18 18 18    Temp: 97.8 F  (36.6 C) 97.8 F (36.6 C) 98.1 F (36.7 C)   TempSrc: Oral Oral Oral   SpO2:  95% 97%   Weight:      Height:        General: Pt is alert, awake, not in acute distress Cardiovascular: RRR, S1/S2 +, no rubs, no gallops Respiratory: CTA bilaterally, no wheezing, no rhonchi Abdominal: Soft, NT, ND, bowel sounds + Extremities: no edema, no cyanosis    The results of significant diagnostics from this hospitalization (including imaging, microbiology, ancillary and laboratory) are listed below for reference.     Microbiology: Recent Results (from the past 240 hour(s))  Respiratory Panel by RT PCR (Flu A&B, Covid) - Nasopharyngeal Swab     Status: None   Collection Time: 08/27/20  9:09 PM   Specimen: Nasopharyngeal Swab  Result Value Ref Range Status   SARS Coronavirus 2 by RT PCR NEGATIVE NEGATIVE Final    Comment: (NOTE) SARS-CoV-2 target nucleic acids are NOT DETECTED.  The SARS-CoV-2 RNA is generally detectable in upper respiratoy specimens during the acute phase of infection. The lowest concentration of SARS-CoV-2 viral copies this assay can detect is 131 copies/mL. A negative result does not preclude SARS-Cov-2 infection and should not be used as the sole basis for treatment or other patient management decisions. A negative result may occur with  improper specimen collection/handling, submission of specimen other than nasopharyngeal swab, presence of viral mutation(s) within the areas targeted by this assay, and inadequate number of viral copies (<131 copies/mL). A negative result must be combined with  clinical observations, patient history, and epidemiological information. The expected result is Negative.  Fact Sheet for Patients:  PinkCheek.be  Fact Sheet for Healthcare Providers:  GravelBags.it  This test is no t yet approved or cleared by the [place] FDA and  has been authorized for detection and/or  diagnosis of SARS-CoV-2 by FDA under an Emergency Use Authorization (EUA). This EUA will remain  in effect (meaning this test can be used) for the duration of the COVID-19 declaration under Section 564(b)(1) of the Act, 21 U.S.C. section 360bbb-3(b)(1), unless the authorization is terminated or revoked sooner.     Influenza A by PCR NEGATIVE NEGATIVE Final   Influenza B by PCR NEGATIVE NEGATIVE Final    Comment: (NOTE) The Xpert Xpress SARS-CoV-2/FLU/RSV assay is intended as an aid in  the diagnosis of influenza from Nasopharyngeal swab specimens and  should not be used as a sole basis for treatment. Nasal washings and  aspirates are unacceptable for Xpert Xpress SARS-CoV-2/FLU/RSV  testing.  Fact Sheet for Patients: PinkCheek.be  Fact Sheet for Healthcare Providers: GravelBags.it  This test is not yet approved or cleared by the [place] FDA and  has been authorized for detection and/or diagnosis of SARS-CoV-2 by  FDA under an Emergency Use Authorization (EUA). This EUA will remain  in effect (meaning this test can be used) for the duration of the  Covid-19 declaration under Section 564(b)(1) of the Act, 21  U.S.C. section 360bbb-3(b)(1), unless the authorization is  terminated or revoked. Performed at [facility], [place] [address], [place], [place] [address]   Culture, Urine     Status: Abnormal   Collection Time: 08/28/20  2:46 AM   Specimen: Urine, Random  Result Value Ref Range Status   Specimen Description URINE, RANDOM  Final   Special Requests   Final    NONE Performed at [facility], [place] [address], [place], [place] [address]    Culture MULTIPLE SPECIES PRESENT, SUGGEST RECOLLECTION (A)  Final   Report Status 08/29/2020 FINAL  Final  MRSA PCR Screening     Status: None   Collection Time: 08/29/20 12:37 AM   Specimen: Nasopharyngeal  Result Value Ref Range Status   MRSA by PCR NEGATIVE  NEGATIVE Final    Comment:        The GeneXpert MRSA Assay (FDA approved for NASAL specimens only), is one component of a comprehensive MRSA colonization surveillance program. It is not intended to diagnose MRSA infection nor to guide or monitor treatment for MRSA infections. Performed at [facility], [place] [address] [place], [place] [address]   SARS Coronavirus 2 by RT PCR (hospital order, performed in [facility] hospital lab) Nasopharyngeal Nasopharyngeal Swab     Status: None   Collection Time: 08/30/20  6:10 PM   Specimen: Nasopharyngeal Swab  Result Value Ref Range Status   SARS Coronavirus 2 NEGATIVE NEGATIVE Final    Comment: (NOTE) SARS-CoV-2 target nucleic acids are NOT DETECTED.  The SARS-CoV-2 RNA is generally detectable in upper and lower respiratory specimens during the acute phase of infection. The lowest concentration of SARS-CoV-2 viral copies this assay can detect is 250 copies / mL. A negative result does not preclude SARS-CoV-2 infection and should not be used as the sole basis for treatment or other patient management decisions.  A negative result may occur with improper specimen collection / handling, submission of specimen other than nasopharyngeal swab, presence of viral mutation(s) within the areas targeted by this  assay, and inadequate number of viral copies (<250 copies / mL). A negative result must be combined with clinical observations, patient history, and epidemiological information.  Fact Sheet for Patients:   StrictlyIdeas.no  Fact Sheet for Healthcare Providers: BankingDealers.co.za  This test is not yet approved or  cleared by the [place] FDA and has been authorized for detection and/or diagnosis of SARS-CoV-2 by FDA under an Emergency Use Authorization (EUA).  This EUA will remain in effect (meaning this test can be used) for the duration of the COVID-19 declaration under Section  564(b)(1) of the Act, 21 U.S.C. section 360bbb-3(b)(1), unless the authorization is terminated or revoked sooner.  Performed at [facility], [place] [address], [place], [place] [address]      Labs: BNP (last 3 results) No results for input(s): BNP in the last 8760 hours. Basic Metabolic Panel: Recent Labs  Lab 08/27/20 2035 08/28/20 0449 08/29/20 0915  NA 136 141 141  K 3.0* 3.1* 3.9  CL 96* 102 103  CO2 27 28 27   GLUCOSE 140* 119* 111*  BUN 18 11 10   CREATININE 1.26* 1.03* 1.05*  CALCIUM 9.3 8.9 9.0  MG  --  1.6* 2.2  PHOS  --   --  3.5   Liver Function Tests: Recent Labs  Lab 08/28/20 0449  AST 22  22  ALT 17  15  ALKPHOS 38  38  BILITOT 0.6  0.6  PROT 6.3*  6.3*  ALBUMIN 3.6  3.5   No results for input(s): LIPASE, AMYLASE in the last 168 hours. No results for input(s): AMMONIA in the last 168 hours. CBC: Recent Labs  Lab 08/27/20 2035 08/28/20 0449 08/29/20 0915  WBC 8.4 7.6 7.5  NEUTROABS 5.6 4.8  --   HGB 15.3* 14.5 15.0  HCT 47.2* 45.3 46.0  MCV 86.4 88.3 87.5  PLT 197 182 197   Cardiac Enzymes: Recent Labs  Lab 08/27/20 2035  CKTOTAL 49   BNP: Invalid input(s): POCBNP CBG: No results for input(s): GLUCAP in the last 168 hours. D-Dimer No results for input(s): DDIMER in the last 72 hours. Hgb A1c No results for input(s): HGBA1C in the last 72 hours. Lipid Profile No results for input(s): CHOL, HDL, LDLCALC, TRIG, CHOLHDL, LDLDIRECT in the last 72 hours. Thyroid function studies No results for input(s): TSH, T4TOTAL, T3FREE, THYROIDAB in the last 72 hours.  Invalid input(s): FREET3 Anemia work up No results for input(s): VITAMINB12, FOLATE, FERRITIN, TIBC, IRON, RETICCTPCT in the last 72 hours. Urinalysis    Component Value Date/Time   COLORURINE STRAW (A) 08/28/2020 0300   APPEARANCEUR CLEAR 08/28/2020 0300   LABSPEC 1.005 08/28/2020 0300   PHURINE 5.0 08/28/2020 0300   GLUCOSEU NEGATIVE 08/28/2020 0300   HGBUR  NEGATIVE 08/28/2020 0300   BILIRUBINUR NEGATIVE 08/28/2020 0300   KETONESUR NEGATIVE 08/28/2020 0300   PROTEINUR NEGATIVE 08/28/2020 0300   NITRITE NEGATIVE 08/28/2020 0300   LEUKOCYTESUR NEGATIVE 08/28/2020 0300   Sepsis Labs Invalid input(s): PROCALCITONIN,  WBC,  LACTICIDVEN Microbiology Recent Results (from the past 240 hour(s))  Respiratory Panel by RT PCR (Flu A&B, Covid) - Nasopharyngeal Swab     Status: None   Collection Time: 08/27/20  9:09 PM   Specimen: Nasopharyngeal Swab  Result Value Ref Range Status   SARS Coronavirus 2 by RT PCR NEGATIVE NEGATIVE Final    Comment: (NOTE) SARS-CoV-2 target nucleic acids are NOT DETECTED.  The SARS-CoV-2 RNA is generally detectable in upper respiratoy specimens during the acute phase of  infection. The lowest concentration of SARS-CoV-2 viral copies this assay can detect is 131 copies/mL. A negative result does not preclude SARS-Cov-2 infection and should not be used as the sole basis for treatment or other patient management decisions. A negative result may occur with  improper specimen collection/handling, submission of specimen other than nasopharyngeal swab, presence of viral mutation(s) within the areas targeted by this assay, and inadequate number of viral copies (<131 copies/mL). A negative result must be combined with clinical observations, patient history, and epidemiological information. The expected result is Negative.  Fact Sheet for Patients:  PinkCheek.be  Fact Sheet for Healthcare Providers:  GravelBags.it  This test is no t yet approved or cleared by the [place] FDA and  has been authorized for detection and/or diagnosis of SARS-CoV-2 by FDA under an Emergency Use Authorization (EUA). This EUA will remain  in effect (meaning this test can be used) for the duration of the COVID-19 declaration under Section 564(b)(1) of the Act, 21 U.S.C. section  360bbb-3(b)(1), unless the authorization is terminated or revoked sooner.     Influenza A by PCR NEGATIVE NEGATIVE Final   Influenza B by PCR NEGATIVE NEGATIVE Final    Comment: (NOTE) The Xpert Xpress SARS-CoV-2/FLU/RSV assay is intended as an aid in  the diagnosis of influenza from Nasopharyngeal swab specimens and  should not be used as a sole basis for treatment. Nasal washings and  aspirates are unacceptable for Xpert Xpress SARS-CoV-2/FLU/RSV  testing.  Fact Sheet for Patients: PinkCheek.be  Fact Sheet for Healthcare Providers: GravelBags.it  This test is not yet approved or cleared by the [place] FDA and  has been authorized for detection and/or diagnosis of SARS-CoV-2 by  FDA under an Emergency Use Authorization (EUA). This EUA will remain  in effect (meaning this test can be used) for the duration of the  Covid-19 declaration under Section 564(b)(1) of the Act, 21  U.S.C. section 360bbb-3(b)(1), unless the authorization is  terminated or revoked. Performed at [facility], [place] [address] [place], [place] [address]   Culture, Urine     Status: Abnormal   Collection Time: 08/28/20  2:46 AM   Specimen: Urine, Random  Result Value Ref Range Status   Specimen Description URINE, RANDOM  Final   Special Requests   Final    NONE Performed at [facility], [place] [address], [place], [place] [address]    Culture MULTIPLE SPECIES PRESENT, SUGGEST RECOLLECTION (A)  Final   Report Status 08/29/2020 FINAL  Final  MRSA PCR Screening     Status: None   Collection Time: 08/29/20 12:37 AM   Specimen: Nasopharyngeal  Result Value Ref Range Status   MRSA by PCR NEGATIVE NEGATIVE Final    Comment:        The GeneXpert MRSA Assay (FDA approved for NASAL specimens only), is one component of a comprehensive MRSA colonization surveillance program. It is not intended to diagnose MRSA infection nor to guide  or monitor treatment for MRSA infections. Performed at [facility], [name] [address], [place], [place] [address]   SARS Coronavirus 2 by RT PCR (hospital order, performed in [facility] hospital lab) Nasopharyngeal Nasopharyngeal Swab     Status: None   Collection Time: 08/30/20  6:10 PM   Specimen: Nasopharyngeal Swab  Result Value Ref Range Status   SARS Coronavirus 2 NEGATIVE NEGATIVE Final    Comment: (NOTE) SARS-CoV-2 target nucleic acids are NOT DETECTED.  The SARS-CoV-2 RNA is generally  detectable in upper and lower respiratory specimens during the acute phase of infection. The lowest concentration of SARS-CoV-2 viral copies this assay can detect is 250 copies / mL. A negative result does not preclude SARS-CoV-2 infection and should not be used as the sole basis for treatment or other patient management decisions.  A negative result may occur with improper specimen collection / handling, submission of specimen other than nasopharyngeal swab, presence of viral mutation(s) within the areas targeted by this assay, and inadequate number of viral copies (<250 copies / mL). A negative result must be combined with clinical observations, patient history, and epidemiological information.  Fact Sheet for Patients:   StrictlyIdeas.no  Fact Sheet for Healthcare Providers: BankingDealers.co.za  This test is not yet approved or  cleared by the [place] FDA and has been authorized for detection and/or diagnosis of SARS-CoV-2 by FDA under an Emergency Use Authorization (EUA).  This EUA will remain in effect (meaning this test can be used) for the duration of the COVID-19 declaration under Section 564(b)(1) of the Act, 21 U.S.C. section 360bbb-3(b)(1), unless the authorization is terminated or revoked sooner.  Performed at [facility], [place] [address], [place], [place] [address]      Time coordinating discharge: Over 30  minutes  SIGNED:   Shawna Clamp, MD  [facility] 08/31/2020, 2:13 PM Pager   If 7PM-7AM, please contact night-coverage [email or web address]

## 2020-08-31 NOTE — Progress Notes (Addendum)
   Palliative Medicine Inpatient Follow Up Note   Reason for consult:  Goals of Care  HPI:  Per intake H&P --> This24 years old female with PMH of coronary artery disease, hypertension, advanced dementia,paroxysmal A. fib, hyperlipidemia, presents to the emergency department status post unwitnessed fall.   Palliative care was asked to get involved to aid in goals of care conversations.  Today's Discussion (08/31/2020): Chart reviewed. I met with Colletta [place] and Tabitha at bedside. They had just gotten Nalia out of bed to the chair. Tresa was pleasantly disoriented and not noted to be in any distress. No significant event overnight had been reports.  Discussed the importance of continued conversation with family and their  medical providers regarding overall plan of care and treatment options, ensuring decisions are within the context of the patients values and GOCs.  Questions and concerns addressed   Objective Assessment: Vital Signs Vitals:   08/31/20 0802 08/31/20 0924  BP: 139/74 123/69  Pulse: (!) 59 (!) 58  Resp: 18   Temp: 98.1 F (36.7 C)   SpO2: 97%     Intake/Output Summary (Last 24 hours) at 08/31/2020 1102 Last data filed at 08/31/2020 0400 Gross per 24 hour  Intake 120 ml  Output 775 ml  Net -655 ml   Last Weight  Most recent update: 08/27/2020  8:19 PM   Weight  55 kg (121 lb 4.1 oz)           Gen: Elderly F in  NAD HEENT: Dry mucous membranes CV: Regular rate and rhythm, no murmurs rubs or gallops PULM: clear to auscultation bilaterally. No wheezes/rales/rhonchi  ABD: soft/nontender/nondistended/normal bowel sounds  EXT: No edema  Neuro: Alert and oriented x1  SUMMARY OF RECOMMENDATIONS DNAR/DNI  TOC - Outpatient palliative support  Will obtain copy of advance directives and scanned into [employer]  Spiritual support  Plan for transition to [facility] possibly as early as today   Falls: - Physical activity - Ensure wearing  eyeglasses or contact lenses - Ensure wearing hearing aid - Find out about the side effects of any medicine you take - Ensure adequate sleep - Stand up slowly - Use an assistive devices  - Wear non-skid, rubber-soled, low-heeled shoes, or lace-up shoes with non-skid soles  that fully support feet.   Time Spent: 15 Greater than 50% of the time was spent in counseling and coordination of care ______________________________________________________________________________________ [facility] Team Team Cell Phone: [phone] Please utilize secure chat with additional questions, if there is no response within 30 minutes please call the above phone number  Palliative Medicine Team providers are available by phone from 7am to 7pm daily and can be reached through the team cell phone.  Should this patient require assistance outside of these hours, please call the patient's attending physician.

## 2020-09-02 DIAGNOSIS — F0390 Unspecified dementia without behavioral disturbance: Secondary | ICD-10-CM | POA: Diagnosis not present

## 2020-09-02 DIAGNOSIS — I10 Essential (primary) hypertension: Secondary | ICD-10-CM | POA: Diagnosis not present

## 2020-09-02 DIAGNOSIS — E785 Hyperlipidemia, unspecified: Secondary | ICD-10-CM | POA: Diagnosis not present

## 2020-09-02 DIAGNOSIS — I4892 Unspecified atrial flutter: Secondary | ICD-10-CM | POA: Diagnosis not present

## 2020-09-04 DIAGNOSIS — R2681 Unsteadiness on feet: Secondary | ICD-10-CM | POA: Diagnosis not present

## 2020-09-05 DIAGNOSIS — F0390 Unspecified dementia without behavioral disturbance: Secondary | ICD-10-CM | POA: Diagnosis not present

## 2020-09-05 DIAGNOSIS — R2681 Unsteadiness on feet: Secondary | ICD-10-CM | POA: Diagnosis not present

## 2020-09-05 DIAGNOSIS — S065X9A Traumatic subdural hemorrhage with loss of consciousness of unspecified duration, initial encounter: Secondary | ICD-10-CM | POA: Diagnosis not present

## 2020-09-05 DIAGNOSIS — F419 Anxiety disorder, unspecified: Secondary | ICD-10-CM | POA: Diagnosis not present

## 2020-09-06 ENCOUNTER — Telehealth: Payer: Self-pay | Admitting: Interventional Cardiology

## 2020-09-06 NOTE — Telephone Encounter (Signed)
Pt c/o medication issue:  1. Name of Medication:  eloquis - do not see in med list   2. How are you currently taking this medication (dosage and times per day)?  Patient is not currently taking it   3. Are you having a reaction (difficulty breathing--STAT)?  no  4. What is your medication issue?  Daughter called in to state that patient recently fell and had to go to the hospital. She obtained a subdural hematoma and the hospital told her to stop taking eloquis. Daughter is wanting to check with Dr. Tamala Julian to see if he is okay with her coming off of the medication - also if he would like to see her sooner than 10/18/20 if she does stay off the meds  Please call/advise. Thank you!

## 2020-09-06 NOTE — Telephone Encounter (Signed)
Spoke with daughter and made her aware that I am going to send the message to Dr. Tamala Julian so he can review information from the hospital and give thoughts on Eliquis.  Daughter appreciative for call.   Pt currently scheduled for 10/18/20.  Do you need to see her sooner based off of this hospital visit?

## 2020-09-09 DIAGNOSIS — I10 Essential (primary) hypertension: Secondary | ICD-10-CM | POA: Diagnosis not present

## 2020-09-09 DIAGNOSIS — E785 Hyperlipidemia, unspecified: Secondary | ICD-10-CM | POA: Diagnosis not present

## 2020-09-09 DIAGNOSIS — I2510 Atherosclerotic heart disease of native coronary artery without angina pectoris: Secondary | ICD-10-CM | POA: Diagnosis not present

## 2020-09-09 DIAGNOSIS — I4892 Unspecified atrial flutter: Secondary | ICD-10-CM | POA: Diagnosis not present

## 2020-09-09 NOTE — Telephone Encounter (Signed)
Spoke with daughter and moved pt's appt up to 11/22.

## 2020-09-09 NOTE — Telephone Encounter (Signed)
Yes, since she had brain bleed.

## 2020-09-09 NOTE — Telephone Encounter (Signed)
Left message to call back  

## 2020-09-12 ENCOUNTER — Telehealth: Payer: Self-pay

## 2020-09-12 NOTE — Telephone Encounter (Signed)
4:00PM: Palliative care SW outreached patients daughter, [name], in an attempt to schedule initial palliative care visit.  Daughter shared that patient is currently at [facility] receiving rehab and anticipated returning back to [facility] once rehab is complete - in the next week or so. Palliative care team made aware.

## 2020-09-13 DIAGNOSIS — I4892 Unspecified atrial flutter: Secondary | ICD-10-CM | POA: Diagnosis not present

## 2020-09-13 DIAGNOSIS — I2510 Atherosclerotic heart disease of native coronary artery without angina pectoris: Secondary | ICD-10-CM | POA: Diagnosis not present

## 2020-09-13 DIAGNOSIS — E785 Hyperlipidemia, unspecified: Secondary | ICD-10-CM | POA: Diagnosis not present

## 2020-09-13 DIAGNOSIS — F0390 Unspecified dementia without behavioral disturbance: Secondary | ICD-10-CM | POA: Diagnosis not present

## 2020-09-13 DIAGNOSIS — I10 Essential (primary) hypertension: Secondary | ICD-10-CM | POA: Diagnosis not present

## 2020-09-16 DIAGNOSIS — E785 Hyperlipidemia, unspecified: Secondary | ICD-10-CM | POA: Diagnosis not present

## 2020-09-16 DIAGNOSIS — I10 Essential (primary) hypertension: Secondary | ICD-10-CM | POA: Diagnosis not present

## 2020-09-16 DIAGNOSIS — S065X9A Traumatic subdural hemorrhage with loss of consciousness of unspecified duration, initial encounter: Secondary | ICD-10-CM | POA: Diagnosis not present

## 2020-09-16 DIAGNOSIS — I4892 Unspecified atrial flutter: Secondary | ICD-10-CM | POA: Diagnosis not present

## 2020-09-20 ENCOUNTER — Telehealth: Payer: Self-pay

## 2020-09-20 NOTE — Telephone Encounter (Signed)
11:10AM: Palliative care SW outreached patients daughter, Marie Burch, to inquire of patients current status.  Daughter shared that patient is being discharged from [facility] to day and will be discharged to her (daughter) home temporarily due to [place] requesting that patient have 24/7 caregivers in place prior to returning. Daughter declined palliative care services/visit at this time.  SW will outreach again at later time once patient is more settled.

## 2020-09-24 DIAGNOSIS — Z1159 Encounter for screening for other viral diseases: Secondary | ICD-10-CM | POA: Diagnosis not present

## 2020-09-24 DIAGNOSIS — Z20828 Contact with and (suspected) exposure to other viral communicable diseases: Secondary | ICD-10-CM | POA: Diagnosis not present

## 2020-09-27 DIAGNOSIS — Z1159 Encounter for screening for other viral diseases: Secondary | ICD-10-CM | POA: Diagnosis not present

## 2020-09-27 DIAGNOSIS — Z20828 Contact with and (suspected) exposure to other viral communicable diseases: Secondary | ICD-10-CM | POA: Diagnosis not present

## 2020-09-30 ENCOUNTER — Encounter: Payer: Self-pay | Admitting: Interventional Cardiology

## 2020-09-30 ENCOUNTER — Ambulatory Visit: Payer: PPO | Admitting: Interventional Cardiology

## 2020-09-30 ENCOUNTER — Other Ambulatory Visit: Payer: Self-pay

## 2020-09-30 VITALS — BP 120/72 | HR 63 | Ht 62.0 in | Wt 111.8 lb

## 2020-09-30 DIAGNOSIS — Z9889 Other specified postprocedural states: Secondary | ICD-10-CM | POA: Diagnosis not present

## 2020-09-30 DIAGNOSIS — I2510 Atherosclerotic heart disease of native coronary artery without angina pectoris: Secondary | ICD-10-CM | POA: Diagnosis not present

## 2020-09-30 DIAGNOSIS — E785 Hyperlipidemia, unspecified: Secondary | ICD-10-CM

## 2020-09-30 DIAGNOSIS — I10 Essential (primary) hypertension: Secondary | ICD-10-CM | POA: Diagnosis not present

## 2020-09-30 DIAGNOSIS — Z7901 Long term (current) use of anticoagulants: Secondary | ICD-10-CM | POA: Diagnosis not present

## 2020-09-30 DIAGNOSIS — Z79899 Other long term (current) drug therapy: Secondary | ICD-10-CM | POA: Diagnosis not present

## 2020-09-30 DIAGNOSIS — I4892 Unspecified atrial flutter: Secondary | ICD-10-CM

## 2020-09-30 DIAGNOSIS — Z7189 Other specified counseling: Secondary | ICD-10-CM

## 2020-09-30 NOTE — Patient Instructions (Signed)
Medication Instructions:  Your physician recommends that you continue on your current medications as directed. Please refer to the Current Medication list given to you today.  *If you need a refill on your cardiac medications before your next appointment, please call your pharmacy*   Lab Work: Liver and TSH when you return  If you have labs (blood work) drawn today and your tests are completely normal, you will receive your results only by:  [place] (if you have MyChart) OR  A paper copy in the mail If you have any lab test that is abnormal or we need to change your treatment, we will call you to review the results.   Testing/Procedures: None   Follow-Up: At [facility], you and your health needs are our priority.  As part of our continuing mission to provide you with exceptional heart care, we have created designated Provider Care Teams.  These Care Teams include your primary Cardiologist (physician) and Advanced Practice Providers (APPs -  Physician Assistants and Nurse Practitioners) who all work together to provide you with the care you need, when you need it.  We recommend signing up for the patient portal called "MyChart".  Sign up information is provided on this After Visit Summary.  MyChart is used to connect with patients for Virtual Visits (Telemedicine).  Patients are able to view lab/test results, encounter notes, upcoming appointments, etc.  Non-urgent messages can be sent to your provider as well.   To learn more about what you can do with MyChart, go to [email or web address].    Your next appointment:   3 month(s)  The format for your next appointment:   In Person  Provider:   You may see Sinclair Grooms, MD or one of the following Advanced Practice Providers on your designated Care Team:    Truitt Merle, NP  Cecilie Kicks, NP  Kathyrn Drown, NP    Other Instructions

## 2020-09-30 NOTE — Progress Notes (Signed)
Cardiology Office Note:    Date:  09/30/2020   ID:  Marie, Burch 1934-03-14, MRN [id number]  PCP:  Lajean Manes, MD  Cardiologist:  Sinclair Grooms, MD   Referring MD: Lajean Manes, MD   Chief Complaint  Patient presents with  . Atrial Fibrillation  . Coronary Artery Disease  . Cardiac Valve Problem  . Advice Only    Anticoagulation    History of Present Illness:    Marie Burch is a 86 y.o. female with a hx of  a hx of Coronary artery disease S/P cabg AND mv REPAIR 2003, Paroxysmal atrial flutter (CARDIOVERSION 07/2020),  CHADS VASC 5, chronic anticoagulation - Apixaban, hypertension, hyperlipidemia, CKD III, Dementia, and h/o colon CA.  She had a fall with large subdural hematoma.  Apixaban was discontinued.  Amiodarone was started to help maintain sinus rhythm.  She is currently on 200 mg daily.  No complaints.  She has fallen twice at the rehab facility.  Past Medical History:  Diagnosis Date  . Anxiety   . Arthritis   . Blood transfusion 1960's   During Nephrectomy Procedure  . Cancer [facility]) 2001 and 2002   breast, right  . Cancer of colon ([place]) 08/03/12  . Chronic kidney disease    history of right nephrectomy due to stone '60's  . Complication of anesthesia    "I quit breathing" (patient reported it was due to PCN allergy though '60's)  . Coronary artery disease   . Depression   . Fall 08/28/2020  . GERD (gastroesophageal reflux disease)   . Heart murmur    s/p MV ring annuloplasty '03  . Hyperlipidemia   . Hypertension    dr Daneen Schick  . S/P radiation therapy 11/29/01 - 03/18/02   Right Breast: 5,040 cGy/28 Fractions with Boost for Total Dose  of 6,3000 cGy    Past Surgical History:  Procedure Laterality Date  . ABDOMINAL HYSTERECTOMY    . APPENDECTOMY    . APPENDECTOMY  08/03/2012   Procedure: APPENDECTOMY;  Surgeon: Madilyn Hook, DO;  Location: [facility];  Service: General;  Laterality: N/A;  incidental appendectomy  . BREAST  LUMPECTOMY    . BREAST SURGERY Right 2001  . catarects Bilateral few yrs ago  . COLON SURGERY  07-2012  . COLONOSCOPY WITH PROPOFOL N/A 08/08/2013   Procedure: COLONOSCOPY WITH PROPOFOL;  Surgeon: Garlan Fair, MD;  Location: [facility];  Service: Endoscopy;  Laterality: N/A;  . CORONARY ARTERY BYPASS GRAFT  2003   CABG X 2 (SVGs to DIAG and OM)/MV Repair '03  . DIAGNOSTIC LAPAROSCOPY    . EYE SURGERY      lump removed rt eye  . kidney removed  1960's  . LAPAROTOMY  08/03/2012   Procedure: LAPAROTOMY for Left  Hemicolectomy   ;  Surgeon: Madilyn Hook, DO;  Location: [place] [facility];  Service: General;  Laterality: N/A;  Mini Laparotomy  . PROCTOSCOPY  08/03/2012   Procedure: PROCTOSCOPY;  Surgeon: Madilyn Hook, DO;  Location: [facility];  Service: General;  Laterality: N/A;    Current Medications: Current Meds  Medication Sig  . alendronate (FOSAMAX) 70 MG tablet Take 70 mg by mouth once a week. Take with a full glass of water on an empty stomach.  Marland Kitchen amiodarone (PACERONE) 200 MG tablet Take 1 tablet by mouth twice a day for 2 weeks, then take 1 tablet by mouth once a day  . atenolol (TENORMIN) 25 MG tablet Take 25 mg by  mouth every morning.   . Calcium Carb-Cholecalciferol (CALCIUM+D3) 600-800 MG-UNIT TABS Take 1 tablet by mouth daily.  . clonazePAM (KLONOPIN) 0.5 MG tablet Take 0.5 mg by mouth at bedtime.   . hydrochlorothiazide (HYDRODIURIL) 25 MG tablet Take 25 mg by mouth daily.  Marland Kitchen NITROSTAT 0.4 MG SL tablet DISSOLVE 1 TABLET UNDER THE TONGUE EVERY5 MINUTES AS NEEDED UP TO 3 DOSES  . Omega-3 Fatty Acids (FISH OIL) 1000 MG CAPS Take 2,000 mg by mouth daily.   . polyethylene glycol (MIRALAX / GLYCOLAX) packet Take 17 g by mouth daily as needed (for constipation).  . potassium chloride SA (KLOR-CON) 20 MEQ tablet Take 20 mEq by mouth daily.  . sertraline (ZOLOFT) 100 MG tablet Take 100 mg by mouth daily.     Allergies:   Penicillins, Levaquin [levofloxacin], Niacin and related, Norvasc  [amlodipine besylate], Novocain [procaine hcl], Pravachol [pravastatin sodium], Statins, Sulfa antibiotics, and Zetia [ezetimibe]   Social History   Socioeconomic History  . Marital status: Married    Spouse name: Not on file  . Number of children: Not on file  . Years of education: Not on file  . Highest education level: Not on file  Occupational History  . Not on file  Tobacco Use  . Smoking status: Never Smoker  . Smokeless tobacco: Never Used  Vaping Use  . Vaping Use: Never used  Substance and Sexual Activity  . Alcohol use: No  . Drug use: No  . Sexual activity: Not Currently  Other Topics Concern  . Not on file  Social History Narrative   Married   Retired Optometrist         Social Determinants of Radio broadcast assistant Strain:   . Difficulty of Paying Living Expenses: Not on file  Food Insecurity:   . Worried About Charity fundraiser in the Last Year: Not on file  . Ran Out of Food in the Last Year: Not on file  Transportation Needs:   . Lack of Transportation (Medical): Not on file  . Lack of Transportation (Non-Medical): Not on file  Physical Activity:   . Days of Exercise per Week: Not on file  . Minutes of Exercise per Session: Not on file  Stress:   . Feeling of Stress : Not on file  Social Connections:   . Frequency of Communication with Friends and Family: Not on file  . Frequency of Social Gatherings with Friends and Family: Not on file  . Attends Religious Services: Not on file  . Active Member of Clubs or Organizations: Not on file  . Attends Archivist Meetings: Not on file  . Marital Status: Not on file     Family History: The patient's family history includes Heart disease in her father and mother.  ROS:   Please see the history of present illness.    Decreased memory all other systems reviewed and are negative.  EKGs/Labs/Other Studies Reviewed:    The following studies were reviewed today: No new data  EKG:  EKG  normal sinus rhythm, PACs, no pacing is noted.  Rightward axis and when compared to prior tracings, suspect limb lead malposition.  Recent Labs: 07/12/2020: TSH 1.107 08/28/2020: ALT 17; ALT 15 08/29/2020: BUN 10; Creatinine, Ser 1.05; Hemoglobin 15.0; Magnesium 2.2; Platelets 197; Potassium 3.9; Sodium 141  Recent Lipid Panel    Component Value Date/Time   CHOL 199 07/12/2020 1303   TRIG 181 (H) 07/12/2020 1303   HDL 33 (L) 07/12/2020 1303  CHOLHDL 6.0 07/12/2020 1303   VLDL 36 07/12/2020 1303   LDLCALC 130 (H) 07/12/2020 1303    Physical Exam:    VS:  BP 120/72   Pulse 63   Ht 5\' 2"  (1.575 m)   Wt 111 lb 12.8 oz (50.7 kg)   SpO2 95%   BMI 20.45 kg/m     Wt Readings from Last 3 Encounters:  09/30/20 111 lb 12.8 oz (50.7 kg)  08/27/20 121 lb 4.1 oz (55 kg)  08/16/20 121 lb (54.9 kg)     GEN: Elderly with ecchymosis around right eye.. No acute distress HEENT: Normal NECK: No JVD. LYMPHATICS: No lymphadenopathy CARDIAC:  RRR without murmur, gallop, or edema. VASCULAR:  Normal Pulses. No bruits. RESPIRATORY:  Clear to auscultation without rales, wheezing or rhonchi  ABDOMEN: Soft, non-tender, non-distended, No pulsatile mass, MUSCULOSKELETAL: No deformity  SKIN: Warm and dry NEUROLOGIC:  Alert and oriented x 3 PSYCHIATRIC:  Normal affect   ASSESSMENT:    1. Paroxysmal atrial flutter ([place])   2. Coronary artery disease involving native coronary artery of native heart without angina pectoris   3. Chronic anticoagulation   4. S/P MVR (mitral valve repair)   5. Essential hypertension   6. Hyperlipidemia, unspecified hyperlipidemia type   7. Educated about COVID-19 virus infection   8. On amiodarone therapy    PLAN:    In order of problems listed above:  1. Resolved and maintaining sinus rhythm on oral amiodarone 200 mg/day.  Will check TSH and liver panel on return in 3 months. 2. Stable without angina pectoris. 3. We have decided against anticoagulation therapy  given frequent recent falls and also significant subdural hematoma. 4. No significant regurgitation is heard 5. Blood pressure is 120/72 mmHg on current therapy.  Tenormin 25 mg/day.  HydroDIURIL 25 mg/day. 6. Not currently on therapy for lipids due to statin intolerance.  This may not be forced given her age and mental status. 7. Vaccinated 8. Liver panel and TSH will be done on return.  Continue 200 mg/day as a means of controlling atrial fibrillation   Medication Adjustments/Labs and Tests Ordered: Current medicines are reviewed at length with the patient today.  Concerns regarding medicines are outlined above.  Orders Placed This Encounter  Procedures  . EKG 12-Lead   No orders of the defined types were placed in this encounter.   Patient Instructions  Medication Instructions:  Your physician recommends that you continue on your current medications as directed. Please refer to the Current Medication list given to you today.  *If you need a refill on your cardiac medications before your next appointment, please call your pharmacy*   Lab Work: Liver and TSH when you return  If you have labs (blood work) drawn today and your tests are completely normal, you will receive your results only by: Marland Kitchen MyChart Message (if you have MyChart) OR . A paper copy in the mail If you have any lab test that is abnormal or we need to change your treatment, we will call you to review the results.   Testing/Procedures: None   Follow-Up: At [facility], you and your health needs are our priority.  As part of our continuing mission to provide you with exceptional heart care, we have created designated Provider Care Teams.  These Care Teams include your primary Cardiologist (physician) and Advanced Practice Providers (APPs -  Physician Assistants and Nurse Practitioners) who all work together to provide you with the care you need, when you need  it.  We recommend signing up for the patient portal  called "MyChart".  Sign up information is provided on this After Visit Summary.  MyChart is used to connect with patients for Virtual Visits (Telemedicine).  Patients are able to view lab/test results, encounter notes, upcoming appointments, etc.  Non-urgent messages can be sent to your provider as well.   To learn more about what you can do with MyChart, go to [email or web address].    Your next appointment:   3 month(s)  The format for your next appointment:   In Person  Provider:   You may see Sinclair Grooms, MD or one of the following Advanced Practice Providers on your designated Care Team:    Truitt Merle, NP  Cecilie Kicks, NP  Kathyrn Drown, NP    Other Instructions      Signed, Sinclair Grooms, MD  09/30/2020 4:42 PM    [place]

## 2020-10-10 ENCOUNTER — Encounter (HOSPITAL_COMMUNITY): Payer: Self-pay | Admitting: Emergency Medicine

## 2020-10-10 ENCOUNTER — Emergency Department (HOSPITAL_COMMUNITY): Payer: PPO

## 2020-10-10 ENCOUNTER — Other Ambulatory Visit: Payer: Self-pay

## 2020-10-10 ENCOUNTER — Inpatient Hospital Stay (HOSPITAL_COMMUNITY)
Admission: EM | Admit: 2020-10-10 | Discharge: 2020-10-14 | DRG: 085 | Disposition: A | Discharge status: Hospice | Payer: PPO | Attending: Internal Medicine | Admitting: Internal Medicine

## 2020-10-10 DIAGNOSIS — R4020 Unspecified coma: Secondary | ICD-10-CM | POA: Diagnosis not present

## 2020-10-10 DIAGNOSIS — R402142 Coma scale, eyes open, spontaneous, at arrival to emergency department: Secondary | ICD-10-CM | POA: Diagnosis present

## 2020-10-10 DIAGNOSIS — M1990 Unspecified osteoarthritis, unspecified site: Secondary | ICD-10-CM | POA: Diagnosis present

## 2020-10-10 DIAGNOSIS — Y92129 Unspecified place in nursing home as the place of occurrence of the external cause: Secondary | ICD-10-CM | POA: Diagnosis not present

## 2020-10-10 DIAGNOSIS — Z66 Do not resuscitate: Secondary | ICD-10-CM | POA: Diagnosis present

## 2020-10-10 DIAGNOSIS — Z905 Acquired absence of kidney: Secondary | ICD-10-CM

## 2020-10-10 DIAGNOSIS — M2550 Pain in unspecified joint: Secondary | ICD-10-CM | POA: Diagnosis not present

## 2020-10-10 DIAGNOSIS — I10 Essential (primary) hypertension: Secondary | ICD-10-CM | POA: Diagnosis present

## 2020-10-10 DIAGNOSIS — W1830XA Fall on same level, unspecified, initial encounter: Secondary | ICD-10-CM | POA: Diagnosis present

## 2020-10-10 DIAGNOSIS — Z880 Allergy status to penicillin: Secondary | ICD-10-CM

## 2020-10-10 DIAGNOSIS — S0181XA Laceration without foreign body of other part of head, initial encounter: Secondary | ICD-10-CM | POA: Diagnosis present

## 2020-10-10 DIAGNOSIS — F0391 Unspecified dementia with behavioral disturbance: Secondary | ICD-10-CM

## 2020-10-10 DIAGNOSIS — I480 Paroxysmal atrial fibrillation: Secondary | ICD-10-CM | POA: Diagnosis present

## 2020-10-10 DIAGNOSIS — G309 Alzheimer's disease, unspecified: Secondary | ICD-10-CM | POA: Diagnosis not present

## 2020-10-10 DIAGNOSIS — Z888 Allergy status to other drugs, medicaments and biological substances status: Secondary | ICD-10-CM

## 2020-10-10 DIAGNOSIS — N183 Chronic kidney disease, stage 3 unspecified: Secondary | ICD-10-CM | POA: Diagnosis present

## 2020-10-10 DIAGNOSIS — K219 Gastro-esophageal reflux disease without esophagitis: Secondary | ICD-10-CM | POA: Diagnosis not present

## 2020-10-10 DIAGNOSIS — Z20822 Contact with and (suspected) exposure to covid-19: Secondary | ICD-10-CM | POA: Diagnosis not present

## 2020-10-10 DIAGNOSIS — M5031 Other cervical disc degeneration,  high cervical region: Secondary | ICD-10-CM | POA: Diagnosis not present

## 2020-10-10 DIAGNOSIS — I129 Hypertensive chronic kidney disease with stage 1 through stage 4 chronic kidney disease, or unspecified chronic kidney disease: Secondary | ICD-10-CM | POA: Diagnosis present

## 2020-10-10 DIAGNOSIS — S065X9A Traumatic subdural hemorrhage with loss of consciousness of unspecified duration, initial encounter: Secondary | ICD-10-CM | POA: Diagnosis not present

## 2020-10-10 DIAGNOSIS — I4892 Unspecified atrial flutter: Secondary | ICD-10-CM | POA: Diagnosis not present

## 2020-10-10 DIAGNOSIS — S065X0A Traumatic subdural hemorrhage without loss of consciousness, initial encounter: Principal | ICD-10-CM | POA: Diagnosis present

## 2020-10-10 DIAGNOSIS — R296 Repeated falls: Secondary | ICD-10-CM | POA: Diagnosis present

## 2020-10-10 DIAGNOSIS — I2510 Atherosclerotic heart disease of native coronary artery without angina pectoris: Secondary | ICD-10-CM | POA: Diagnosis present

## 2020-10-10 DIAGNOSIS — F0281 Dementia in other diseases classified elsewhere with behavioral disturbance: Secondary | ICD-10-CM | POA: Diagnosis not present

## 2020-10-10 DIAGNOSIS — R402242 Coma scale, best verbal response, confused conversation, at arrival to emergency department: Secondary | ICD-10-CM | POA: Diagnosis present

## 2020-10-10 DIAGNOSIS — R58 Hemorrhage, not elsewhere classified: Secondary | ICD-10-CM | POA: Diagnosis not present

## 2020-10-10 DIAGNOSIS — E785 Hyperlipidemia, unspecified: Secondary | ICD-10-CM | POA: Diagnosis present

## 2020-10-10 DIAGNOSIS — R402362 Coma scale, best motor response, obeys commands, at arrival to emergency department: Secondary | ICD-10-CM | POA: Diagnosis present

## 2020-10-10 DIAGNOSIS — Z515 Encounter for palliative care: Secondary | ICD-10-CM | POA: Diagnosis not present

## 2020-10-10 DIAGNOSIS — W19XXXA Unspecified fall, initial encounter: Secondary | ICD-10-CM | POA: Diagnosis not present

## 2020-10-10 DIAGNOSIS — Z884 Allergy status to anesthetic agent status: Secondary | ICD-10-CM

## 2020-10-10 DIAGNOSIS — Z781 Physical restraint status: Secondary | ICD-10-CM

## 2020-10-10 DIAGNOSIS — G9341 Metabolic encephalopathy: Secondary | ICD-10-CM | POA: Diagnosis not present

## 2020-10-10 DIAGNOSIS — S065XAA Traumatic subdural hemorrhage with loss of consciousness status unknown, initial encounter: Secondary | ICD-10-CM | POA: Diagnosis present

## 2020-10-10 DIAGNOSIS — E440 Moderate protein-calorie malnutrition: Secondary | ICD-10-CM | POA: Diagnosis not present

## 2020-10-10 DIAGNOSIS — Z79899 Other long term (current) drug therapy: Secondary | ICD-10-CM

## 2020-10-10 DIAGNOSIS — Z951 Presence of aortocoronary bypass graft: Secondary | ICD-10-CM

## 2020-10-10 DIAGNOSIS — I440 Atrioventricular block, first degree: Secondary | ICD-10-CM | POA: Diagnosis present

## 2020-10-10 DIAGNOSIS — Z6820 Body mass index (BMI) 20.0-20.9, adult: Secondary | ICD-10-CM | POA: Diagnosis not present

## 2020-10-10 DIAGNOSIS — Z923 Personal history of irradiation: Secondary | ICD-10-CM

## 2020-10-10 DIAGNOSIS — Z7983 Long term (current) use of bisphosphonates: Secondary | ICD-10-CM

## 2020-10-10 DIAGNOSIS — R404 Transient alteration of awareness: Secondary | ICD-10-CM | POA: Diagnosis not present

## 2020-10-10 DIAGNOSIS — Z882 Allergy status to sulfonamides status: Secondary | ICD-10-CM

## 2020-10-10 DIAGNOSIS — M50323 Other cervical disc degeneration at C6-C7 level: Secondary | ICD-10-CM | POA: Diagnosis not present

## 2020-10-10 DIAGNOSIS — R52 Pain, unspecified: Secondary | ICD-10-CM | POA: Diagnosis not present

## 2020-10-10 DIAGNOSIS — Z853 Personal history of malignant neoplasm of breast: Secondary | ICD-10-CM

## 2020-10-10 DIAGNOSIS — Z9889 Other specified postprocedural states: Secondary | ICD-10-CM

## 2020-10-10 DIAGNOSIS — F03918 Unspecified dementia, unspecified severity, with other behavioral disturbance: Secondary | ICD-10-CM | POA: Diagnosis present

## 2020-10-10 DIAGNOSIS — Z7401 Bed confinement status: Secondary | ICD-10-CM | POA: Diagnosis not present

## 2020-10-10 DIAGNOSIS — Z85038 Personal history of other malignant neoplasm of large intestine: Secondary | ICD-10-CM

## 2020-10-10 DIAGNOSIS — Z8249 Family history of ischemic heart disease and other diseases of the circulatory system: Secondary | ICD-10-CM

## 2020-10-10 LAB — CBC
HCT: 46.9 % — ABNORMAL HIGH (ref 36.0–46.0)
Hemoglobin: 14.8 g/dL (ref 12.0–15.0)
MCH: 28.3 pg (ref 26.0–34.0)
MCHC: 31.6 g/dL (ref 30.0–36.0)
MCV: 89.7 fL (ref 80.0–100.0)
Platelets: 183 10*3/uL (ref 150–400)
RBC: 5.23 MIL/uL — ABNORMAL HIGH (ref 3.87–5.11)
RDW: 14.7 % (ref 11.5–15.5)
WBC: 7.5 10*3/uL (ref 4.0–10.5)
nRBC: 0 % (ref 0.0–0.2)

## 2020-10-10 LAB — BASIC METABOLIC PANEL
Anion gap: 12 (ref 5–15)
BUN: 14 mg/dL (ref 8–23)
CO2: 29 mmol/L (ref 22–32)
Calcium: 9.3 mg/dL (ref 8.9–10.3)
Chloride: 101 mmol/L (ref 98–111)
Creatinine, Ser: 1.33 mg/dL — ABNORMAL HIGH (ref 0.44–1.00)
GFR, Estimated: 39 mL/min — ABNORMAL LOW (ref >60)
Glucose, Bld: 121 mg/dL — ABNORMAL HIGH (ref 70–99)
Potassium: 3.8 mmol/L (ref 3.5–5.1)
Sodium: 142 mmol/L (ref 135–145)

## 2020-10-10 MED ORDER — ACETAMINOPHEN 325 MG PO TABS: 650.0000 mg | ORAL_TABLET | Freq: Four times a day (QID) | ORAL | Status: DC | PRN

## 2020-10-10 MED ORDER — ACETAMINOPHEN 650 MG RE SUPP: 650.0000 mg | Freq: Four times a day (QID) | RECTAL | Status: DC | PRN

## 2020-10-10 MED ORDER — LIDOCAINE-EPINEPHRINE 1 %-1:100000 IJ SOLN
20.0000 mL | Freq: Once | INTRAMUSCULAR | Status: DC
  Filled 2020-10-10: qty 1

## 2020-10-10 MED ORDER — METOPROLOL TARTRATE 5 MG/5ML IV SOLN
5.0000 mg | Freq: Four times a day (QID) | INTRAVENOUS | Status: DC
  Administered 2020-10-11: 5 mg via INTRAVENOUS
  Filled 2020-10-10: qty 5

## 2020-10-10 MED ORDER — DROPERIDOL 2.5 MG/ML IJ SOLN
2.5000 mg | Freq: Once | INTRAMUSCULAR | Status: AC
  Administered 2020-10-10: 2.5 mg via INTRAVENOUS
  Filled 2020-10-10: qty 2

## 2020-10-10 MED ORDER — LABETALOL HCL 5 MG/ML IV SOLN: 10.0000 mg | INTRAVENOUS | Status: DC | PRN

## 2020-10-10 MED ORDER — DEXTROSE-NACL 5-0.9 % IV SOLN: INTRAVENOUS | Status: DC

## 2020-10-10 NOTE — ED Notes (Signed)
Pt removed cardiac monitor and still pulling at equipment

## 2020-10-10 NOTE — ED Provider Notes (Signed)
[facility] EMERGENCY DEPARTMENT Provider Note   CSN: [id number] Arrival date & time: 10/10/20  1520     History Chief Complaint  Patient presents with  . Fall    Marie Burch is a 86 y.o. female with history of hyperlipidemia, hypertension, multiple falls, CAD, and CKD presents after a fall at facility. Per daughter, is a resident at [facility]. Has unsteady gait at baseline and dementia. Per daughter, likely had mechanical fall at the facility where she recently started living.   History limited secondary to patient's dementia.  The history is provided by a relative and the EMS personnel. The history is limited by the condition of the patient.  Trauma Mechanism of injury: fall Injury location: face Injury location detail: face Incident location: home Arrived directly from scene: yes       Past Medical History:  Diagnosis Date  . Anxiety   . Arthritis   . Blood transfusion 1960's   During Nephrectomy Procedure  . Cancer [facility]) 2001 and 2002   breast, right  . Cancer of colon ([place]) 08/03/12  . Chronic kidney disease    history of right nephrectomy due to stone '60's  . Complication of anesthesia    "I quit breathing" (patient reported it was due to PCN allergy though '60's)  . Coronary artery disease   . Depression   . Fall 08/28/2020  . GERD (gastroesophageal reflux disease)   . Heart murmur    s/p MV ring annuloplasty '03  . Hyperlipidemia   . Hypertension    dr Daneen Schick  . S/P radiation therapy 11/29/01 - 03/18/02   Right Breast: 5,040 cGy/28 Fractions with Boost for Total Dose  of 6,3000 cGy    Patient Active Problem List   Diagnosis Date Noted  . Falls frequently   . Palliative care by specialist   . Goals of care, counseling/discussion   . DNR (do not resuscitate)   . Subdural hematoma ([place]) 08/28/2020  . Mixed hyperlipidemia 08/28/2020  . Hypokalemia, inadequate intake 08/28/2020  . Hx of CABG 07/12/2020  . Dementia  with behavioral disturbance ([place]) 07/12/2020  . Pressure injury of skin 07/12/2020  . UTI (urinary tract infection) 07/12/2020  . Atrial flutter ([place]) 07/11/2020  . Paroxysmal atrial flutter ([place]) 07/10/2020  . S/P mitral valve repair 06/12/2014  . Coronary artery disease involving native coronary artery of native heart without angina pectoris 06/12/2014  . Essential hypertension 06/12/2014  . History of PSVT (paroxysmal supraventricular tachycardia) 06/12/2014  . Colon cancer ([place]) 08/29/2012    Past Surgical History:  Procedure Laterality Date  . ABDOMINAL HYSTERECTOMY    . APPENDECTOMY    . APPENDECTOMY  08/03/2012   Procedure: APPENDECTOMY;  Surgeon: Madilyn Hook, DO;  Location: [facility];  Service: General;  Laterality: N/A;  incidental appendectomy  . BREAST LUMPECTOMY    . BREAST SURGERY Right 2001  . catarects Bilateral few yrs ago  . COLON SURGERY  07-2012  . COLONOSCOPY WITH PROPOFOL N/A 08/08/2013   Procedure: COLONOSCOPY WITH PROPOFOL;  Surgeon: Garlan Fair, MD;  Location: [facility];  Service: Endoscopy;  Laterality: N/A;  . CORONARY ARTERY BYPASS GRAFT  2003   CABG X 2 (SVGs to DIAG and OM)/MV Repair '03  . DIAGNOSTIC LAPAROSCOPY    . EYE SURGERY      lump removed rt eye  . kidney removed  1960's  . LAPAROTOMY  08/03/2012   Procedure: LAPAROTOMY for Left  Hemicolectomy   ;  Surgeon:  Madilyn Hook, DO;  Location: [place];  Service: General;  Laterality: N/A;  Mini Laparotomy  . PROCTOSCOPY  08/03/2012   Procedure: PROCTOSCOPY;  Surgeon: Madilyn Hook, DO;  Location: [place];  Service: General;  Laterality: N/A;     OB History   No obstetric history on file.     Family History  Problem Relation Age of Onset  . Heart disease Mother   . Heart disease Father     Social History   Tobacco Use  . Smoking status: Never Smoker  . Smokeless tobacco: Never Used  Vaping Use  . Vaping Use: Never used  Substance Use Topics  . Alcohol use: No  . Drug use: No    Home  Medications Prior to Admission medications   Medication Sig Start Date End Date Taking? Authorizing Provider  alendronate (FOSAMAX) 70 MG tablet Take 70 mg by mouth once a week. Take with a full glass of water on an empty stomach.   Yes [provider]  amiodarone (PACERONE) 200 MG tablet Take 1 tablet by mouth twice a day for 2 weeks, then take 1 tablet by mouth once a day Patient taking differently: Take 200 mg by mouth daily. Take 1 tablet by mouth twice a day for 2 weeks, then take 1 tablet by mouth once a day 07/24/20  Yes Weaver, Scott T, PA-C  atenolol (TENORMIN) 25 MG tablet Take 25 mg by mouth every morning.  04/22/12  Yes [provider]  Calcium Carb-Cholecalciferol (CALCIUM+D3) 600-800 MG-UNIT TABS Take 1 tablet by mouth daily.   Yes [provider]  clonazePAM (KLONOPIN) 0.5 MG tablet Take 0.5 mg by mouth at bedtime.    Yes [provider]  hydrochlorothiazide (HYDRODIURIL) 25 MG tablet Take 25 mg by mouth daily.   Yes [provider]  NITROSTAT 0.4 MG SL tablet DISSOLVE 1 TABLET UNDER THE TONGUE EVERY5 MINUTES AS NEEDED UP TO 3 DOSES Patient taking differently: Place 0.4 mg under the tongue every 5 (five) minutes as needed for chest pain.  02/18/17  Yes Belva Crome, MD  Omega-3 Fatty Acids (FISH OIL) 1000 MG CAPS Take 2,000 mg by mouth daily.    Yes [provider]  polyethylene glycol (MIRALAX / GLYCOLAX) packet Take 17 g by mouth daily as needed (for constipation).   Yes [provider]  potassium chloride SA (KLOR-CON) 20 MEQ tablet Take 20 mEq by mouth daily.   Yes [provider]  sertraline (ZOLOFT) 100 MG tablet Take 100 mg by mouth daily.   Yes [provider]    Allergies    Penicillins, Levaquin [levofloxacin], Niacin and related, Norvasc [amlodipine besylate], Novocain [procaine hcl], Pravachol [pravastatin sodium], Statins, Sulfa antibiotics, and Zetia [ezetimibe]  Review of Systems     Review of Systems  Unable to perform ROS: Dementia    Physical Exam Updated Vital Signs BP (!) 145/90   Pulse (!) 57   Temp 98.5 F (36.9 C) (Oral)   Resp 13   Ht 5\' 2"  (1.575 m)   Wt 50.7 kg   SpO2 98%   BMI 20.45 kg/m   Physical Exam Vitals and nursing note reviewed.  Constitutional:      General: She is not in acute distress.    Appearance: Normal appearance. She is well-developed. She is not ill-appearing or toxic-appearing.  HENT:     Head: Normocephalic and atraumatic.      Comments: Two hemostatic linear lacerations to left forehead and left cheek.  Periorbital swelling and ecchymosis with a boggy hematoma over her left eyebrow.  Extraocular movements intact without pain.    Right Ear: External ear normal.     Left Ear: External ear normal.     Nose: Nose normal. No rhinorrhea.     Mouth/Throat:     Mouth: Mucous membranes are dry.     Pharynx: No oropharyngeal exudate.  Eyes:     Extraocular Movements: Extraocular movements intact.     Conjunctiva/sclera: Conjunctivae normal.     Pupils: Pupils are equal, round, and reactive to light.  Cardiovascular:     Rate and Rhythm: Normal rate and regular rhythm.     Pulses:          Radial pulses are 2+ on the right side and 2+ on the left side.       Posterior tibial pulses are 2+ on the right side and 2+ on the left side.     Heart sounds: No murmur heard.   Pulmonary:     Effort: Pulmonary effort is normal. No respiratory distress.     Breath sounds: Normal breath sounds.  Abdominal:     General: There is no distension.     Palpations: Abdomen is soft.     Tenderness: There is no abdominal tenderness.  Musculoskeletal:     Cervical back: Neck supple.     Thoracic back: Normal.     Lumbar back: Normal.     Comments: In C-collar  Skin:    General: Skin is warm and dry.     Findings: Bruising present.  Neurological:     Mental Status: She is alert. She is disoriented.     Cranial Nerves: No cranial nerve  deficit.     Sensory: No sensory deficit.     Motor: No weakness.     ED Results / Procedures / Treatments   Labs (all labs ordered are listed, but only abnormal results are displayed) Labs Reviewed  CBC - Abnormal; Notable for the following components:      Result Value   RBC 5.23 (*)    HCT 46.9 (*)    All other components within normal limits  BASIC METABOLIC PANEL - Abnormal; Notable for the following components:   Glucose, Bld 121 (*)    Creatinine, Ser 1.33 (*)    GFR, Estimated 39 (*)    All other components within normal limits  RESP PANEL BY RT-PCR (FLU A&B, COVID) [id number]  BASIC METABOLIC PANEL  CBC    EKG EKG Interpretation  Date/Time:  Thursday October 10 2020 16:09:20 EST Ventricular Rate:  61 PR Interval:    QRS Duration: 131 QT Interval:  471 QTC Calculation: 475 R Axis:   -69 Text Interpretation: Sinus rhythm Prolonged PR interval Nonspecific IVCD with LAD Confirmed by Lennice Sites [phone]) on 10/10/2020 4:12:21 PM   Radiology CT HEAD WO CONTRAST  Result Date: 10/10/2020 CLINICAL DATA:  Status post fall. EXAM: CT HEAD WITHOUT CONTRAST CT MAXILLOFACIAL WITHOUT CONTRAST CT CERVICAL SPINE WITHOUT CONTRAST TECHNIQUE: Multidetector CT imaging of the head, cervical spine, and maxillofacial structures were performed using the standard protocol without intravenous contrast. Multiplanar CT image reconstructions of the cervical spine and maxillofacial structures were also generated. COMPARISON:  08/28/2020 FINDINGS: CT HEAD FINDINGS Brain: Acute on chronic left subdural hematoma is identified overlying the left cerebral hemisphere. This measures approximately 1.4 cm in maximum thickness compared with 1.1 cm on 08/28/2020 acute hemorrhage is noted overlying the posterior parietal and temporal lobes, image  18/3. There is also a focus of acute hemorrhage overlying the medial left frontal lobe. Mass effect upon the underlying left cerebral hemisphere is noted and there is  left to right midline shift measuring 5 mm. Previously this measured 3 mm. There is mild diffuse low-attenuation within the subcortical and periventricular white matter compatible with chronic microvascular disease. Prominence of the sulci and ventricles identified compatible with brain atrophy. Vascular: No hyperdense vessel or unexpected calcification. Skull: No skull fracture Other: The paranasal sinuses and mastoid air cells are clear. CT MAXILLOFACIAL FINDINGS Osseous: No fracture or mandibular dislocation. No destructive process. Orbits: Negative. No traumatic or inflammatory finding. Sinuses: Clear. Soft tissues: Left lateral periorbital hematoma is identified with signs of left facial contusion and laceration. CT CERVICAL SPINE FINDINGS Alignment: Normal alignment of the cervical spine. Skull base and vertebrae: The vertebral body heights are well preserved. The facet joints are all well aligned. No fracture or dislocation identified. Soft tissues and spinal canal: No prevertebral fluid or swelling. No visible canal hematoma. Disc levels: Multi level degenerative disc disease is identified extending from C2-3 through C6-7. Upper chest: Negative. Other: None IMPRESSION: 1. Acute on chronic left subdural hematoma is identified overlying the left cerebral hemisphere. This measures approximately 1.4 cm in maximum thickness compared with 1.1 cm on 08/28/2020. Mass effect upon the underlying left cerebral hemisphere is noted and there is left to right midline shift measuring 5 mm. 2. Chronic small vessel ischemic change and brain atrophy. 3. No evidence for facial bone fracture. 4. Left lateral periorbital hematoma with signs of left facial contusion and laceration. 5. No evidence for cervical spine fracture or dislocation. 6. Cervical degenerative disc disease. Electronically Signed   By: Kerby Moors M.D.   On: 10/10/2020 19:35   CT CERVICAL SPINE WO CONTRAST  Result Date: 10/10/2020 CLINICAL DATA:  Status  post fall. EXAM: CT HEAD WITHOUT CONTRAST CT MAXILLOFACIAL WITHOUT CONTRAST CT CERVICAL SPINE WITHOUT CONTRAST TECHNIQUE: Multidetector CT imaging of the head, cervical spine, and maxillofacial structures were performed using the standard protocol without intravenous contrast. Multiplanar CT image reconstructions of the cervical spine and maxillofacial structures were also generated. COMPARISON:  08/28/2020 FINDINGS: CT HEAD FINDINGS Brain: Acute on chronic left subdural hematoma is identified overlying the left cerebral hemisphere. This measures approximately 1.4 cm in maximum thickness compared with 1.1 cm on 08/28/2020 acute hemorrhage is noted overlying the posterior parietal and temporal lobes, image 18/3. There is also a focus of acute hemorrhage overlying the medial left frontal lobe. Mass effect upon the underlying left cerebral hemisphere is noted and there is left to right midline shift measuring 5 mm. Previously this measured 3 mm. There is mild diffuse low-attenuation within the subcortical and periventricular white matter compatible with chronic microvascular disease. Prominence of the sulci and ventricles identified compatible with brain atrophy. Vascular: No hyperdense vessel or unexpected calcification. Skull: No skull fracture Other: The paranasal sinuses and mastoid air cells are clear. CT MAXILLOFACIAL FINDINGS Osseous: No fracture or mandibular dislocation. No destructive process. Orbits: Negative. No traumatic or inflammatory finding. Sinuses: Clear. Soft tissues: Left lateral periorbital hematoma is identified with signs of left facial contusion and laceration. CT CERVICAL SPINE FINDINGS Alignment: Normal alignment of the cervical spine. Skull base and vertebrae: The vertebral body heights are well preserved. The facet joints are all well aligned. No fracture or dislocation identified. Soft tissues and spinal canal: No prevertebral fluid or swelling. No visible canal hematoma. Disc levels:  Multi level degenerative disc disease is  identified extending from C2-3 through C6-7. Upper chest: Negative. Other: None IMPRESSION: 1. Acute on chronic left subdural hematoma is identified overlying the left cerebral hemisphere. This measures approximately 1.4 cm in maximum thickness compared with 1.1 cm on 08/28/2020. Mass effect upon the underlying left cerebral hemisphere is noted and there is left to right midline shift measuring 5 mm. 2. Chronic small vessel ischemic change and brain atrophy. 3. No evidence for facial bone fracture. 4. Left lateral periorbital hematoma with signs of left facial contusion and laceration. 5. No evidence for cervical spine fracture or dislocation. 6. Cervical degenerative disc disease. Electronically Signed   By: Kerby Moors M.D.   On: 10/10/2020 19:35   CT MAXILLOFACIAL WO CONTRAST  Result Date: 10/10/2020 CLINICAL DATA:  Status post fall. EXAM: CT HEAD WITHOUT CONTRAST CT MAXILLOFACIAL WITHOUT CONTRAST CT CERVICAL SPINE WITHOUT CONTRAST TECHNIQUE: Multidetector CT imaging of the head, cervical spine, and maxillofacial structures were performed using the standard protocol without intravenous contrast. Multiplanar CT image reconstructions of the cervical spine and maxillofacial structures were also generated. COMPARISON:  08/28/2020 FINDINGS: CT HEAD FINDINGS Brain: Acute on chronic left subdural hematoma is identified overlying the left cerebral hemisphere. This measures approximately 1.4 cm in maximum thickness compared with 1.1 cm on 08/28/2020 acute hemorrhage is noted overlying the posterior parietal and temporal lobes, image 18/3. There is also a focus of acute hemorrhage overlying the medial left frontal lobe. Mass effect upon the underlying left cerebral hemisphere is noted and there is left to right midline shift measuring 5 mm. Previously this measured 3 mm. There is mild diffuse low-attenuation within the subcortical and periventricular white matter compatible  with chronic microvascular disease. Prominence of the sulci and ventricles identified compatible with brain atrophy. Vascular: No hyperdense vessel or unexpected calcification. Skull: No skull fracture Other: The paranasal sinuses and mastoid air cells are clear. CT MAXILLOFACIAL FINDINGS Osseous: No fracture or mandibular dislocation. No destructive process. Orbits: Negative. No traumatic or inflammatory finding. Sinuses: Clear. Soft tissues: Left lateral periorbital hematoma is identified with signs of left facial contusion and laceration. CT CERVICAL SPINE FINDINGS Alignment: Normal alignment of the cervical spine. Skull base and vertebrae: The vertebral body heights are well preserved. The facet joints are all well aligned. No fracture or dislocation identified. Soft tissues and spinal canal: No prevertebral fluid or swelling. No visible canal hematoma. Disc levels: Multi level degenerative disc disease is identified extending from C2-3 through C6-7. Upper chest: Negative. Other: None IMPRESSION: 1. Acute on chronic left subdural hematoma is identified overlying the left cerebral hemisphere. This measures approximately 1.4 cm in maximum thickness compared with 1.1 cm on 08/28/2020. Mass effect upon the underlying left cerebral hemisphere is noted and there is left to right midline shift measuring 5 mm. 2. Chronic small vessel ischemic change and brain atrophy. 3. No evidence for facial bone fracture. 4. Left lateral periorbital hematoma with signs of left facial contusion and laceration. 5. No evidence for cervical spine fracture or dislocation. 6. Cervical degenerative disc disease. Electronically Signed   By: Kerby Moors M.D.   On: 10/10/2020 19:35    Procedures .Marland KitchenLaceration Repair  Date/Time: 10/10/2020 10:00 PM Performed by: Darrick Huntsman, MD Authorized by: Lennice Sites, DO   Consent:    Consent obtained:  Emergent situation Laceration details:    Location:  Face   Face location:   Forehead   Length (cm):  3 Repair type:    Repair type:  Simple Exploration:    Wound exploration:  entire depth of wound probed and visualized     Contaminated: no   Treatment:    Area cleansed with:  Saline   Amount of cleaning:  Extensive   Irrigation solution:  Sterile saline   Irrigation method:  Pressure wash   Visualized foreign bodies/material removed: no   Skin repair:    Repair method:  Steri-Strips and tissue adhesive Approximation:    Approximation:  Close Post-procedure details:    Patient tolerance of procedure:  Tolerated well, no immediate complications .Marland KitchenLaceration Repair  Date/Time: 10/11/2020 12:20 AM Performed by: Darrick Huntsman, MD Authorized by: Lennice Sites, DO   Consent:    Consent obtained:  Emergent situation Laceration details:    Location:  Face   Face location:  L cheek   Length (cm):  3 Repair type:    Repair type:  Simple Exploration:    Wound exploration: entire depth of wound probed and visualized     Contaminated: no   Treatment:    Area cleansed with:  Saline   Amount of cleaning:  Extensive   Irrigation solution:  Sterile saline   Irrigation method:  Pressure wash Skin repair:    Repair method:  Steri-Strips and tissue adhesive Approximation:    Approximation:  Close Post-procedure details:    Patient tolerance of procedure:  Tolerated well, no immediate complications     Medications Ordered in ED Medications  acetaminophen (TYLENOL) tablet 650 mg (has no administration in time range)    Or  acetaminophen (TYLENOL) suppository 650 mg (has no administration in time range)  dextrose 5 %-0.9 % sodium chloride infusion ( Intravenous New Bag/Given 10/10/20 2213)  metoprolol tartrate (LOPRESSOR) injection 5 mg (5 mg Intravenous Not Given 10/10/20 2339)  labetalol (NORMODYNE) injection 10 mg (has no administration in time range)  droperidol (INAPSINE) 2.5 MG/ML injection 2.5 mg (2.5 mg Intravenous Given 10/10/20 1807)    ED  Course  I have reviewed the triage vital signs and the nursing notes.  Pertinent labs & imaging results that were available during my care of the patient were reviewed by me and considered in my medical decision making (see chart for details).    MDM Rules/Calculators/A&P                          MDM: Marie Burch is a 86 y.o. female who presents with fall as per above. I have reviewed the nursing documentation for past medical history, family history, and social history. Pertinent previous records reviewed. She is awake, alert. HDS. Afebrile. Physical exam is most notable for 2 head lacerations, moving all 4 extremities, altered secondary to dementia but at baseline.  Labs: CBC and BMP unremarkable EKG: NSR, 1st-degree AV block, QRS 131, QTc 475.  Similar to prior. No signs of acute ischemia, infarct, or significant electrical abnormalities. No STEMI, ST depressions, or significant T wave inversions. No evidence of a High-Grade Conduction Block, WPW, Brugada Sign, ARVC, DeWinters T Waves, or Wellens Waves. Imaging: CT head with acute on chronic subdural with 5 mm midline shift.  No facial fractures.  No cervical spine fractures or other traumatic injuries. Consults: Neurosurgery Tx: 2.5 mg IV droperidol  Differential Dx: I am most concerned for traumatic acute on chronic subdural hematoma. Given history, physical exam, and work-up, I do not think she has traumatic subarachnoid hemorrhage, open fracture, entrapped ocular muscle, fracture or dislocation, impending herniation, intrathoracic injury, intra-abdominal injury, or uncontrolled bleeding.  MDM: Marie Burch is  a 86 y.o. female presents after a suspected mechanical fall at her nursing facility.  On arrival, GCS 14 secondary to dementia, ABCs intact.  Patient is able to converse, answer question, follows simple commands.  Per daughter (by phone), patient has had multiple mechanical falls recently but is no longer on anticoagulation due  to this.  Discussed plan for imaging and reassessment.  Neurosurgery consulted due to acute on chronic subdural.  Per neurosurgery, no acute intervention at this time.  We will plan admit to hospitalist.  Lacerations repaired at bedside.  Daughter requested that glue be used rather than stitches as she is concerned that the patient would disturb the stitches.  Wounds would most likely benefit from suturing but due to patient's dementia and agitation in the ED, feel that she likely will pull stitches out.  Thus, Steri-Strips were placed with overlying Dermabond on both lacerations and noted to best ensure closure in this patient with dementia.  Per nursing, patient was attempting to get out of bed, not following safety instructions.  Bilateral soft restraints were required for patient safety she was also given 2.5 mg IV droperidol secondary to agitation.  Handoff given to hospitalist.  Admitted in stable condition.  The plan for this patient was discussed with Dr. Ronnald Nian, who voiced agreement and who oversaw evaluation and treatment of this patient.   Final Clinical Impression(s) / ED Diagnoses Final diagnoses:  Acute subdural hematoma [facility])    Rx / DC Orders ED Discharge Orders    None       Saulo Anthis, MD 10/11/20 0023    Lennice Sites, DO 10/11/20 0028

## 2020-10-10 NOTE — H&P (Signed)
History and Physical    Marie Burch HER:[id number] DOB: 1934-04-19 DOA: 10/10/2020  PCP: Lajean Manes, MD  Patient coming from: Skilled nursing facility.  Chief Complaint: Fall.  History obtained from ER physician as patient's family was not able to be reached.  HPI: Marie Burch is a 86 y.o. female with history of advanced dementia, recently diagnosed paroxysmal atrial fibrillation was admitted in October about a month ago for a fall and at that time was found to have subdural hematoma and patient's Eliquis was discontinued presently on aspirin was brought in the ER the patient had another fall exact circumstances are not known but is felt to be mechanical also sustained a left facial laceration and left periorbital hematoma.  ED Course: In the ER patient had a CT head which shows acute on chronic left subdural hematoma with maximum thickness around 1.4 cm with 5 mm midline shift.  On-call neurosurgeon Dr. Christella Noa was consulted requested no additional work-up.  In the ER patient started getting very agitated and had to be given droperidol and placed on restraints.  EKG shows normal sinus rhythm.  Labs are largely unremarkable and appears to be missing at baseline.  Review of Systems: As per HPI, rest all negative.   Past Medical History:  Diagnosis Date  . Anxiety   . Arthritis   . Blood transfusion 1960's   During Nephrectomy Procedure  . Cancer [facility]) 2001 and 2002   breast, right  . Cancer of colon ([place]) 08/03/12  . Chronic kidney disease    history of right nephrectomy due to stone '60's  . Complication of anesthesia    "I quit breathing" (patient reported it was due to PCN allergy though '60's)  . Coronary artery disease   . Depression   . Fall 08/28/2020  . GERD (gastroesophageal reflux disease)   . Heart murmur    s/p MV ring annuloplasty '03  . Hyperlipidemia   . Hypertension    dr Daneen Schick  . S/P radiation therapy 11/29/01 - 03/18/02   Right  Breast: 5,040 cGy/28 Fractions with Boost for Total Dose  of 6,3000 cGy    Past Surgical History:  Procedure Laterality Date  . ABDOMINAL HYSTERECTOMY    . APPENDECTOMY    . APPENDECTOMY  08/03/2012   Procedure: APPENDECTOMY;  Surgeon: Madilyn Hook, DO;  Location: [facility];  Service: General;  Laterality: N/A;  incidental appendectomy  . BREAST LUMPECTOMY    . BREAST SURGERY Right 2001  . catarects Bilateral few yrs ago  . COLON SURGERY  07-2012  . COLONOSCOPY WITH PROPOFOL N/A 08/08/2013   Procedure: COLONOSCOPY WITH PROPOFOL;  Surgeon: Garlan Fair, MD;  Location: [facility];  Service: Endoscopy;  Laterality: N/A;  . CORONARY ARTERY BYPASS GRAFT  2003   CABG X 2 (SVGs to DIAG and OM)/MV Repair '03  . DIAGNOSTIC LAPAROSCOPY    . EYE SURGERY      lump removed rt eye  . kidney removed  1960's  . LAPAROTOMY  08/03/2012   Procedure: LAPAROTOMY for Left  Hemicolectomy   ;  Surgeon: Madilyn Hook, DO;  Location: [place];  Service: General;  Laterality: N/A;  Mini Laparotomy  . PROCTOSCOPY  08/03/2012   Procedure: PROCTOSCOPY;  Surgeon: Madilyn Hook, DO;  Location: [place];  Service: General;  Laterality: N/A;     reports that she has never smoked. She has never used smokeless tobacco. She reports that she does not drink alcohol and does not use drugs.  Allergies  Allergen Reactions  . Penicillins Anaphylaxis  . Levaquin [Levofloxacin] Other (See Comments)    PT TOLD TO LEAVE ANTIBIOTICS ALONE AFTER NEPHRECTOMY PT HAD REACTION TO ANTIBIOTICS  . Niacin And Related Other (See Comments)    unknown  . Norvasc [Amlodipine Besylate] Other (See Comments)    Unknown, PT NOT SURE  . Novocain [Procaine Hcl]     Numbness would not leave   . Pravachol [Pravastatin Sodium] Other (See Comments)    Unknown, ? Questionable muscle aches  . Statins Other (See Comments)    Unknown, questionable muscle aches  . Sulfa Antibiotics   . Zetia [Ezetimibe] Other (See Comments)    Unknown, pt not sure     Family History  Problem Relation Age of Onset  . Heart disease Mother   . Heart disease Father     Prior to Admission medications   Medication Sig Start Date End Date Taking? Authorizing Provider  alendronate (FOSAMAX) 70 MG tablet Take 70 mg by mouth once a week. Take with a full glass of water on an empty stomach.    [provider]  amiodarone (PACERONE) 200 MG tablet Take 1 tablet by mouth twice a day for 2 weeks, then take 1 tablet by mouth once a day 07/24/20   Richardson Dopp T, PA-C  atenolol (TENORMIN) 25 MG tablet Take 25 mg by mouth every morning.  04/22/12   [provider]  Calcium Carb-Cholecalciferol (CALCIUM+D3) 600-800 MG-UNIT TABS Take 1 tablet by mouth daily.    [provider]  clonazePAM (KLONOPIN) 0.5 MG tablet Take 0.5 mg by mouth at bedtime.     [provider]  hydrochlorothiazide (HYDRODIURIL) 25 MG tablet Take 25 mg by mouth daily.    [provider]  NITROSTAT 0.4 MG SL tablet DISSOLVE 1 TABLET UNDER THE TONGUE EVERY5 MINUTES AS NEEDED UP TO 3 DOSES 02/18/17   Belva Crome, MD  Omega-3 Fatty Acids (FISH OIL) 1000 MG CAPS Take 2,000 mg by mouth daily.     [provider]  polyethylene glycol (MIRALAX / GLYCOLAX) packet Take 17 g by mouth daily as needed (for constipation).    [provider]  potassium chloride SA (KLOR-CON) 20 MEQ tablet Take 20 mEq by mouth daily.    [provider]  sertraline (ZOLOFT) 100 MG tablet Take 100 mg by mouth daily.    [provider]    Physical Exam: Constitutional: Moderately built and nourished. Vitals:   10/10/20 1719 10/10/20 1802 10/10/20 1909 10/10/20 1941  BP: (!) 195/123 (!) 130/103 (!) 157/77 (!) 146/70  Pulse: (!) 115 65 60 (!) 59  Resp: (!) 22 20 20 20   Temp:      TempSrc:      SpO2: 97% 98% 98% 99%   Eyes: Anicteric no pallor. ENMT: Left-sided periorbital hematoma. Neck: No neck rigidity. Respiratory: No rhonchi or  crepitations. Cardiovascular: S1-S2 heard. Abdomen: Soft nontender bowel sounds present. Musculoskeletal: No edema. Skin: No rash.  Left irritable hematoma. Neurologic: Patient is alert awake but confused.  Presently on restraints.  Moving all extremities. Psychiatric: Appears confused.   Labs on Admission: I have personally reviewed following labs and imaging studies  CBC: Recent Labs  Lab 10/10/20 1621  WBC 7.5  HGB 14.8  HCT 46.9*  MCV 89.7  PLT 643   Basic Metabolic Panel: Recent Labs  Lab 10/10/20 1621  NA 142  K 3.8  CL 101  CO2 29  GLUCOSE 121*  BUN 14  CREATININE 1.33*  CALCIUM 9.3   GFR: Estimated Creatinine Clearance: 24.9 mL/min (A) (by C-G formula based on SCr of 1.33 mg/dL (H)). Liver Function Tests: No results for input(s): AST, ALT, ALKPHOS, BILITOT, PROT, ALBUMIN in the last 168 hours. No results for input(s): LIPASE, AMYLASE in the last 168 hours. No results for input(s): AMMONIA in the last 168 hours. Coagulation Profile: No results for input(s): INR, PROTIME in the last 168 hours. Cardiac Enzymes: No results for input(s): CKTOTAL, CKMB, CKMBINDEX, TROPONINI in the last 168 hours. BNP (last 3 results) No results for input(s): PROBNP in the last 8760 hours. HbA1C: No results for input(s): HGBA1C in the last 72 hours. CBG: No results for input(s): GLUCAP in the last 168 hours. Lipid Profile: No results for input(s): CHOL, HDL, LDLCALC, TRIG, CHOLHDL, LDLDIRECT in the last 72 hours. Thyroid Function Tests: No results for input(s): TSH, T4TOTAL, FREET4, T3FREE, THYROIDAB in the last 72 hours. Anemia Panel: No results for input(s): VITAMINB12, FOLATE, FERRITIN, TIBC, IRON, RETICCTPCT in the last 72 hours. Urine analysis:    Component Value Date/Time   COLORURINE STRAW (A) 08/28/2020 0300   APPEARANCEUR CLEAR 08/28/2020 0300   LABSPEC 1.005 08/28/2020 0300   PHURINE 5.0 08/28/2020 0300   GLUCOSEU NEGATIVE 08/28/2020 0300   HGBUR NEGATIVE  08/28/2020 0300   BILIRUBINUR NEGATIVE 08/28/2020 0300   KETONESUR NEGATIVE 08/28/2020 0300   PROTEINUR NEGATIVE 08/28/2020 0300   NITRITE NEGATIVE 08/28/2020 0300   LEUKOCYTESUR NEGATIVE 08/28/2020 0300   Sepsis Labs: @LABRCNTIP (procalcitonin:4,lacticidven:4) )No results found for this or any previous visit (from the past 240 hour(s)).   Radiological Exams on Admission: CT HEAD WO CONTRAST  Result Date: 10/10/2020 CLINICAL DATA:  Status post fall. EXAM: CT HEAD WITHOUT CONTRAST CT MAXILLOFACIAL WITHOUT CONTRAST CT CERVICAL SPINE WITHOUT CONTRAST TECHNIQUE: Multidetector CT imaging of the head, cervical spine, and maxillofacial structures were performed using the standard protocol without intravenous contrast. Multiplanar CT image reconstructions of the cervical spine and maxillofacial structures were also generated. COMPARISON:  08/28/2020 FINDINGS: CT HEAD FINDINGS Brain: Acute on chronic left subdural hematoma is identified overlying the left cerebral hemisphere. This measures approximately 1.4 cm in maximum thickness compared with 1.1 cm on 08/28/2020 acute hemorrhage is noted overlying the posterior parietal and temporal lobes, image 18/3. There is also a focus of acute hemorrhage overlying the medial left frontal lobe. Mass effect upon the underlying left cerebral hemisphere is noted and there is left to right midline shift measuring 5 mm. Previously this measured 3 mm. There is mild diffuse low-attenuation within the subcortical and periventricular white matter compatible with chronic microvascular disease. Prominence of the sulci and ventricles identified compatible with brain atrophy. Vascular: No hyperdense vessel or unexpected calcification. Skull: No skull fracture Other: The paranasal sinuses and mastoid air cells are clear. CT MAXILLOFACIAL FINDINGS Osseous: No fracture or mandibular dislocation. No destructive process. Orbits: Negative. No traumatic or inflammatory finding. Sinuses:  Clear. Soft tissues: Left lateral periorbital hematoma is identified with signs of left facial contusion and laceration. CT CERVICAL SPINE FINDINGS Alignment: Normal alignment of the cervical spine. Skull base and vertebrae: The vertebral body heights are well preserved. The facet joints are all well aligned. No fracture or dislocation identified. Soft tissues and spinal canal: No prevertebral fluid or swelling. No visible canal hematoma. Disc levels: Multi level degenerative disc disease is identified extending from C2-3 through C6-7. Upper chest: Negative. Other: None IMPRESSION: 1. Acute on chronic left subdural hematoma is identified overlying the left cerebral hemisphere. This  measures approximately 1.4 cm in maximum thickness compared with 1.1 cm on 08/28/2020. Mass effect upon the underlying left cerebral hemisphere is noted and there is left to right midline shift measuring 5 mm. 2. Chronic small vessel ischemic change and brain atrophy. 3. No evidence for facial bone fracture. 4. Left lateral periorbital hematoma with signs of left facial contusion and laceration. 5. No evidence for cervical spine fracture or dislocation. 6. Cervical degenerative disc disease. Electronically Signed   By: Kerby Moors M.D.   On: 10/10/2020 19:35   CT CERVICAL SPINE WO CONTRAST  Result Date: 10/10/2020 CLINICAL DATA:  Status post fall. EXAM: CT HEAD WITHOUT CONTRAST CT MAXILLOFACIAL WITHOUT CONTRAST CT CERVICAL SPINE WITHOUT CONTRAST TECHNIQUE: Multidetector CT imaging of the head, cervical spine, and maxillofacial structures were performed using the standard protocol without intravenous contrast. Multiplanar CT image reconstructions of the cervical spine and maxillofacial structures were also generated. COMPARISON:  08/28/2020 FINDINGS: CT HEAD FINDINGS Brain: Acute on chronic left subdural hematoma is identified overlying the left cerebral hemisphere. This measures approximately 1.4 cm in maximum thickness compared  with 1.1 cm on 08/28/2020 acute hemorrhage is noted overlying the posterior parietal and temporal lobes, image 18/3. There is also a focus of acute hemorrhage overlying the medial left frontal lobe. Mass effect upon the underlying left cerebral hemisphere is noted and there is left to right midline shift measuring 5 mm. Previously this measured 3 mm. There is mild diffuse low-attenuation within the subcortical and periventricular white matter compatible with chronic microvascular disease. Prominence of the sulci and ventricles identified compatible with brain atrophy. Vascular: No hyperdense vessel or unexpected calcification. Skull: No skull fracture Other: The paranasal sinuses and mastoid air cells are clear. CT MAXILLOFACIAL FINDINGS Osseous: No fracture or mandibular dislocation. No destructive process. Orbits: Negative. No traumatic or inflammatory finding. Sinuses: Clear. Soft tissues: Left lateral periorbital hematoma is identified with signs of left facial contusion and laceration. CT CERVICAL SPINE FINDINGS Alignment: Normal alignment of the cervical spine. Skull base and vertebrae: The vertebral body heights are well preserved. The facet joints are all well aligned. No fracture or dislocation identified. Soft tissues and spinal canal: No prevertebral fluid or swelling. No visible canal hematoma. Disc levels: Multi level degenerative disc disease is identified extending from C2-3 through C6-7. Upper chest: Negative. Other: None IMPRESSION: 1. Acute on chronic left subdural hematoma is identified overlying the left cerebral hemisphere. This measures approximately 1.4 cm in maximum thickness compared with 1.1 cm on 08/28/2020. Mass effect upon the underlying left cerebral hemisphere is noted and there is left to right midline shift measuring 5 mm. 2. Chronic small vessel ischemic change and brain atrophy. 3. No evidence for facial bone fracture. 4. Left lateral periorbital hematoma with signs of left facial  contusion and laceration. 5. No evidence for cervical spine fracture or dislocation. 6. Cervical degenerative disc disease. Electronically Signed   By: Kerby Moors M.D.   On: 10/10/2020 19:35   CT MAXILLOFACIAL WO CONTRAST  Result Date: 10/10/2020 CLINICAL DATA:  Status post fall. EXAM: CT HEAD WITHOUT CONTRAST CT MAXILLOFACIAL WITHOUT CONTRAST CT CERVICAL SPINE WITHOUT CONTRAST TECHNIQUE: Multidetector CT imaging of the head, cervical spine, and maxillofacial structures were performed using the standard protocol without intravenous contrast. Multiplanar CT image reconstructions of the cervical spine and maxillofacial structures were also generated. COMPARISON:  08/28/2020 FINDINGS: CT HEAD FINDINGS Brain: Acute on chronic left subdural hematoma is identified overlying the left cerebral hemisphere. This measures approximately 1.4 cm in maximum  thickness compared with 1.1 cm on 08/28/2020 acute hemorrhage is noted overlying the posterior parietal and temporal lobes, image 18/3. There is also a focus of acute hemorrhage overlying the medial left frontal lobe. Mass effect upon the underlying left cerebral hemisphere is noted and there is left to right midline shift measuring 5 mm. Previously this measured 3 mm. There is mild diffuse low-attenuation within the subcortical and periventricular white matter compatible with chronic microvascular disease. Prominence of the sulci and ventricles identified compatible with brain atrophy. Vascular: No hyperdense vessel or unexpected calcification. Skull: No skull fracture Other: The paranasal sinuses and mastoid air cells are clear. CT MAXILLOFACIAL FINDINGS Osseous: No fracture or mandibular dislocation. No destructive process. Orbits: Negative. No traumatic or inflammatory finding. Sinuses: Clear. Soft tissues: Left lateral periorbital hematoma is identified with signs of left facial contusion and laceration. CT CERVICAL SPINE FINDINGS Alignment: Normal alignment of the  cervical spine. Skull base and vertebrae: The vertebral body heights are well preserved. The facet joints are all well aligned. No fracture or dislocation identified. Soft tissues and spinal canal: No prevertebral fluid or swelling. No visible canal hematoma. Disc levels: Multi level degenerative disc disease is identified extending from C2-3 through C6-7. Upper chest: Negative. Other: None IMPRESSION: 1. Acute on chronic left subdural hematoma is identified overlying the left cerebral hemisphere. This measures approximately 1.4 cm in maximum thickness compared with 1.1 cm on 08/28/2020. Mass effect upon the underlying left cerebral hemisphere is noted and there is left to right midline shift measuring 5 mm. 2. Chronic small vessel ischemic change and brain atrophy. 3. No evidence for facial bone fracture. 4. Left lateral periorbital hematoma with signs of left facial contusion and laceration. 5. No evidence for cervical spine fracture or dislocation. 6. Cervical degenerative disc disease. Electronically Signed   By: Kerby Moors M.D.   On: 10/10/2020 19:35    EKG: Independently reviewed.  Normal sinus rhythm.  IVCD.  Assessment/Plan Principal Problem:   Subdural hematoma (HCC) Active Problems:   S/P mitral valve repair   Coronary artery disease involving native coronary artery of native heart without angina pectoris   Essential hypertension   Paroxysmal atrial flutter (HCC)   Hx of CABG   Dementia with behavioral disturbance ([place])   DNR (do not resuscitate)    1. Acute on chronic left-sided subdural hematoma status post fall for which neurosurgery was consulted and as per neurosurgeon Dr. Christella Noa no further work-up. 2. Agitation and encephalopathy with history of dementia was given droperidol in the ER and kept patient on restraints.  We will closely monitor for any further worsening.  Until then patient will be kept n.p.o. 3. History of proximal atrial fibrillation presently in sinus rhythm.   Since patient is n.p.o. I have kept patient on scheduled dose of IV metoprolol.  Once patient is more alert awake and able to swallow restart patient's oral dose of beta-blockers and amiodarone.  Not on anticoagulation because of recent subdural hematoma. 4. Hypertension uncontrolled we will keep patient on as needed IV labetalol until patient can take her home medication.  Patient is also on scheduled dose of IV metoprolol. 5. History of dementia with behavioral disturbances presently n.p.o. and in restraints.  Once patient is cooperative will restart her home medication. 6. Chronic kidney disease stage III creatinine appears to be at baseline.  Follow metabolic panel.   DVT prophylaxis: SCDs.  Avoiding anticoagulation due to subdural hematoma. Code Status: DNR. Family Communication: Unable to reach family. Disposition Plan:  Back to facility when stable. Consults called: Neurosurgery. Admission status: Observation.   Rise Patience MD [facility] Pager [phone].  If 7PM-7AM, please contact night-coverage [email or web address] Password [facility]  10/10/2020, 9:14 PM

## 2020-10-10 NOTE — Progress Notes (Signed)
Patient ID: Marie Burch, female   DOB: 1934/07/30, 86 y.o.   MRN: [id number] BP (!) 146/70   Pulse (!) 59   Temp 98.5 F (36.9 C) (Oral)   Resp 20   SpO2 99%  Films reviewed. The basal cisterns are widely patent due to cerebral atrophy. She has a large mainly chronic subdural hematoma. Given her dementia, lack of mass effect, and her overall exam, there is no indication for operative intervention.  She has had repeated falls and now is not anticoagulated for this reason. There is minimal difference between this scan and the scan from October other than the right chronic subdural hematoma has resolved. No skull fractures appreciated.

## 2020-10-10 NOTE — ED Notes (Signed)
Patient transported to CT 

## 2020-10-10 NOTE — ED Triage Notes (Signed)
Pt BIB [facility] from [facility]. Pt had a fall today. Pt presents with 2 lacerations to right forehead. Pt also had one tooth knocked out. VSS. NAD. Dementia @ baseline.

## 2020-10-10 NOTE — ED Notes (Signed)
Pt continuously climbing out of bed, over side rails and taking c -collar off. Pt re oriented to situation and they need for c collar and to stay in the stretcher. Pt continues to try and get out of the stretcher. Bed alarm placed

## 2020-10-10 NOTE — ED Notes (Signed)
Pt has relaxed at this time, ct scan aware

## 2020-10-10 NOTE — ED Provider Notes (Addendum)
I have personally seen and examined the patient. I have reviewed the documentation on PMH/FH/Soc Hx. I have discussed the plan of care with the resident and patient.  I have reviewed and agree with the resident's documentation. Please see associated encounter note.  Briefly, the patient is a 86 y.o. female here with suspected mechanical fall.  Laceration over the left side of the face is hemostatic.  Not on blood thinners.  Has bruising around the left eye.  Extraocular motions are intact.  She appears grossly neurologically intact.  Does not have any extremity tenderness including wrists and hips.  Clear breath sounds.  Overall appears to be at her baseline.  We will get a CT scan of her head, neck, face basic labs.  Anticipate discharge back to facility assuming no injuries.   8:34 PM patient with acute on chronic subdural left-sided. Agitated currently on exam and having to be in soft restraints. Was given droperidol. Seems to be improving and following commands better now. Lab work was overall unremarkable. Will admit for further observation and PT and OT. She is not on blood thinners. Dr. Christella Noa was made aware with neurosurgery but patient is DNR and would not be a surgical option.  This chart was dictated using voice recognition software.  Despite best efforts to proofread,  errors can occur which can change the documentation meaning.  .Critical Care Performed by: Lennice Sites, DO Authorized by: Lennice Sites, DO   Critical care provider statement:    Critical care time (minutes):  45   Critical care was necessary to treat or prevent imminent or life-threatening deterioration of the following conditions:  CNS failure or compromise   Critical care was time spent personally by me on the following activities:  Blood draw for specimens, development of treatment plan with patient or surrogate, discussions with consultants, discussions with primary provider, evaluation of patient's response to  treatment, examination of patient, ordering and review of laboratory studies, ordering and review of radiographic studies, ordering and performing treatments and interventions, pulse oximetry, re-evaluation of patient's condition, review of old charts and obtaining history from patient or surrogate     EKG Interpretation  Date/Time:  Thursday October 10 2020 16:09:20 EST Ventricular Rate:  61 PR Interval:    QRS Duration: 131 QT Interval:  471 QTC Calculation: 475 R Axis:   -69 Text Interpretation: Sinus rhythm Prolonged PR interval Nonspecific IVCD with LAD Confirmed by Lennice Sites [phone]) on 10/10/2020 4:12:21 PM         Lennice Sites, DO 10/10/20 [place], [place], DO 10/10/20 2035    Lennice Sites, DO 10/10/20 2036

## 2020-10-11 DIAGNOSIS — E440 Moderate protein-calorie malnutrition: Secondary | ICD-10-CM | POA: Diagnosis present

## 2020-10-11 LAB — BASIC METABOLIC PANEL
Anion gap: 14 (ref 5–15)
Anion gap: 11 (ref 5–15)
BUN: 9 mg/dL (ref 8–23)
BUN: 6 mg/dL — ABNORMAL LOW (ref 8–23)
CO2: 25 mmol/L (ref 22–32)
CO2: 26 mmol/L (ref 22–32)
Calcium: 9 mg/dL (ref 8.9–10.3)
Calcium: 9 mg/dL (ref 8.9–10.3)
Chloride: 104 mmol/L (ref 98–111)
Chloride: 104 mmol/L (ref 98–111)
Creatinine, Ser: 1.01 mg/dL — ABNORMAL HIGH (ref 0.44–1.00)
Creatinine, Ser: 0.88 mg/dL (ref 0.44–1.00)
GFR, Estimated: 55 mL/min — ABNORMAL LOW (ref >60)
GFR, Estimated: >60 mL/min (ref >60)
Glucose, Bld: 172 mg/dL — ABNORMAL HIGH (ref 70–99)
Glucose, Bld: 109 mg/dL — ABNORMAL HIGH (ref 70–99)
Potassium: 3.1 mmol/L — ABNORMAL LOW (ref 3.5–5.1)
Potassium: 3.5 mmol/L (ref 3.5–5.1)
Sodium: 143 mmol/L (ref 135–145)
Sodium: 141 mmol/L (ref 135–145)

## 2020-10-11 LAB — CBC
HCT: 45.3 % (ref 36.0–46.0)
Hemoglobin: 14.2 g/dL (ref 12.0–15.0)
MCH: 28.3 pg (ref 26.0–34.0)
MCHC: 31.3 g/dL (ref 30.0–36.0)
MCV: 90.2 fL (ref 80.0–100.0)
Platelets: 147 10*3/uL — ABNORMAL LOW (ref 150–400)
RBC: 5.02 MIL/uL (ref 3.87–5.11)
RDW: 14.6 % (ref 11.5–15.5)
WBC: 8.3 10*3/uL (ref 4.0–10.5)
nRBC: 0 % (ref 0.0–0.2)

## 2020-10-11 LAB — RESP PANEL BY RT-PCR (FLU A&B, COVID) ARPGX2
Influenza A by PCR: NEGATIVE
Influenza B by PCR: NEGATIVE
SARS Coronavirus 2 by RT PCR: NEGATIVE

## 2020-10-11 LAB — GLUCOSE, CAPILLARY
Glucose-Capillary: 126 mg/dL — ABNORMAL HIGH (ref 70–99)
Glucose-Capillary: 110 mg/dL — ABNORMAL HIGH (ref 70–99)

## 2020-10-11 LAB — MAGNESIUM: Magnesium: 1.7 mg/dL (ref 1.7–2.4)

## 2020-10-11 LAB — MRSA PCR SCREENING: MRSA by PCR: NEGATIVE

## 2020-10-11 MED ORDER — POLYETHYLENE GLYCOL 3350 17 G PO PACK: 17.0000 g | PACK | Freq: Every day | ORAL | Status: DC | PRN

## 2020-10-11 MED ORDER — ATENOLOL 25 MG PO TABS
25.0000 mg | ORAL_TABLET | Freq: Every morning | ORAL | Status: DC
  Administered 2020-10-11: 25 mg via ORAL
  Filled 2020-10-11: qty 1

## 2020-10-11 MED ORDER — CLONAZEPAM 0.5 MG PO TABS
0.5000 mg | ORAL_TABLET | Freq: Every day | ORAL | Status: DC
  Administered 2020-10-11 – 2020-10-13 (×3): 0.5 mg via ORAL
  Filled 2020-10-11 (×3): qty 1

## 2020-10-11 MED ORDER — SERTRALINE HCL 100 MG PO TABS
100.0000 mg | ORAL_TABLET | Freq: Every day | ORAL | Status: DC
  Administered 2020-10-11 – 2020-10-14 (×4): 100 mg via ORAL
  Filled 2020-10-11 (×4): qty 1

## 2020-10-11 MED ORDER — AMIODARONE HCL 200 MG PO TABS
200.0000 mg | ORAL_TABLET | Freq: Every day | ORAL | Status: DC
  Administered 2020-10-11 – 2020-10-14 (×4): 200 mg via ORAL
  Filled 2020-10-11 (×4): qty 1

## 2020-10-11 MED ORDER — POTASSIUM CHLORIDE CRYS ER 20 MEQ PO TBCR
20.0000 meq | EXTENDED_RELEASE_TABLET | Freq: Every day | ORAL | Status: DC
  Administered 2020-10-11: 20 meq via ORAL
  Filled 2020-10-11: qty 1

## 2020-10-11 MED ORDER — NITROGLYCERIN 0.4 MG SL SUBL: 0.4000 mg | SUBLINGUAL_TABLET | SUBLINGUAL | Status: DC | PRN

## 2020-10-11 MED ORDER — CALCIUM CARBONATE-VITAMIN D 500-200 MG-UNIT PO TABS
1.0000 | ORAL_TABLET | Freq: Every day | ORAL | Status: DC
  Administered 2020-10-11: 17:00:00 1 via ORAL
  Filled 2020-10-11: qty 1

## 2020-10-11 MED ORDER — ADULT MULTIVITAMIN W/MINERALS CH
1.0000 | ORAL_TABLET | Freq: Every day | ORAL | Status: DC
  Administered 2020-10-11: 1 via ORAL
  Filled 2020-10-11: qty 1

## 2020-10-11 MED ORDER — POTASSIUM CHLORIDE 10 MEQ/100ML IV SOLN
10.0000 meq | INTRAVENOUS | Status: AC
  Administered 2020-10-11 (×4): 10 meq via INTRAVENOUS
  Filled 2020-10-11 (×4): qty 100

## 2020-10-11 NOTE — Progress Notes (Signed)
Pt's daughter, [name], called requesting update. She was updated on pt status. [name] also said that at time of discharge she would not want for her mother to go to rehab. She said that last time her mother had a fall she went to rehab and it was a terrible experience. [name] will want the pt to go directly back to [facility].   Justice Rocher, RN

## 2020-10-11 NOTE — Plan of Care (Signed)
  Problem: Health Behavior/Discharge Planning: Goal: Ability to manage health-related needs will improve Outcome: Not Progressing    Pt in bilateral wrist restraints at this time.

## 2020-10-11 NOTE — Evaluation (Signed)
Physical Therapy Evaluation Patient Details Name: Marie Burch MRN: [id number] DOB: 02-08-1934 Today's Date: 10/11/2020   History of Present Illness  Pt adm after fall and suffered acute on chronic lt subdural hematoma. Neurosurgery consulted and no intervention needed. PMH - advanced dementia, SDH (10/21), HTN, CAD, CKD  Clinical Impression  Pt presents to PT with decr mobility and high risk to fall. Pt currently requires assist for all mobility. Daughter wants pt to return to [facility]. With pt's dementia she can benefit from consistency of location and staff. Pt will continue to be a very high fall risk wherever she resides. Hopefully [employer] will be able to assist patient at current level and she could continue to get HHPT there.     Follow Up Recommendations Other (comment);Supervision/Assistance - 24 hour;Supervision for mobility/OOB;Home health PT (Back to  if they can provide needed assist)    Equipment Recommendations  Other (comment) (rollator)    Recommendations for Other Services       Precautions / Restrictions Precautions Precautions: Fall      Mobility  Bed Mobility Overal bed mobility: Needs Assistance Bed Mobility: Supine to Sit;Sit to Supine     Supine to sit: Min assist;HOB elevated Sit to supine: Min assist;HOB elevated   General bed mobility comments: Assist to bring hips to EOB in sitting. Assist to bring legs back into bed returning to supine.    Transfers Overall transfer level: Needs assistance Equipment used: 4-wheeled walker Transfers: Sit to/from Stand Sit to Stand: Min assist         General transfer comment: Assist to bring hips up and for balance  Ambulation/Gait Ambulation/Gait assistance: Min assist;Mod assist Gait Distance (Feet): 150 Feet Assistive device: 4-wheeled walker Gait Pattern/deviations: Step-through pattern;Decreased step length - right;Decreased step length - left;Shuffle;Leaning  posteriorly Gait velocity: decr Gait velocity interpretation: <1.8 ft/sec, indicate of risk for recurrent falls General Gait Details: Assist for balance or support. Primarily min assist except if pt becomes distracted especially when turning causing a posterior lean.   Stairs            Wheelchair Mobility    Modified Rankin (Stroke Patients Only)       Balance Overall balance assessment: Needs assistance Sitting-balance support: No upper extremity supported;Feet supported Sitting balance-Leahy Scale: Fair     Standing balance support: Bilateral upper extremity supported;No upper extremity supported;During functional activity Standing balance-Leahy Scale: Poor Standing balance comment: Min assist for static standing if pt with UE support. When pt uses UE's (pulling up brief) pt with heavy posterior lean requiring mod assist                              Pertinent Vitals/Pain Pain Assessment: Faces Faces Pain Scale: No hurt    Home Living Family/patient expects to be discharged to:: Assisted living                 Additional Comments: unknown    Prior Function Level of Independence: Needs assistance   Gait / Transfers Assistance Needed: During her recent admission was amb short distance with assist with walker. Pt unable to state her mobility           Hand Dominance        Extremity/Trunk Assessment   Upper Extremity Assessment Upper Extremity Assessment: Defer to OT evaluation    Lower Extremity Assessment Lower Extremity Assessment: Generalized weakness  Communication   Communication: No difficulties  Cognition Arousal/Alertness: Awake/alert Behavior During Therapy: Restless Overall Cognitive Status: No family/caregiver present to determine baseline cognitive functioning                                 General Comments: Pt with history of dementia. Pt oriented only to self, easily distracted, follows 1-step  commands inconsistently, and needs verbal/tactile cues      General Comments General comments (skin integrity, edema, etc.): VSS    Exercises     Assessment/Plan    PT Assessment Patient needs continued PT services  PT Problem List Decreased strength;Decreased balance;Decreased mobility;Decreased cognition;Decreased safety awareness       PT Treatment Interventions DME instruction;Gait training;Functional mobility training;Therapeutic activities;Therapeutic exercise;Balance training;Patient/family education    PT Goals (Current goals can be found in the Care Plan section)  Acute Rehab PT Goals Patient Stated Goal: not stated PT Goal Formulation: Patient unable to participate in goal setting Time For Goal Achievement: 10/25/20 Potential to Achieve Goals: Fair    Frequency Min 3X/week   Barriers to discharge        Co-evaluation               AM-PAC PT "6 Clicks" Mobility  Outcome Measure Help needed turning from your back to your side while in a flat bed without using bedrails?: A Little Help needed moving from lying on your back to sitting on the side of a flat bed without using bedrails?: A Little Help needed moving to and from a bed to a chair (including a wheelchair)?: A Little Help needed standing up from a chair using your arms (e.g., wheelchair or bedside chair)?: A Little Help needed to walk in hospital room?: A Little Help needed climbing 3-5 steps with a railing? : Total 6 Click Score: 16    End of Session Equipment Utilized During Treatment: Gait belt Activity Tolerance: Patient tolerated treatment well Patient left: in bed;with call bell/phone within reach;with bed alarm set;with restraints reapplied;with SCD's reapplied Nurse Communication: Mobility status PT Visit Diagnosis: Unsteadiness on feet (R26.81);Other abnormalities of gait and mobility (R26.89);Repeated falls (R29.6);Muscle weakness (generalized) (M62.81)    Time: 0940-7680 PT Time  Calculation (min) (ACUTE ONLY): 33 min   Charges:   PT Evaluation $PT Eval Moderate Complexity: 1 Mod PT Treatments $Gait Training: 8-22 mins        [place] Pager [phone] Office [place] 10/11/2020, 4:41 PM

## 2020-10-11 NOTE — Progress Notes (Addendum)
PROGRESS NOTE  Mickie Badders OEV:[id number] DOB: 07-12-1934 DOA: 10/10/2020 PCP: Lajean Manes, MD  Brief History   The patient is a 86 yr old woman who suffers from Alzheimer's Dementia which has worsened in the last 1-2 months after her first SDH in 08/2020.  Her daughter feels that this is due to multiple transitions from facility to facility and room to room. At the time of the first SDH she was on Eliquis. It was discontinued, and she was on aspirin when she fell again on 10/10/2020.   The fall was believed to be a mechanical fall. She sustained a left facial laceration and left periorbital hematoma. The patient was brought to the [facility] ED. CT head was performed in the ED which demonstrated an acute on chronic left subdural hematoma with a maximum thickness of 1.4 cm with 5 mm midline shift. Dr. Christella Noa was call to consult for neurosurgery. He evaluated the patient and determined that no further intervention was required.   In the ED the patient became very agitated and confused. She had to be placed on restraints and she was given Droperidol. Laboratory work up and EKG were unremarkalbe. CT of the C-spine was performed and demonstrated oly multi level degenerative disc disease C2-3 through C6-7. There was no evidence for cervical spine fracture or dislocation.   Consultants  . Neurosurgery  Procedures  . None  Antibiotics   Anti-infectives (From admission, onward)   None    .  Subjective  The patient is lying in bed with a C-collar on. She is disoriented. No new complaints.  Objective   Vitals:  Vitals:   10/11/20 0851 10/11/20 1226  BP: (!) 164/77 (!) 166/78  Pulse: (!) 57 (!) 59  Resp: 17 (!) 25  Temp: (!) 97.5 F (36.4 C) (!) 96.1 F (35.6 C)  SpO2: 98% 97%   I have personally reviewed the following:   Today's Data  . VitalsBMP  Imaging  . CT head . Ct C-spine  Scheduled Meds: . metoprolol tartrate  5 mg Intravenous Q6H   Continuous Infusions: .  dextrose 5 % and 0.9% NaCl 75 mL/hr at 10/11/20 8299    Principal Problem:   Subdural hematoma (HCC) Active Problems:   S/P mitral valve repair   Coronary artery disease involving native coronary artery of native heart without angina pectoris   Essential hypertension   Paroxysmal atrial flutter (HCC)   Hx of CABG   Dementia with behavioral disturbance ([place])   DNR (do not resuscitate)   LOS: 0 days   A & P  Acute on chronic left-sided subdural hematoma: Second SDH since 08/2020. It is status post fall for which neurosurgery was consulted and as per neurosurgeon Dr. Christella Noa no further work-up is necessary. The patient is still in a C-collar, although her CT C-spine demonstrated no acute fracture. Will remove it. Will continue to monitor the patient and have her evaluated by PT/OT. Anticipate that patient will return to her prior facility as is the wish of her daughter.  Agitation and encephalopathy with history of dementia was given droperidol in the ER and kept patient on restraints.  This mroning the patient has passed a speech eval. She has been restarted on a diet and her oral medications. The patient is disoriented, but this is not unusual according to her daughter.   History of proximal atrial fibrillation presently in sinus rhythm.  Since patient is n.p.o. I have kept patient on scheduled dose of IV metoprolol.  This will  be stopped and the patient will be continued on her home doses of oral metoprolol and amiodarone. She is on no anticoagulation given her history of SDH.  Hypertension uncontrolled we will keep patient on as needed IV labetalol until patient can take her home medication.  Patient is also on scheduled dose of IV metoprolol.  History of dementia with behavioral disturbances presently n.p.o. and in restraints.  Pt is more cooperative and closer to her baseline this morning. She has passed a swallow screen, has been given a diet and is restarted on her oral medications.  According to her daughter she sounds like she is not far from her baseline which has been worsening lately. The daughter expressed interest in talking with palliative care. They have been consulted.   Chronic kidney disease stage III creatinine appears to be at baseline. Monitor creatinine, electrolytes, and volume status.  I have seen and examined this patient myself. I have spent 46 minutes in her evaluation and care. More than 50% of this has been spent in counselling with the patient's daughter.  DVT prophylaxis: SCDs.  Avoiding anticoagulation due to subdural hematoma. Code Status: DNR. Family Communication: Discussed with daughter [name] in detail. Disposition Plan: Back to facility when stable.  Toran Murch, DO [facility] Direct contact: see [email or web address]  7PM-7AM contact night coverage as above 10/11/2020, 3:58 PM  LOS: 0 days

## 2020-10-11 NOTE — Progress Notes (Signed)
Patient arrived to room from ED, Safety precautions and orders reviewed with patient/family. TELE applied and confirmed

## 2020-10-11 NOTE — Progress Notes (Signed)
Initial Nutrition Assessment  DOCUMENTATION CODES:   Non-severe (moderate) malnutrition in context of chronic illness  INTERVENTION:   -MVI with minerals daily -Feeding assistance with meals -Magic cup TID with meals, each supplement provides 290 kcal and 9 grams of protein -Hormel Shake TID with meals, each supplement provides 520 kcals and 22 grams protein -RD will follow for diet advancement and adjust supplement regimen as appropriate  NUTRITION DIAGNOSIS:   Moderate Malnutrition related to chronic illness (dementia) as evidenced by percent weight loss, mild fat depletion, moderate fat depletion, mild muscle depletion, moderate muscle depletion.  GOAL:   Patient will meet greater than or equal to 90% of their needs  MONITOR:   PO intake, Supplement acceptance, Labs, Weight trends, Skin, I & O's  REASON FOR ASSESSMENT:   Malnutrition Screening Tool    ASSESSMENT:   Marie Burch is a 86 y.o. female with history of advanced dementia, recently diagnosed paroxysmal atrial fibrillation was admitted in October about a month ago for a fall and at that time was found to have subdural hematoma and patient's Eliquis was discontinued presently on aspirin was brought in the ER the patient had another fall exact circumstances are not known but is felt to be mechanical also sustained a left facial laceration and left periorbital hematoma.  Pt admitted with acute lt sided SDH.   12/3- s/p BSE- advanced to dysphagia 1 diet with thin liquids  Reviewed I/O's: +75 ml x 24 hours  Pt pleasantly confused at time of visit. She reports she thinks she is at "the school" and is looking for her daughter, Marie Burch. RD reoriented pt to place and situation.   Pt reports good appetite PTA. She estimates she consumes 2 meals per day. She is unable to provide accurate history secondary to dementia. Unable to locate SNF records at this time, so unsure what diet pt was on PTA.   Per pt, her UBW is  around 145#, but unsure how much weight she has lost or when weight loss occurred. Reviewed wt hx; pt has experienced a 7.8% wt loss over the past 3 months, which is significant for time frame.   Medications reviewed and include dextrose 5%-0.9% sodium chloride infusion @ 75 ml/hr.   Labs reviewed: CBGS: 126. K: 3.1.   NUTRITION - FOCUSED PHYSICAL EXAM:    Most Recent Value  Orbital Region Moderate depletion  Upper Arm Region Mild depletion  Thoracic and Lumbar Region No depletion  Buccal Region Mild depletion  Temple Region Moderate depletion  Clavicle Bone Region Mild depletion  Clavicle and Acromion Bone Region Mild depletion  Scapular Bone Region Mild depletion  Dorsal Hand Moderate depletion  Patellar Region Mild depletion  Anterior Thigh Region Mild depletion  Posterior Calf Region Mild depletion  Edema (RD Assessment) Mild  Hair Reviewed  Mouth Reviewed  Skin Reviewed  Nails Reviewed       Diet Order:   Diet Order            DIET - DYS 1 Room service appropriate? Yes with Assist; Fluid consistency: Thin  Diet effective now                 EDUCATION NEEDS:   Not appropriate for education at this time  Skin:  Skin Assessment: Reviewed RN Assessment  Last BM:  Unknown  Height:   Ht Readings from Last 1 Encounters:  10/10/20 5\' 2"  (1.575 m)    Weight:   Wt Readings from Last 1 Encounters:  10/11/20  51 kg    Ideal Body Weight:  50 kg  BMI:  Body mass index is 20.56 kg/m.  Estimated Nutritional Needs:   Kcal:  1550-1750  Protein:  65-80 grams  Fluid:  > 1.5 L    Loistine Chance, RD, LDN, [place] Registered Dietitian II Certified Diabetes Care and Education Specialist Please refer to [facility] for RD and/or RD on-call/weekend/after hours pager

## 2020-10-11 NOTE — ED Notes (Signed)
Report given to floor Neila Gear, RN

## 2020-10-11 NOTE — Evaluation (Signed)
Clinical/Bedside Swallow Evaluation Patient Details  Name: Marie Burch MRN: [id number] Date of Birth: 1934-03-17  Today's Date: 10/11/2020 Time: SLP Start Time (ACUTE ONLY): 1128 SLP Stop Time (ACUTE ONLY): 1143 SLP Time Calculation (min) (ACUTE ONLY): 15 min  Past Medical History:  Past Medical History:  Diagnosis Date  . Anxiety   . Arthritis   . Blood transfusion 1960's   During Nephrectomy Procedure  . Cancer [facility]) 2001 and 2002   breast, right  . Cancer of colon ([place]) 08/03/12  . Chronic kidney disease    history of right nephrectomy due to stone '60's  . Complication of anesthesia    "I quit breathing" (patient reported it was due to PCN allergy though '60's)  . Coronary artery disease   . Depression   . Fall 08/28/2020  . GERD (gastroesophageal reflux disease)   . Heart murmur    s/p MV ring annuloplasty '03  . Hyperlipidemia   . Hypertension    dr Daneen Schick  . S/P radiation therapy 11/29/01 - 03/18/02   Right Breast: 5,040 cGy/28 Fractions with Boost for Total Dose  of 6,3000 cGy   Past Surgical History:  Past Surgical History:  Procedure Laterality Date  . ABDOMINAL HYSTERECTOMY    . APPENDECTOMY    . APPENDECTOMY  08/03/2012   Procedure: APPENDECTOMY;  Surgeon: Madilyn Hook, DO;  Location: [place];  Service: General;  Laterality: N/A;  incidental appendectomy  . BREAST LUMPECTOMY    . BREAST SURGERY Right 2001  . catarects Bilateral few yrs ago  . COLON SURGERY  07-2012  . COLONOSCOPY WITH PROPOFOL N/A 08/08/2013   Procedure: COLONOSCOPY WITH PROPOFOL;  Surgeon: Garlan Fair, MD;  Location: [facility];  Service: Endoscopy;  Laterality: N/A;  . CORONARY ARTERY BYPASS GRAFT  2003   CABG X 2 (SVGs to DIAG and OM)/MV Repair '03  . DIAGNOSTIC LAPAROSCOPY    . EYE SURGERY      lump removed rt eye  . kidney removed  1960's  . LAPAROTOMY  08/03/2012   Procedure: LAPAROTOMY for Left  Hemicolectomy   ;  Surgeon: Madilyn Hook, DO;  Location: [place];  Service:  General;  Laterality: N/A;  Mini Laparotomy  . PROCTOSCOPY  08/03/2012   Procedure: PROCTOSCOPY;  Surgeon: Madilyn Hook, DO;  Location: [place];  Service: General;  Laterality: N/A;   HPI:  Pt is an 86 yo female admitted from SNF s/p mechanical fall, sustaining 2 lacs to her R forehead and losing one tooth. CTH revealed an acute on chronic SDH over the L frontal and temporal lobe. PMH includes: HTN, HLD, heart murmur, GERD, fall, depression, CAD, CKD, breast ca s/p XRT, athritis, anxiety   Assessment / Plan / Recommendation Clinical Impression  Pt is confused but redirectable and cooperative with following commands. Unclear what her baseline mentation is like for comparison, but she is appropriate with PO intake, although not necessarily wanting much intake because she wants to "wait until I'm there." She has missing and broken teeth - suspected to be chronic since she was only described as having lost a single tooth this admission. When offered bites of soft solids she says that she "can't chew" and does not accept them. Recommend starting with Dys 1 diet and thin liquids with assistance during meals due to cognitive status. SLP will f/u briefly to better determine potential for solid advancement.   SLP Visit Diagnosis: Dysphagia, unspecified (R13.10)    Aspiration Risk  Mild aspiration risk  Diet Recommendation Dysphagia 1 (Puree);Thin liquid   Liquid Administration via: Cup;Straw Medication Administration: Whole meds with puree Supervision: Staff to assist with self feeding;Full supervision/cueing for compensatory strategies Compensations: Minimize environmental distractions Postural Changes: Seated upright at 90 degrees    Other  Recommendations Oral Care Recommendations: Oral care BID   Follow up Recommendations Skilled Nursing facility      Frequency and Duration min 2x/week  2 weeks       Prognosis Prognosis for Safe Diet Advancement: Good Barriers to Reach Goals: Cognitive  deficits      Swallow Study   General HPI: Pt is an 86 yo female admitted from SNF s/p mechanical fall, sustaining 2 lacs to her R forehead and losing one tooth. CTH revealed an acute on chronic SDH over the L frontal and temporal lobe. PMH includes: HTN, HLD, heart murmur, GERD, fall, depression, CAD, CKD, breast ca s/p XRT, athritis, anxiety Type of Study: Bedside Swallow Evaluation Previous Swallow Assessment: none in chart Diet Prior to this Study: NPO Temperature Spikes Noted: No Respiratory Status: Room air History of Recent Intubation: No Behavior/Cognition: Alert;Cooperative Oral Cavity Assessment: Within Functional Limits Oral Care Completed by SLP: Yes Oral Cavity - Dentition: Poor condition;Missing dentition Vision: Functional for self-feeding Self-Feeding Abilities: Needs assist Patient Positioning: Upright in bed Baseline Vocal Quality: Normal Volitional Swallow: Able to elicit    Oral/Motor/Sensory Function Overall Oral Motor/Sensory Function: Within functional limits   Ice Chips Ice chips: Not tested   Thin Liquid Thin Liquid: Within functional limits Presentation: Straw;Self Fed    Nectar Thick Nectar Thick Liquid: Not tested   Honey Thick Honey Thick Liquid: Not tested   Puree Puree: Impaired Presentation: Spoon   Solid     Solid: Not tested      Osie Bond., M.A. [place] [facility] Pager [phone] Office [phone]  10/11/2020,12:31 PM

## 2020-10-12 DIAGNOSIS — I480 Paroxysmal atrial fibrillation: Secondary | ICD-10-CM | POA: Diagnosis present

## 2020-10-12 DIAGNOSIS — G309 Alzheimer's disease, unspecified: Secondary | ICD-10-CM | POA: Diagnosis present

## 2020-10-12 DIAGNOSIS — I440 Atrioventricular block, first degree: Secondary | ICD-10-CM | POA: Diagnosis present

## 2020-10-12 DIAGNOSIS — S0181XA Laceration without foreign body of other part of head, initial encounter: Secondary | ICD-10-CM | POA: Diagnosis present

## 2020-10-12 DIAGNOSIS — E440 Moderate protein-calorie malnutrition: Secondary | ICD-10-CM | POA: Diagnosis present

## 2020-10-12 DIAGNOSIS — E785 Hyperlipidemia, unspecified: Secondary | ICD-10-CM | POA: Diagnosis present

## 2020-10-12 DIAGNOSIS — K219 Gastro-esophageal reflux disease without esophagitis: Secondary | ICD-10-CM | POA: Diagnosis present

## 2020-10-12 DIAGNOSIS — I2510 Atherosclerotic heart disease of native coronary artery without angina pectoris: Secondary | ICD-10-CM | POA: Diagnosis present

## 2020-10-12 DIAGNOSIS — Z6820 Body mass index (BMI) 20.0-20.9, adult: Secondary | ICD-10-CM | POA: Diagnosis not present

## 2020-10-12 DIAGNOSIS — I4892 Unspecified atrial flutter: Secondary | ICD-10-CM | POA: Diagnosis present

## 2020-10-12 DIAGNOSIS — R402362 Coma scale, best motor response, obeys commands, at arrival to emergency department: Secondary | ICD-10-CM | POA: Diagnosis present

## 2020-10-12 DIAGNOSIS — Z66 Do not resuscitate: Secondary | ICD-10-CM | POA: Diagnosis present

## 2020-10-12 DIAGNOSIS — Y92129 Unspecified place in nursing home as the place of occurrence of the external cause: Secondary | ICD-10-CM | POA: Diagnosis not present

## 2020-10-12 DIAGNOSIS — I129 Hypertensive chronic kidney disease with stage 1 through stage 4 chronic kidney disease, or unspecified chronic kidney disease: Secondary | ICD-10-CM | POA: Diagnosis present

## 2020-10-12 DIAGNOSIS — Z20822 Contact with and (suspected) exposure to covid-19: Secondary | ICD-10-CM | POA: Diagnosis present

## 2020-10-12 DIAGNOSIS — S065X0A Traumatic subdural hemorrhage without loss of consciousness, initial encounter: Secondary | ICD-10-CM | POA: Diagnosis present

## 2020-10-12 DIAGNOSIS — F0281 Dementia in other diseases classified elsewhere with behavioral disturbance: Secondary | ICD-10-CM | POA: Diagnosis present

## 2020-10-12 DIAGNOSIS — S065X9A Traumatic subdural hemorrhage with loss of consciousness of unspecified duration, initial encounter: Secondary | ICD-10-CM | POA: Diagnosis not present

## 2020-10-12 DIAGNOSIS — M5031 Other cervical disc degeneration,  high cervical region: Secondary | ICD-10-CM | POA: Diagnosis present

## 2020-10-12 DIAGNOSIS — W1830XA Fall on same level, unspecified, initial encounter: Secondary | ICD-10-CM | POA: Diagnosis present

## 2020-10-12 DIAGNOSIS — M1990 Unspecified osteoarthritis, unspecified site: Secondary | ICD-10-CM | POA: Diagnosis present

## 2020-10-12 DIAGNOSIS — Z781 Physical restraint status: Secondary | ICD-10-CM | POA: Diagnosis not present

## 2020-10-12 DIAGNOSIS — Z515 Encounter for palliative care: Secondary | ICD-10-CM | POA: Diagnosis not present

## 2020-10-12 DIAGNOSIS — G9341 Metabolic encephalopathy: Secondary | ICD-10-CM | POA: Diagnosis not present

## 2020-10-12 DIAGNOSIS — R402242 Coma scale, best verbal response, confused conversation, at arrival to emergency department: Secondary | ICD-10-CM | POA: Diagnosis present

## 2020-10-12 DIAGNOSIS — R402142 Coma scale, eyes open, spontaneous, at arrival to emergency department: Secondary | ICD-10-CM | POA: Diagnosis present

## 2020-10-12 DIAGNOSIS — N183 Chronic kidney disease, stage 3 unspecified: Secondary | ICD-10-CM | POA: Diagnosis present

## 2020-10-12 MED ORDER — ACETAMINOPHEN 500 MG PO TABS
1000.0000 mg | ORAL_TABLET | Freq: Four times a day (QID) | ORAL | Status: DC
  Administered 2020-10-12 – 2020-10-14 (×6): 1000 mg via ORAL
  Filled 2020-10-12 (×6): qty 2

## 2020-10-12 MED ORDER — MORPHINE SULFATE (CONCENTRATE) 10 MG/0.5ML PO SOLN
10.0000 mg | ORAL | Status: DC | PRN
  Administered 2020-10-12: 10 mg via ORAL
  Filled 2020-10-12: qty 0.5

## 2020-10-12 MED ORDER — LORAZEPAM 2 MG/ML PO CONC: 1.0000 mg | ORAL | Status: DC | PRN

## 2020-10-12 NOTE — TOC Initial Note (Signed)
Transition of Care [facility]) - Initial/Assessment Note    Patient Details  Name: Marie Burch MRN: [id number] Date of Birth: 1934/08/17  Transition of Care [facility]) CM/SW Contact:    Oretha Milch, LCSW Phone Number: 10/12/2020, 11:09 AM  Clinical Narrative: CSW met with patient and daughter. Patient was resting and not able to provide information. Daughter reported they spoke with Palliative and discussing wanting to try residential hospice in [place]. CSW contacted liaison Tammy with [facility] and initiated referral for [place]. CSW will continue to follow for discharge supports.                   Expected Discharge Plan: [place] Barriers to Discharge: Hospice Bed not available   Patient Goals and CMS Choice        Expected Discharge Plan and Services Expected Discharge Plan: [place]                                              Prior Living Arrangements/Services                       Activities of Daily Living Home Assistive Devices/Equipment: Environmental consultant (specify type), Wheelchair ADL Screening (condition at time of admission) Patient's cognitive ability adequate to safely complete daily activities?: No Is the patient deaf or have difficulty hearing?: No Does the patient have difficulty seeing, even when wearing glasses/contacts?: No Does the patient have difficulty concentrating, remembering, or making decisions?: Yes Patient able to express need for assistance with ADLs?: No Does the patient have difficulty dressing or bathing?: Yes Independently performs ADLs?: No Communication: Needs assistance Is this a change from baseline?: Pre-admission baseline Dressing (OT): Needs assistance Is this a change from baseline?: Pre-admission baseline Grooming: Needs assistance Is this a change from baseline?: Pre-admission baseline Feeding: Needs assistance Is this a change from baseline?: Pre-admission  baseline Bathing: Needs assistance Is this a change from baseline?: Pre-admission baseline Toileting: Needs assistance Is this a change from baseline?: Pre-admission baseline In/Out Bed: Needs assistance Is this a change from baseline?: Pre-admission baseline Walks in Home: Needs assistance Is this a change from baseline?: Pre-admission baseline Does the patient have difficulty walking or climbing stairs?: Yes Weakness of Legs: Both Weakness of Arms/Hands: Both  Permission Sought/Granted                  Emotional Assessment              Admission diagnosis:  Subdural hematoma ([place]) [S06.5X9A] Acute subdural hematoma ([place]) [S06.5X9A] Patient Active Problem List   Diagnosis Date Noted  . Malnutrition of moderate degree 10/11/2020  . Falls frequently   . Palliative care by specialist   . Goals of care, counseling/discussion   . DNR (do not resuscitate)   . Subdural hematoma ([place]) 08/28/2020  . Mixed hyperlipidemia 08/28/2020  . Hypokalemia, inadequate intake 08/28/2020  . Hx of CABG 07/12/2020  . Dementia with behavioral disturbance ([place]) 07/12/2020  . Pressure injury of skin 07/12/2020  . UTI (urinary tract infection) 07/12/2020  . Atrial flutter ([place]) 07/11/2020  . Paroxysmal atrial flutter ([place]) 07/10/2020  . S/P mitral valve repair 06/12/2014  . Coronary artery disease involving native coronary artery of native heart without angina pectoris 06/12/2014  . Essential hypertension 06/12/2014  . History of PSVT (paroxysmal supraventricular tachycardia) 06/12/2014  .  Colon cancer ([place]) 08/29/2012   PCP:  Lajean Manes, MD Pharmacy:   [facility], [address] [place] [address] Phone: [phone] Fax: [phone]  [facility], [place] - [address] [place] [address] Phone: [phone] Fax: [place]  [place], [place] [address] Phone: [phone] Fax: [phone]     Social Determinants of Health (SDOH) Interventions    Readmission Risk Interventions No flowsheet data found.

## 2020-10-12 NOTE — Consult Note (Signed)
Palliative Care  Consultation Note Requested by: TRH Reason: Goals of Care  Marie Burch is an 86 yo woman with advanced dementia with multiple recent falls and admitted with expanding subdural hematoma, she is agitated and requiring restraints to maintain medical interventions and for her safety. Minimal PO intake and requires full assistance with all ADLs. She has had 3 admissions and 1 ED visit in the last 6 months. I have reviewed her history, imaging and current diagnostic data and evaluations. She has a DNR order and her daughter Marie Burch is her primary Media planner and HCPOA.   I have a phone call with her daughter planned for 12/4 in AM. I am strongly recommending Hospice Services and a shift towards comfort care. It is unlikely that her condition will improve and her baseline functional status and QOL are very poor. For now I have ordered Roxanol q2 prn for pain and scheduled tylenol. She is also high risk for seizure or rapid neurological decline.  At this time she is not safe for discharge to any other level of care given ongoing need for restraints and inadequate management of her symptoms.   Lane Hacker, DO Palliative Medicine

## 2020-10-12 NOTE — Progress Notes (Signed)
Pt placed on comfort care measures - telemetry leads, SP02 censor, SCDs, and mitts removed. Posey belt left in place as safety precaution to prevent pt from having an additional fall and she has been seen trying to climb out of bed.   Justice Rocher, RN

## 2020-10-12 NOTE — Progress Notes (Addendum)
Manufacturing engineer [facility])  Referral received for residential hospice at [facility] in [place].  Unfortunately, we do not have a bed to offer today.  ACC will reach out to family to provide support and answer questions.  Will update hospital once our bed capacity changes.  Spoke with dtr [name] at length, provided support and answered questions.   Venia Carbon RN, BSN, [facility] Liaison

## 2020-10-12 NOTE — Progress Notes (Signed)
Palliative Care  Follow-up Visit  Marie Burch is resting peacefully this AM after receiving medication for agitation. She has bilateral mitts and a posey belt on and has difficulty waking up or following commands. I spoke with her daughter in detail about her current condition and anticipated EOL trajectory. The daughter agrees that the goals of care for her mother should be comfort measures and allowing for a natural death to occur with anymore aggressive medical interventions or rehospitalization's. I explained the use of medications for both her comfort and safety as well as preserving her dignity without the use of restraints.  Given the extent of her SDH, baseline poor functional status and decline,poor PO intake and difficult to manage agitation I am recommending a hospice facility for her care. Her prognosis based on her current state is likely <2 weeks.  Patient's daughter is in agreement with [facility] referral. I have added Ativan Intensol and Roxanol for her comfort and discussed plan with bedside RN to ensure patient recieves adequate PRN dosing of comfort medications.  Lane Hacker, DO Palliative Medicine  Time: 35 minutes Greater than 50%  of this time was spent counseling and coordinating care related to the above assessment and plan.

## 2020-10-12 NOTE — Progress Notes (Signed)
OT Cancellation Note  Patient Details Name: Marie Burch MRN: [id number] DOB: [birth year]   Cancelled Treatment:    Reason Eval/Treat Not Completed: Other (comment); noted and acknowledge discontinued OT orders, per chart pt transitioning to comfort care. Acute OT to sign off. Please re-consult should pt's needs change.  Lou Cal, OT Acute Rehabilitation Services Pager [phone] Office [place] 10/12/2020, 10:59 AM

## 2020-10-12 NOTE — Progress Notes (Signed)
PROGRESS NOTE  Marie Burch EUM:[id number] DOB: [birth year] DOA: 10/10/2020 PCP: Lajean Manes, MD  Brief History   The patient is a 86 yr old woman who suffers from Alzheimer's Dementia which has worsened in the last 1-2 months after her first SDH in 08/2020.  Her daughter feels that this is due to multiple transitions from facility to facility and room to room. At the time of the first SDH she was on Eliquis. It was discontinued, and she was on aspirin when she fell again on 10/10/2020.   The fall was believed to be a mechanical fall. She sustained a left facial laceration and left periorbital hematoma. The patient was brought to the [facility] ED. CT head was performed in the ED which demonstrated an acute on chronic left subdural hematoma with a maximum thickness of 1.4 cm with 5 mm midline shift. Dr. Christella Noa was call to consult for neurosurgery. He evaluated the patient and determined that no further intervention was required.   In the ED the patient became very agitated and confused. She had to be placed on restraints and she was given Droperidol. Laboratory work up and EKG were unremarkalbe. CT of the C-spine was performed and demonstrated oly multi level degenerative disc disease C2-3 through C6-7. There was no evidence for cervical spine fracture or dislocation.   The patient's daughter wished to involve palliative care in discussion regarding her mom's future. She spoke with Dr. Hilma Favors this am. The patient is now on comfort care and awaiting hospice placement.  Consultants  . Neurosurgery . Palliative care  Procedures  . None  Antibiotics   Anti-infectives (From admission, onward)   None     Subjective  The patient is resting comfortably. No new complaints.  Objective   Vitals:  Vitals:   10/12/20 0458 10/12/20 0743  BP: 130/68 (!) 149/73  Pulse: (!) 52 (!) 57  Resp: 13 20  Temp: 98.4 F (36.9 C) 97.9 F (36.6 C)  SpO2: 99%    I have personally reviewed the  following:   Today's Data  . Vitals, BMP  Imaging  . CT head . Ct C-spine  Scheduled Meds: . acetaminophen  1,000 mg Oral Q6H  . amiodarone  200 mg Oral Daily  . clonazePAM  0.5 mg Oral QHS  . sertraline  100 mg Oral Daily    Principal Problem:   Subdural hematoma (HCC) Active Problems:   S/P mitral valve repair   Coronary artery disease involving native coronary artery of native heart without angina pectoris   Essential hypertension   Paroxysmal atrial flutter (HCC)   Hx of CABG   Dementia with behavioral disturbance ([place])   DNR (do not resuscitate)   Malnutrition of moderate degree   LOS: 0 days   A & P  Acute on chronic left-sided subdural hematoma: Second SDH since 08/2020. It is status post fall for which neurosurgery was consulted and as per neurosurgeon Dr. Christella Noa no further work-up is necessary. The patient is still in a C-collar, although her CT C-spine demonstrated no acute fracture. Will remove it. The patient's daughter has conferred with palliative care. The patient is now comfort care only. She is awaiting placement in residential hospice.  Agitation and encephalopathy with history of dementia was given droperidol in the ER and kept patient on restraints.  This mroning the patient has passed a speech eval. She has been restarted on a diet and her oral medications. The patient is disoriented, but this is not unusual according to  her daughter. Comfort care only.  History of proximal atrial fibrillation presently in sinus rhythm.  Since patient is n.p.o. I have kept patient on scheduled dose of IV metoprolol.  This will be stopped and the patient will be continued on her home doses of oral metoprolol and amiodarone. She is on no anticoagulation given her history of SDH. Comfort care only.  Hypertension uncontrolled we will keep patient on as needed IV labetalol until patient can take her home medication.  Patient is also on scheduled dose of IV metoprolol. Comfort  care only.  History of dementia with behavioral disturbances presently n.p.o. and in restraints.  Pt is more cooperative and closer to her baseline this morning. She has passed a swallow screen, has been given a diet and is restarted on her oral medications. According to her daughter she sounds like she is not far from her baseline which has been worsening lately. The daughter expressed interest in talking with palliative care. They have been consulted. Comfort care only.  Chronic kidney disease stage III creatinine appears to be at baseline. Monitor creatinine, electrolytes, and volume status. Comfort care only.  I have seen and examined this patient myself. I have spent 36 minutes in her evaluation and care. More than 50% of this has been spent in counselling with the patient's daughter.  DVT prophylaxis: SCDs.  Avoiding anticoagulation due to subdural hematoma. Code Status: DNR. Family Communication: Discussed with daughter [name] in detail. Disposition Plan: Residential hospice.  Shweta Aman, DO [facility] Direct contact: see [email or web address]  7PM-7AM contact night coverage as above 10/12/2020, 2:41 PM  LOS: 0 days

## 2020-10-13 MED ORDER — MORPHINE SULFATE (CONCENTRATE) 10 MG/0.5ML PO SOLN
10.0000 mg | ORAL | Status: DC | PRN
  Administered 2020-10-13 – 2020-10-14 (×2): 10 mg via ORAL
  Filled 2020-10-13 (×2): qty 0.5

## 2020-10-13 NOTE — Progress Notes (Signed)
This RN walked in to find pt covered in blood from where she had pulled her IV out. Other PIV still intact. Pt bathed and given fresh gown and linens. Mitts placed back on pt as well as clean posey.   Justice Rocher, RN

## 2020-10-13 NOTE — Progress Notes (Signed)
PROGRESS NOTE  Marie Burch MVH:[id number] DOB: [birth year] DOA: 10/10/2020 PCP: Lajean Manes, MD  Brief History   The patient is a 86 yr old woman who suffers from Alzheimer's Dementia which has worsened in the last 1-2 months after her first SDH in 08/2020.  Her daughter feels that this is due to multiple transitions from facility to facility and room to room. At the time of the first SDH she was on Eliquis. It was discontinued, and she was on aspirin when she fell again on 10/10/2020.   The fall was believed to be a mechanical fall. She sustained a left facial laceration and left periorbital hematoma. The patient was brought to the [facility] ED. CT head was performed in the ED which demonstrated an acute on chronic left subdural hematoma with a maximum thickness of 1.4 cm with 5 mm midline shift. Dr. Christella Noa was call to consult for neurosurgery. He evaluated the patient and determined that no further intervention was required.   In the ED the patient became very agitated and confused. She had to be placed on restraints and she was given Droperidol. Laboratory work up and EKG were unremarkalbe. CT of the C-spine was performed and demonstrated oly multi level degenerative disc disease C2-3 through C6-7. There was no evidence for cervical spine fracture or dislocation.   The patient's daughter wished to involve palliative care in discussion regarding her mom's future. She spoke with Dr. Hilma Favors this am. The patient is now on comfort care and awaiting hospice placement.  Consultants  . Neurosurgery . Palliative care  Procedures  . None  Antibiotics   Anti-infectives (From admission, onward)   None     Subjective  The patient is resting comfortably. No new complaints.  Objective   Vitals:  Vitals:   10/13/20 0500 10/13/20 0734  BP: 111/61 103/64  Pulse: (!) 54   Resp:  16  Temp: 98 F (36.7 C) (!) 97.4 F (36.3 C)  SpO2: 95% 94%   I have personally reviewed the following:    Today's Data  . Vitals  Imaging  . CT head . Ct C-spine  Scheduled Meds: . acetaminophen  1,000 mg Oral Q6H  . amiodarone  200 mg Oral Daily  . clonazePAM  0.5 mg Oral QHS  . sertraline  100 mg Oral Daily    Principal Problem:   Subdural hematoma (HCC) Active Problems:   S/P mitral valve repair   Coronary artery disease involving native coronary artery of native heart without angina pectoris   Essential hypertension   Paroxysmal atrial flutter (HCC)   Hx of CABG   Dementia with behavioral disturbance ([place])   DNR (do not resuscitate)   Malnutrition of moderate degree   LOS: 1 day   A & P  Acute on chronic left-sided subdural hematoma: Second SDH since 08/2020. It is status post fall for which neurosurgery was consulted and as per neurosurgeon Dr. Christella Noa no further work-up is necessary. The patient is still in a C-collar, although her CT C-spine demonstrated no acute fracture. Will remove it. The patient's daughter has conferred with palliative care. The patient is now comfort care only. She is awaiting placement in residential hospice.  Agitation and encephalopathy with history of dementia was given droperidol in the ER and kept patient on restraints.  This mroning the patient has passed a speech eval. She has been restarted on a diet and her oral medications. The patient is disoriented, but this is not unusual according to her daughter.  Comfort care only.  History of proximal atrial fibrillation presently in sinus rhythm.  Since patient is n.p.o. I have kept patient on scheduled dose of IV metoprolol.  This will be stopped and the patient will be continued on her home doses of oral metoprolol and amiodarone. She is on no anticoagulation given her history of SDH. Comfort care only.  Hypertension uncontrolled we will keep patient on as needed IV labetalol until patient can take her home medication.  Patient is also on scheduled dose of IV metoprolol. Comfort care  only.  History of dementia with behavioral disturbances presently n.p.o. and in restraints.  Pt is more cooperative and closer to her baseline this morning. She has passed a swallow screen, has been given a diet and is restarted on her oral medications. According to her daughter she sounds like she is not far from her baseline which has been worsening lately. The daughter expressed interest in talking with palliative care. They have been consulted. Comfort care only.  Chronic kidney disease stage III creatinine appears to be at baseline. Monitor creatinine, electrolytes, and volume status. Comfort care only.  I have seen and examined this patient myself. I have spent 36 minutes in her evaluation and care. More than 50% of this has been spent in counselling with the patient's daughter.  DVT prophylaxis: SCDs.  Avoiding anticoagulation due to subdural hematoma. Code Status: DNR. Family Communication: Discussed with daughter [name] in detail. Disposition Plan: Residential hospice.  Raymone Pembroke, DO [facility] Direct contact: see [email or web address]  7PM-7AM contact night coverage as above 10/13/2020, 2:26 PM  LOS: 0 days

## 2020-10-14 MED ORDER — LORAZEPAM 1 MG PO TABS: 1.0000 mg | ORAL_TABLET | ORAL | Status: DC | PRN

## 2020-10-14 MED ORDER — HYDROMORPHONE HCL 1 MG/ML IJ SOLN: 0.5000 mg | INTRAMUSCULAR | Status: DC | PRN

## 2020-10-14 MED ORDER — HALOPERIDOL 1 MG PO TABS: 0.5000 mg | ORAL_TABLET | ORAL | Status: DC | PRN

## 2020-10-14 MED ORDER — HALOPERIDOL LACTATE 5 MG/ML IJ SOLN: 0.5000 mg | INTRAMUSCULAR | Status: DC | PRN

## 2020-10-14 MED ORDER — LORAZEPAM 2 MG/ML PO CONC: 1.0000 mg | ORAL | Status: DC | PRN

## 2020-10-14 MED ORDER — HALOPERIDOL LACTATE 2 MG/ML PO CONC
0.5000 mg | ORAL | Status: DC | PRN
  Filled 2020-10-14: qty 0.3

## 2020-10-14 MED ORDER — LORAZEPAM 2 MG/ML IJ SOLN
1.0000 mg | INTRAMUSCULAR | Status: DC | PRN
  Filled 2020-10-14: qty 1

## 2020-10-14 NOTE — TOC Transition Note (Signed)
Transition of Care [facility]) - CM/SW Discharge Note   Patient Details  Name: Marie Burch MRN: [id number] Date of Birth: 04/17/1934  [facility] CM/SW Contact:  Vinie Sill, [place] Phone Number: 10/14/2020, 4:26 PM   Clinical Narrative:     Patient will DC to:[facility] in [place] Date: 10/14/2020 Family Notified: left voice message w/her daughter [name] Transport By: Corey Harold   Per MD patient is ready for discharge. RN, patient, and facility notified of DC. Discharge Summary sent to facility. RN given number for [phone]. Ambulance transport requested for patient.   Clinical Social Worker signing off.  Thurmond Butts, MSW, LCSWA Clinical Social Worker      Barriers to Discharge: Hospice Bed not available   Patient Goals and CMS Choice        Discharge Placement                       Discharge Plan and Services                                     Social Determinants of Health (SDOH) Interventions     Readmission Risk Interventions No flowsheet data found.

## 2020-10-14 NOTE — Plan of Care (Signed)
Problem: Education: Goal: Knowledge of General Education information will improve Description: Including pain rating scale, medication(s)/side effects and non-pharmacologic comfort measures 10/14/2020 1553 by Emmaline Life, RN Outcome: Adequate for Discharge 10/14/2020 1553 by Emmaline Life, RN Outcome: Adequate for Discharge 10/14/2020 1547 by Emmaline Life, RN Outcome: Adequate for Discharge   Problem: Health Behavior/Discharge Planning: Goal: Ability to manage health-related needs will improve 10/14/2020 1553 by Emmaline Life, RN Outcome: Adequate for Discharge 10/14/2020 1553 by Emmaline Life, RN Outcome: Adequate for Discharge 10/14/2020 1547 by Emmaline Life, RN Outcome: Adequate for Discharge   Problem: Clinical Measurements: Goal: Ability to maintain clinical measurements within normal limits will improve 10/14/2020 1553 by Emmaline Life, RN Outcome: Adequate for Discharge 10/14/2020 1553 by Emmaline Life, RN Outcome: Adequate for Discharge 10/14/2020 1547 by Emmaline Life, RN Outcome: Adequate for Discharge Goal: Will remain free from infection 10/14/2020 1553 by Emmaline Life, RN Outcome: Adequate for Discharge 10/14/2020 1553 by Emmaline Life, RN Outcome: Adequate for Discharge 10/14/2020 1547 by Emmaline Life, RN Outcome: Adequate for Discharge Goal: Diagnostic test results will improve 10/14/2020 1553 by Emmaline Life, RN Outcome: Adequate for Discharge 10/14/2020 1553 by Emmaline Life, RN Outcome: Adequate for Discharge 10/14/2020 1547 by Emmaline Life, RN Outcome: Adequate for Discharge Goal: Respiratory complications will improve 10/14/2020 1553 by Emmaline Life, RN Outcome: Adequate for Discharge 10/14/2020 1553 by Emmaline Life, RN Outcome: Adequate for Discharge 10/14/2020 1547 by Emmaline Life, RN Outcome: Adequate for Discharge Goal: Cardiovascular complication will be avoided 10/14/2020 1553  by Emmaline Life, RN Outcome: Adequate for Discharge 10/14/2020 1553 by Emmaline Life, RN Outcome: Adequate for Discharge 10/14/2020 1547 by Emmaline Life, RN Outcome: Adequate for Discharge   Problem: Activity: Goal: Risk for activity intolerance will decrease 10/14/2020 1553 by Emmaline Life, RN Outcome: Adequate for Discharge 10/14/2020 1553 by Emmaline Life, RN Outcome: Adequate for Discharge 10/14/2020 1547 by Emmaline Life, RN Outcome: Adequate for Discharge   Problem: Nutrition: Goal: Adequate nutrition will be maintained 10/14/2020 1553 by Emmaline Life, RN Outcome: Adequate for Discharge 10/14/2020 1553 by Emmaline Life, RN Outcome: Adequate for Discharge 10/14/2020 1547 by Emmaline Life, RN Outcome: Adequate for Discharge   Problem: Coping: Goal: Level of anxiety will decrease 10/14/2020 1553 by Emmaline Life, RN Outcome: Adequate for Discharge 10/14/2020 1553 by Emmaline Life, RN Outcome: Adequate for Discharge 10/14/2020 1547 by Emmaline Life, RN Outcome: Adequate for Discharge   Problem: Elimination: Goal: Will not experience complications related to bowel motility 10/14/2020 1553 by Emmaline Life, RN Outcome: Adequate for Discharge 10/14/2020 1553 by Emmaline Life, RN Outcome: Adequate for Discharge 10/14/2020 1547 by Emmaline Life, RN Outcome: Adequate for Discharge Goal: Will not experience complications related to urinary retention 10/14/2020 1553 by Emmaline Life, RN Outcome: Adequate for Discharge 10/14/2020 1553 by Emmaline Life, RN Outcome: Adequate for Discharge 10/14/2020 1547 by Emmaline Life, RN Outcome: Adequate for Discharge   Problem: Pain Managment: Goal: General experience of comfort will improve 10/14/2020 1553 by Emmaline Life, RN Outcome: Adequate for Discharge 10/14/2020 1553 by Emmaline Life, RN Outcome: Adequate for Discharge 10/14/2020 1547 by Emmaline Life,  RN Outcome: Adequate for Discharge   Problem: Safety: Goal: Ability to remain free from injury will improve 10/14/2020 1553 by Emmaline Life, RN Outcome: Adequate for Discharge 10/14/2020 1553 by Emmaline Life,  RN Outcome: Adequate for Discharge 10/14/2020 1547 by Emmaline Life, RN Outcome: Adequate for Discharge   Problem: Skin Integrity: Goal: Risk for impaired skin integrity will decrease 10/14/2020 1553 by Emmaline Life, RN Outcome: Adequate for Discharge 10/14/2020 1553 by Emmaline Life, RN Outcome: Adequate for Discharge 10/14/2020 1547 by Emmaline Life, RN Outcome: Adequate for Discharge   Problem: Education: Goal: Knowledge of secondary prevention will improve 10/14/2020 1553 by Emmaline Life, RN Outcome: Adequate for Discharge 10/14/2020 1553 by Emmaline Life, RN Outcome: Adequate for Discharge 10/14/2020 1547 by Emmaline Life, RN Outcome: Adequate for Discharge Goal: Knowledge of patient specific risk factors addressed and post discharge goals established will improve 10/14/2020 1553 by Emmaline Life, RN Outcome: Adequate for Discharge 10/14/2020 1553 by Emmaline Life, RN Outcome: Adequate for Discharge 10/14/2020 1547 by Emmaline Life, RN Outcome: Adequate for Discharge   Problem: Health Behavior/Discharge Planning: Goal: Ability to manage health-related needs will improve 10/14/2020 1553 by Emmaline Life, RN Outcome: Adequate for Discharge 10/14/2020 1553 by Emmaline Life, RN Outcome: Adequate for Discharge 10/14/2020 1547 by Emmaline Life, RN Outcome: Adequate for Discharge

## 2020-10-14 NOTE — Discharge Summary (Signed)
Physician Discharge Summary  Marie Burch ZOX:[id number] DOB: [birth year] DOA: 10/10/2020  PCP: Lajean Manes, MD  Admit date: 10/10/2020 Discharge date: 10/14/2020  Admitted From: Home Disposition: Inpatient hospice  Recommendations for Outpatient Follow-up:  1. As per hospice provider  Home Health: Not applicable Equipment/Devices: Not applicable  Discharge Condition: Critical CODE STATUS: Comfort care Diet recommendation: Comfort feeding  Discharge summary:  86 year old with history of Alzheimer's dementia with recently worsening symptoms.  History of subdural hematoma on Eliquis.  She had another fall at home, brought to ER and found to have acute on chronic left subdural hematoma with 5 mm midline shift.  In the emergency room patient became very agitated and confused.  Placed on restraints.  Other skeletal survey was negative. 12/5, given advanced dementia and now with agitation and subdural hematoma comfort care was started and hospice referral initiated.   Acute on chronic left-sided subdural hematoma, recurrent and untreatable Agitation and metabolic encephalopathy in a patient with underlying advanced dementia History of paroxysmal A. Fib Hypertension Chronic kidney disease stage III End-of-life care  Plan: Patient at end-of-life,  She is more agitated and requiring restraints, will start patient on various opiates, benzodiazepines, haloperidol and symptom control medication and discontinue physical restraint.  Comfort care.  Transfer to hospice when bed is available. RN can pronounce death if happens in the hospital. Unrestricted visitor access. Comfort diet. No benefit with continuing amiodarone and other oral rate control medication, discontinued. Patient is not able to take oral medications safely, will discontinue all chronic health maintenance treatments. She will be medicated with comfort medication including benzodiazepines and opiates before  transport.   Discharge Diagnoses:  Principal Problem:   Subdural hematoma (HCC) Active Problems:   S/P mitral valve repair   Coronary artery disease involving native coronary artery of native heart without angina pectoris   Essential hypertension   Paroxysmal atrial flutter (HCC)   Hx of CABG   Dementia with behavioral disturbance (HCC)   End of life care   DNR (do not resuscitate)   Malnutrition of moderate degree    Discharge Instructions  Discharge Instructions    Diet general   Complete by: As directed    Increase activity slowly   Complete by: As directed    No wound care   Complete by: As directed      Allergies as of 10/14/2020      Reactions   Penicillins Anaphylaxis   Levaquin [levofloxacin] Other (See Comments)   PT TOLD TO LEAVE ANTIBIOTICS ALONE AFTER NEPHRECTOMY PT HAD REACTION TO ANTIBIOTICS   Niacin And Related Other (See Comments)   unknown   Norvasc [amlodipine Besylate] Other (See Comments)   Unknown, PT NOT SURE   Novocain [procaine Hcl] Other (See Comments)   Numbness would not leave    Pravachol [pravastatin Sodium] Other (See Comments)   Unknown, ? Questionable muscle aches   Statins Other (See Comments)   Unknown, questionable muscle aches   Sulfa Antibiotics    Zetia [ezetimibe] Other (See Comments)   Unknown, pt not sure      Medication List    STOP taking these medications   alendronate 70 MG tablet Commonly known as: FOSAMAX   amiodarone 200 MG tablet Commonly known as: PACERONE   atenolol 25 MG tablet Commonly known as: TENORMIN   Calcium+D3 600-800 MG-UNIT Tabs Generic drug: Calcium Carb-Cholecalciferol   clonazePAM 0.5 MG tablet Commonly known as: KLONOPIN   Fish Oil 1000 MG Caps   hydrochlorothiazide 25 MG tablet  Commonly known as: HYDRODIURIL   Nitrostat 0.4 MG SL tablet Generic drug: nitroGLYCERIN   polyethylene glycol 17 g packet Commonly known as: MIRALAX / GLYCOLAX   potassium chloride SA 20 MEQ  tablet Commonly known as: KLOR-CON   sertraline 100 MG tablet Commonly known as: ZOLOFT       Allergies  Allergen Reactions  . Penicillins Anaphylaxis  . Levaquin [Levofloxacin] Other (See Comments)    PT TOLD TO LEAVE ANTIBIOTICS ALONE AFTER NEPHRECTOMY PT HAD REACTION TO ANTIBIOTICS  . Niacin And Related Other (See Comments)    unknown  . Norvasc [Amlodipine Besylate] Other (See Comments)    Unknown, PT NOT SURE  . Novocain [Procaine Hcl] Other (See Comments)    Numbness would not leave   . Pravachol [Pravastatin Sodium] Other (See Comments)    Unknown, ? Questionable muscle aches  . Statins Other (See Comments)    Unknown, questionable muscle aches  . Sulfa Antibiotics   . Zetia [Ezetimibe] Other (See Comments)    Unknown, pt not sure    Consultations:  Neurosurgery  Palliative and hospice   Procedures/Studies: CT HEAD WO CONTRAST  Result Date: 10/10/2020 CLINICAL DATA:  Status post fall. EXAM: CT HEAD WITHOUT CONTRAST CT MAXILLOFACIAL WITHOUT CONTRAST CT CERVICAL SPINE WITHOUT CONTRAST TECHNIQUE: Multidetector CT imaging of the head, cervical spine, and maxillofacial structures were performed using the standard protocol without intravenous contrast. Multiplanar CT image reconstructions of the cervical spine and maxillofacial structures were also generated. COMPARISON:  08/28/2020 FINDINGS: CT HEAD FINDINGS Brain: Acute on chronic left subdural hematoma is identified overlying the left cerebral hemisphere. This measures approximately 1.4 cm in maximum thickness compared with 1.1 cm on 08/28/2020 acute hemorrhage is noted overlying the posterior parietal and temporal lobes, image 18/3. There is also a focus of acute hemorrhage overlying the medial left frontal lobe. Mass effect upon the underlying left cerebral hemisphere is noted and there is left to right midline shift measuring 5 mm. Previously this measured 3 mm. There is mild diffuse low-attenuation within the  subcortical and periventricular white matter compatible with chronic microvascular disease. Prominence of the sulci and ventricles identified compatible with brain atrophy. Vascular: No hyperdense vessel or unexpected calcification. Skull: No skull fracture Other: The paranasal sinuses and mastoid air cells are clear. CT MAXILLOFACIAL FINDINGS Osseous: No fracture or mandibular dislocation. No destructive process. Orbits: Negative. No traumatic or inflammatory finding. Sinuses: Clear. Soft tissues: Left lateral periorbital hematoma is identified with signs of left facial contusion and laceration. CT CERVICAL SPINE FINDINGS Alignment: Normal alignment of the cervical spine. Skull base and vertebrae: The vertebral body heights are well preserved. The facet joints are all well aligned. No fracture or dislocation identified. Soft tissues and spinal canal: No prevertebral fluid or swelling. No visible canal hematoma. Disc levels: Multi level degenerative disc disease is identified extending from C2-3 through C6-7. Upper chest: Negative. Other: None IMPRESSION: 1. Acute on chronic left subdural hematoma is identified overlying the left cerebral hemisphere. This measures approximately 1.4 cm in maximum thickness compared with 1.1 cm on 08/28/2020. Mass effect upon the underlying left cerebral hemisphere is noted and there is left to right midline shift measuring 5 mm. 2. Chronic small vessel ischemic change and brain atrophy. 3. No evidence for facial bone fracture. 4. Left lateral periorbital hematoma with signs of left facial contusion and laceration. 5. No evidence for cervical spine fracture or dislocation. 6. Cervical degenerative disc disease. Electronically Signed   By: Queen Slough.D.  On: 10/10/2020 19:35   CT CERVICAL SPINE WO CONTRAST  Result Date: 10/10/2020 CLINICAL DATA:  Status post fall. EXAM: CT HEAD WITHOUT CONTRAST CT MAXILLOFACIAL WITHOUT CONTRAST CT CERVICAL SPINE WITHOUT CONTRAST TECHNIQUE:  Multidetector CT imaging of the head, cervical spine, and maxillofacial structures were performed using the standard protocol without intravenous contrast. Multiplanar CT image reconstructions of the cervical spine and maxillofacial structures were also generated. COMPARISON:  08/28/2020 FINDINGS: CT HEAD FINDINGS Brain: Acute on chronic left subdural hematoma is identified overlying the left cerebral hemisphere. This measures approximately 1.4 cm in maximum thickness compared with 1.1 cm on 08/28/2020 acute hemorrhage is noted overlying the posterior parietal and temporal lobes, image 18/3. There is also a focus of acute hemorrhage overlying the medial left frontal lobe. Mass effect upon the underlying left cerebral hemisphere is noted and there is left to right midline shift measuring 5 mm. Previously this measured 3 mm. There is mild diffuse low-attenuation within the subcortical and periventricular white matter compatible with chronic microvascular disease. Prominence of the sulci and ventricles identified compatible with brain atrophy. Vascular: No hyperdense vessel or unexpected calcification. Skull: No skull fracture Other: The paranasal sinuses and mastoid air cells are clear. CT MAXILLOFACIAL FINDINGS Osseous: No fracture or mandibular dislocation. No destructive process. Orbits: Negative. No traumatic or inflammatory finding. Sinuses: Clear. Soft tissues: Left lateral periorbital hematoma is identified with signs of left facial contusion and laceration. CT CERVICAL SPINE FINDINGS Alignment: Normal alignment of the cervical spine. Skull base and vertebrae: The vertebral body heights are well preserved. The facet joints are all well aligned. No fracture or dislocation identified. Soft tissues and spinal canal: No prevertebral fluid or swelling. No visible canal hematoma. Disc levels: Multi level degenerative disc disease is identified extending from C2-3 through C6-7. Upper chest: Negative. Other: None  IMPRESSION: 1. Acute on chronic left subdural hematoma is identified overlying the left cerebral hemisphere. This measures approximately 1.4 cm in maximum thickness compared with 1.1 cm on 08/28/2020. Mass effect upon the underlying left cerebral hemisphere is noted and there is left to right midline shift measuring 5 mm. 2. Chronic small vessel ischemic change and brain atrophy. 3. No evidence for facial bone fracture. 4. Left lateral periorbital hematoma with signs of left facial contusion and laceration. 5. No evidence for cervical spine fracture or dislocation. 6. Cervical degenerative disc disease. Electronically Signed   By: Kerby Moors M.D.   On: 10/10/2020 19:35   CT MAXILLOFACIAL WO CONTRAST  Result Date: 10/10/2020 CLINICAL DATA:  Status post fall. EXAM: CT HEAD WITHOUT CONTRAST CT MAXILLOFACIAL WITHOUT CONTRAST CT CERVICAL SPINE WITHOUT CONTRAST TECHNIQUE: Multidetector CT imaging of the head, cervical spine, and maxillofacial structures were performed using the standard protocol without intravenous contrast. Multiplanar CT image reconstructions of the cervical spine and maxillofacial structures were also generated. COMPARISON:  08/28/2020 FINDINGS: CT HEAD FINDINGS Brain: Acute on chronic left subdural hematoma is identified overlying the left cerebral hemisphere. This measures approximately 1.4 cm in maximum thickness compared with 1.1 cm on 08/28/2020 acute hemorrhage is noted overlying the posterior parietal and temporal lobes, image 18/3. There is also a focus of acute hemorrhage overlying the medial left frontal lobe. Mass effect upon the underlying left cerebral hemisphere is noted and there is left to right midline shift measuring 5 mm. Previously this measured 3 mm. There is mild diffuse low-attenuation within the subcortical and periventricular white matter compatible with chronic microvascular disease. Prominence of the sulci and ventricles identified compatible with brain atrophy.  Vascular: No hyperdense vessel or unexpected calcification. Skull: No skull fracture Other: The paranasal sinuses and mastoid air cells are clear. CT MAXILLOFACIAL FINDINGS Osseous: No fracture or mandibular dislocation. No destructive process. Orbits: Negative. No traumatic or inflammatory finding. Sinuses: Clear. Soft tissues: Left lateral periorbital hematoma is identified with signs of left facial contusion and laceration. CT CERVICAL SPINE FINDINGS Alignment: Normal alignment of the cervical spine. Skull base and vertebrae: The vertebral body heights are well preserved. The facet joints are all well aligned. No fracture or dislocation identified. Soft tissues and spinal canal: No prevertebral fluid or swelling. No visible canal hematoma. Disc levels: Multi level degenerative disc disease is identified extending from C2-3 through C6-7. Upper chest: Negative. Other: None IMPRESSION: 1. Acute on chronic left subdural hematoma is identified overlying the left cerebral hemisphere. This measures approximately 1.4 cm in maximum thickness compared with 1.1 cm on 08/28/2020. Mass effect upon the underlying left cerebral hemisphere is noted and there is left to right midline shift measuring 5 mm. 2. Chronic small vessel ischemic change and brain atrophy. 3. No evidence for facial bone fracture. 4. Left lateral periorbital hematoma with signs of left facial contusion and laceration. 5. No evidence for cervical spine fracture or dislocation. 6. Cervical degenerative disc disease. Electronically Signed   By: Kerby Moors M.D.   On: 10/10/2020 19:35    (Echo, Carotid, EGD, Colonoscopy, ERCP)    Subjective:  Patient was seen and examined.  She was sleepy.  Intermittently agitated.  Uncomfortable at times.    Discharge Exam: Vitals:   10/14/20 0804 10/14/20 1126  BP: 108/63 (!) 130/59  Pulse: 70 76  Resp: 16 17  Temp: 97.6 F (36.4 C) 97.8 F (36.6 C)  SpO2: 97% 100%   Vitals:   10/13/20 2000 10/14/20  0400 10/14/20 0804 10/14/20 1126  BP: (!) 104/59 (!) 100/59 108/63 (!) 130/59  Pulse: 66 67 70 76  Resp: 20 20 16 17   Temp: 98.4 F (36.9 C) 98 F (36.7 C) 97.6 F (36.4 C) 97.8 F (36.6 C)  TempSrc: Oral Oral Oral Axillary  SpO2: 99%  97% 100%  Weight:      Height:        General exam: Sick looking lady, occasionally irritated and agitation on stimulation. Resting after medications. On room air. Respiratory system: Clear to auscultation. Respiratory effort normal. Cardiovascular system: S1 & S2 heard, RRR. Agitated, restless, disoriented.    The results of significant diagnostics from this hospitalization (including imaging, microbiology, ancillary and laboratory) are listed below for reference.     Microbiology: Recent Results (from the past 240 hour(s))  Resp Panel by RT-PCR (Flu A&B, Covid) Nasopharyngeal Swab     Status: None   Collection Time: 10/11/20 12:44 AM   Specimen: Nasopharyngeal Swab; Nasopharyngeal(NP) swabs in vial transport medium  Result Value Ref Range Status   SARS Coronavirus 2 by RT PCR NEGATIVE NEGATIVE Final    Comment: (NOTE) SARS-CoV-2 target nucleic acids are NOT DETECTED.  The SARS-CoV-2 RNA is generally detectable in upper respiratory specimens during the acute phase of infection. The lowest concentration of SARS-CoV-2 viral copies this assay can detect is 138 copies/mL. A negative result does not preclude SARS-Cov-2 infection and should not be used as the sole basis for treatment or other patient management decisions. A negative result may occur with  improper specimen collection/handling, submission of specimen other than nasopharyngeal swab, presence of viral mutation(s) within the areas targeted by this assay, and inadequate number of viral  copies(<138 copies/mL). A negative result must be combined with clinical observations, patient history, and epidemiological information. The expected result is Negative.  Fact Sheet for Patients:   EntrepreneurPulse.com.au  Fact Sheet for Healthcare Providers:  IncredibleEmployment.be  This test is no t yet approved or cleared by the [place] FDA and  has been authorized for detection and/or diagnosis of SARS-CoV-2 by FDA under an Emergency Use Authorization (EUA). This EUA will remain  in effect (meaning this test can be used) for the duration of the COVID-19 declaration under Section 564(b)(1) of the Act, 21 U.S.C.section 360bbb-3(b)(1), unless the authorization is terminated  or revoked sooner.       Influenza A by PCR NEGATIVE NEGATIVE Final   Influenza B by PCR NEGATIVE NEGATIVE Final    Comment: (NOTE) The Xpert Xpress SARS-CoV-2/FLU/RSV plus assay is intended as an aid in the diagnosis of influenza from Nasopharyngeal swab specimens and should not be used as a sole basis for treatment. Nasal washings and aspirates are unacceptable for Xpert Xpress SARS-CoV-2/FLU/RSV testing.  Fact Sheet for Patients: EntrepreneurPulse.com.au  Fact Sheet for Healthcare Providers: IncredibleEmployment.be  This test is not yet approved or cleared by the [place] FDA and has been authorized for detection and/or diagnosis of SARS-CoV-2 by FDA under an Emergency Use Authorization (EUA). This EUA will remain in effect (meaning this test can be used) for the duration of the COVID-19 declaration under Section 564(b)(1) of the Act, 21 U.S.C. section 360bbb-3(b)(1), unless the authorization is terminated or revoked.  Performed at [facility], [place] [address], [place], [place] [address]   MRSA PCR Screening     Status: None   Collection Time: 10/11/20  6:02 AM   Specimen: Nasal Mucosa; Nasopharyngeal  Result Value Ref Range Status   MRSA by PCR NEGATIVE NEGATIVE Final    Comment:        The GeneXpert MRSA Assay (FDA approved for NASAL specimens only), is one component of a comprehensive MRSA  colonization surveillance program. It is not intended to diagnose MRSA infection nor to guide or monitor treatment for MRSA infections. Performed at [facility], [place] [address], [place],  [address]      Labs: BNP (last 3 results) No results for input(s): BNP in the last 8760 hours. Basic Metabolic Panel: Recent Labs  Lab 10/10/20 1621 10/11/20 0339 10/11/20 1409  NA 142 143 141  K 3.8 3.1* 3.5  CL 101 104 104  CO2 29 25 26   GLUCOSE 121* 172* 109*  BUN 14 9 6*  CREATININE 1.33* 1.01* 0.88  CALCIUM 9.3 9.0 9.0  MG  --  1.7  --    Liver Function Tests: No results for input(s): AST, ALT, ALKPHOS, BILITOT, PROT, ALBUMIN in the last 168 hours. No results for input(s): LIPASE, AMYLASE in the last 168 hours. No results for input(s): AMMONIA in the last 168 hours. CBC: Recent Labs  Lab 10/10/20 1621 10/11/20 0339  WBC 7.5 8.3  HGB 14.8 14.2  HCT 46.9* 45.3  MCV 89.7 90.2  PLT 183 147*   Cardiac Enzymes: No results for input(s): CKTOTAL, CKMB, CKMBINDEX, TROPONINI in the last 168 hours. BNP: Invalid input(s): POCBNP CBG: Recent Labs  Lab 10/11/20 0607 10/11/20 1243  GLUCAP 126* 110*   D-Dimer No results for input(s): DDIMER in the last 72 hours. Hgb A1c No results for input(s): HGBA1C in the last 72 hours. Lipid Profile No results for input(s): CHOL, HDL, LDLCALC, TRIG, CHOLHDL, LDLDIRECT in the last  72 hours. Thyroid function studies No results for input(s): TSH, T4TOTAL, T3FREE, THYROIDAB in the last 72 hours.  Invalid input(s): FREET3 Anemia work up No results for input(s): VITAMINB12, FOLATE, FERRITIN, TIBC, IRON, RETICCTPCT in the last 72 hours. Urinalysis    Component Value Date/Time   COLORURINE STRAW (A) 08/28/2020 0300   APPEARANCEUR CLEAR 08/28/2020 0300   LABSPEC 1.005 08/28/2020 0300   PHURINE 5.0 08/28/2020 0300   GLUCOSEU NEGATIVE 08/28/2020 0300   HGBUR NEGATIVE 08/28/2020 0300   BILIRUBINUR NEGATIVE 08/28/2020 0300    KETONESUR NEGATIVE 08/28/2020 0300   PROTEINUR NEGATIVE 08/28/2020 0300   NITRITE NEGATIVE 08/28/2020 0300   LEUKOCYTESUR NEGATIVE 08/28/2020 0300   Sepsis Labs Invalid input(s): PROCALCITONIN,  WBC,  LACTICIDVEN Microbiology Recent Results (from the past 240 hour(s))  Resp Panel by RT-PCR (Flu A&B, Covid) Nasopharyngeal Swab     Status: None   Collection Time: 10/11/20 12:44 AM   Specimen: Nasopharyngeal Swab; Nasopharyngeal(NP) swabs in vial transport medium  Result Value Ref Range Status   SARS Coronavirus 2 by RT PCR NEGATIVE NEGATIVE Final    Comment: (NOTE) SARS-CoV-2 target nucleic acids are NOT DETECTED.  The SARS-CoV-2 RNA is generally detectable in upper respiratory specimens during the acute phase of infection. The lowest concentration of SARS-CoV-2 viral copies this assay can detect is 138 copies/mL. A negative result does not preclude SARS-Cov-2 infection and should not be used as the sole basis for treatment or other patient management decisions. A negative result may occur with  improper specimen collection/handling, submission of specimen other than nasopharyngeal swab, presence of viral mutation(s) within the areas targeted by this assay, and inadequate number of viral copies(<138 copies/mL). A negative result must be combined with clinical observations, patient history, and epidemiological information. The expected result is Negative.  Fact Sheet for Patients:  EntrepreneurPulse.com.au  Fact Sheet for Healthcare Providers:  IncredibleEmployment.be  This test is no t yet approved or cleared by the [place] FDA and  has been authorized for detection and/or diagnosis of SARS-CoV-2 by FDA under an Emergency Use Authorization (EUA). This EUA will remain  in effect (meaning this test can be used) for the duration of the COVID-19 declaration under Section 564(b)(1) of the Act, 21 U.S.C.section 360bbb-3(b)(1), unless the  authorization is terminated  or revoked sooner.       Influenza A by PCR NEGATIVE NEGATIVE Final   Influenza B by PCR NEGATIVE NEGATIVE Final    Comment: (NOTE) The Xpert Xpress SARS-CoV-2/FLU/RSV plus assay is intended as an aid in the diagnosis of influenza from Nasopharyngeal swab specimens and should not be used as a sole basis for treatment. Nasal washings and aspirates are unacceptable for Xpert Xpress SARS-CoV-2/FLU/RSV testing.  Fact Sheet for Patients: EntrepreneurPulse.com.au  Fact Sheet for Healthcare Providers: IncredibleEmployment.be  This test is not yet approved or cleared by the [place] FDA and has been authorized for detection and/or diagnosis of SARS-CoV-2 by FDA under an Emergency Use Authorization (EUA). This EUA will remain in effect (meaning this test can be used) for the duration of the COVID-19 declaration under Section 564(b)(1) of the Act, 21 U.S.C. section 360bbb-3(b)(1), unless the authorization is terminated or revoked.  Performed at [facility], [place] [address], [place], [place] [address]   MRSA PCR Screening     Status: None   Collection Time: 10/11/20  6:02 AM   Specimen: Nasal Mucosa; Nasopharyngeal  Result Value Ref Range Status   MRSA by PCR NEGATIVE NEGATIVE Final  Comment:        The GeneXpert MRSA Assay (FDA approved for NASAL specimens only), is one component of a comprehensive MRSA colonization surveillance program. It is not intended to diagnose MRSA infection nor to guide or monitor treatment for MRSA infections. Performed at [facility], [place] [address] [place], [place] [address]      Time coordinating discharge:  32 minutes  SIGNED:   Barb Merino, MD  [facility] 10/14/2020, 2:48 PM

## 2020-10-14 NOTE — Progress Notes (Addendum)
Manufacturing engineer [facility]) Liaison Note  Pt chart has been reviewed by [facility] MD and deemed appropriate for residential hospice.  A bed has been offered for transfer today and accepted by pt's daughter [name].  [facility] liaison will update TOC when signed consents have been completed.  Report can be called to [phone].  Once consents have been completed please arrange transportation to [facility] in [place].  Please ensure that the [facility] DNR form transports with the patient.  Please call [phone] with any questions or concerns.  Thank you for the opportunity to participate in this pt's care.  Domenic Moras, BSN, RN [facility] Liaison [phone] (24h on call)

## 2020-10-14 NOTE — TOC Progression Note (Signed)
Transition of Care [facility]) - Progression Note    Patient Details  Name: Marie Burch MRN: [id number] Date of Birth: 11/03/1934  Transition of Care [facility]) CM/SW [place], [place] Phone Number: 10/14/2020, 10:18 AM  Clinical Narrative:     CSW called [facility]  to follow up on availability- CSW left voice message for [place] waiting on response.  Thurmond Butts, MSW, [place] Clinical Social Worker   Expected Discharge Plan: [place] Barriers to Discharge: Hospice Bed not available  Expected Discharge Plan and Services Expected Discharge Plan: [place]                                               Social Determinants of Health (SDOH) Interventions    Readmission Risk Interventions No flowsheet data found.

## 2020-10-14 NOTE — Progress Notes (Signed)
Manufacturing engineer [facility]) Hospital Liaison note.   Received request from [place] for family interest in [place]. Chart and pt information under review by [facility] physician.  Hospice eligibility pending at this time.  Hospice Home is unable to offer a room today. Hospital Liaison will follow up tomorrow or sooner if a room becomes available. Please do not hesitate to call with questions.    Thank you for the opportunity to participate in this patient's care.  Chrislyn Edison Pace, BSN, RN [place] (listed on [place] under Hospice/[facility])    [phone]

## 2020-10-14 NOTE — Plan of Care (Signed)
Problem: Education: Goal: Knowledge of General Education information will improve Description: Including pain rating scale, medication(s)/side effects and non-pharmacologic comfort measures 10/14/2020 1553 by Emmaline Life, RN Outcome: Adequate for Discharge 10/14/2020 1547 by Emmaline Life, RN Outcome: Adequate for Discharge   Problem: Health Behavior/Discharge Planning: Goal: Ability to manage health-related needs will improve 10/14/2020 1553 by Emmaline Life, RN Outcome: Adequate for Discharge 10/14/2020 1547 by Emmaline Life, RN Outcome: Adequate for Discharge   Problem: Clinical Measurements: Goal: Ability to maintain clinical measurements within normal limits will improve 10/14/2020 1553 by Emmaline Life, RN Outcome: Adequate for Discharge 10/14/2020 1547 by Emmaline Life, RN Outcome: Adequate for Discharge Goal: Will remain free from infection 10/14/2020 1553 by Emmaline Life, RN Outcome: Adequate for Discharge 10/14/2020 1547 by Emmaline Life, RN Outcome: Adequate for Discharge Goal: Diagnostic test results will improve 10/14/2020 1553 by Emmaline Life, RN Outcome: Adequate for Discharge 10/14/2020 1547 by Emmaline Life, RN Outcome: Adequate for Discharge Goal: Respiratory complications will improve 10/14/2020 1553 by Emmaline Life, RN Outcome: Adequate for Discharge 10/14/2020 1547 by Emmaline Life, RN Outcome: Adequate for Discharge Goal: Cardiovascular complication will be avoided 10/14/2020 1553 by Emmaline Life, RN Outcome: Adequate for Discharge 10/14/2020 1547 by Emmaline Life, RN Outcome: Adequate for Discharge   Problem: Activity: Goal: Risk for activity intolerance will decrease 10/14/2020 1553 by Emmaline Life, RN Outcome: Adequate for Discharge 10/14/2020 1547 by Emmaline Life, RN Outcome: Adequate for Discharge   Problem: Nutrition: Goal: Adequate nutrition will be maintained 10/14/2020 1553 by  Emmaline Life, RN Outcome: Adequate for Discharge 10/14/2020 1547 by Emmaline Life, RN Outcome: Adequate for Discharge   Problem: Coping: Goal: Level of anxiety will decrease 10/14/2020 1553 by Emmaline Life, RN Outcome: Adequate for Discharge 10/14/2020 1547 by Emmaline Life, RN Outcome: Adequate for Discharge   Problem: Elimination: Goal: Will not experience complications related to bowel motility 10/14/2020 1553 by Emmaline Life, RN Outcome: Adequate for Discharge 10/14/2020 1547 by Emmaline Life, RN Outcome: Adequate for Discharge Goal: Will not experience complications related to urinary retention 10/14/2020 1553 by Emmaline Life, RN Outcome: Adequate for Discharge 10/14/2020 1547 by Emmaline Life, RN Outcome: Adequate for Discharge   Problem: Pain Managment: Goal: General experience of comfort will improve 10/14/2020 1553 by Emmaline Life, RN Outcome: Adequate for Discharge 10/14/2020 1547 by Emmaline Life, RN Outcome: Adequate for Discharge   Problem: Safety: Goal: Ability to remain free from injury will improve 10/14/2020 1553 by Emmaline Life, RN Outcome: Adequate for Discharge 10/14/2020 1547 by Emmaline Life, RN Outcome: Adequate for Discharge   Problem: Skin Integrity: Goal: Risk for impaired skin integrity will decrease 10/14/2020 1553 by Emmaline Life, RN Outcome: Adequate for Discharge 10/14/2020 1547 by Emmaline Life, RN Outcome: Adequate for Discharge   Problem: Education: Goal: Knowledge of secondary prevention will improve 10/14/2020 1553 by Emmaline Life, RN Outcome: Adequate for Discharge 10/14/2020 1547 by Emmaline Life, RN Outcome: Adequate for Discharge Goal: Knowledge of patient specific risk factors addressed and post discharge goals established will improve 10/14/2020 1553 by Emmaline Life, RN Outcome: Adequate for Discharge 10/14/2020 1547 by Emmaline Life, RN Outcome: Adequate  for Discharge   Problem: Health Behavior/Discharge Planning: Goal: Ability to manage health-related needs will improve 10/14/2020 1553 by Emmaline Life, RN Outcome: Adequate for Discharge 10/14/2020 1547 by Emmaline Life, RN Outcome:  Adequate for Discharge

## 2020-10-14 NOTE — Plan of Care (Signed)
  Problem: Education: Goal: Knowledge of General Education information will improve Description: Including pain rating scale, medication(s)/side effects and non-pharmacologic comfort measures Outcome: Adequate for Discharge   Problem: Health Behavior/Discharge Planning: Goal: Ability to manage health-related needs will improve Outcome: Adequate for Discharge   Problem: Clinical Measurements: Goal: Ability to maintain clinical measurements within normal limits will improve Outcome: Adequate for Discharge Goal: Will remain free from infection Outcome: Adequate for Discharge Goal: Diagnostic test results will improve Outcome: Adequate for Discharge Goal: Respiratory complications will improve Outcome: Adequate for Discharge Goal: Cardiovascular complication will be avoided Outcome: Adequate for Discharge   Problem: Activity: Goal: Risk for activity intolerance will decrease Outcome: Adequate for Discharge   Problem: Nutrition: Goal: Adequate nutrition will be maintained Outcome: Adequate for Discharge   Problem: Coping: Goal: Level of anxiety will decrease Outcome: Adequate for Discharge   Problem: Elimination: Goal: Will not experience complications related to bowel motility Outcome: Adequate for Discharge Goal: Will not experience complications related to urinary retention Outcome: Adequate for Discharge   Problem: Pain Managment: Goal: General experience of comfort will improve Outcome: Adequate for Discharge   Problem: Safety: Goal: Ability to remain free from injury will improve Outcome: Adequate for Discharge   Problem: Skin Integrity: Goal: Risk for impaired skin integrity will decrease Outcome: Adequate for Discharge   Problem: Education: Goal: Knowledge of secondary prevention will improve Outcome: Adequate for Discharge Goal: Knowledge of patient specific risk factors addressed and post discharge goals established will improve Outcome: Adequate for  Discharge   Problem: Health Behavior/Discharge Planning: Goal: Ability to manage health-related needs will improve Outcome: Adequate for Discharge

## 2020-10-14 NOTE — Progress Notes (Signed)
Patient in bed with eyes closed. Unresponsive to touch, Resp even and unlabored. Patient is currently snoring. Awaiting [facility] arrival for transport to [place]. Patient appears comfortable and her depends diaper checked. Patient is dry.   1950-Called Hospice Home to be aware of patient's arrival via [facility] en route for her.  Izora Gala was nurse spoken to and she says to leave her 2 IVs in. Was updated on patient's current status.   2012- [facility] here to transport patient to [facility], AVS printed and given along with discharge paperwork and DNR documentation.

## 2020-10-14 NOTE — Progress Notes (Signed)
PROGRESS NOTE    Marie Burch  BZM:[id number] DOB: 1934-05-14 DOA: 10/10/2020 PCP: Lajean Manes, MD    Brief Narrative:  86 year old with history of Alzheimer's dementia with recently worsening symptoms.  History of subdural hematoma on Eliquis.  She had another fall at home, brought to ER and found to have acute on chronic left subdural hematoma with 5 mm midline shift.  In the emergency room patient became very agitated and confused.  Placed on restraints.  Other skeletal survey was negative. 12/5, given advanced dementia and now with agitation and subdural hematoma comfort care was started and hospice referral initiated.   Assessment & Plan:   Principal Problem:   Subdural hematoma (HCC) Active Problems:   S/P mitral valve repair   Coronary artery disease involving native coronary artery of native heart without angina pectoris   Essential hypertension   Paroxysmal atrial flutter (HCC)   Hx of CABG   Dementia with behavioral disturbance (HCC)   End of life care   DNR (do not resuscitate)   Malnutrition of moderate degree  Acute on chronic left-sided subdural hematoma, recurrent and untreatable Agitation and metabolic encephalopathy in a patient with underlying advanced dementia History of paroxysmal A. Fib Hypertension Chronic kidney disease stage III End-of-life care  Plan: Patient at end-of-life, inpatient hospice bed availability pending. She is more agitated and requiring restraints, will start patient on various opiates, benzodiazepines, haloperidol and symptom control medication and discontinue physical restraint.  If necessary, will start patient on morphine infusion. Comfort care.  Transfer to hospice when bed is available. RN can pronounce death if happens in the hospital. Unrestricted visitor access. Comfort diet. No benefit with continuing amiodarone and other oral rate control medication, discontinued.   DVT prophylaxis: SCDs Start: 10/10/20  2107   Code Status: Comfort care Family Communication: None Disposition Plan: Status is: Inpatient  Remains inpatient appropriate because:Unsafe d/c plan   Dispo: The patient is from: Home              Anticipated d/c is to: Residential hospice              Anticipated d/c date is: 1 day              Patient currently is medically stable to d/c.  Only to transfer to hospice level of care to provide end-of-life care.         Consultants:   Neurosurgery  Palliative  Procedures:   None  Antimicrobials:   None   Subjective: Patient seen and examined.  Currently she is sleepy and difficult to arouse.  She was reportedly more uncomfortable and agitated.  She was given a dose of morphine and also has restraints in place.  No family at the bedside.  Objective: Vitals:   10/13/20 2000 10/14/20 0400 10/14/20 0804 10/14/20 1126  BP: (!) 104/59 (!) 100/59 108/63 (!) 130/59  Pulse: 66 67 70 76  Resp: 20 20 16 17   Temp: 98.4 F (36.9 C) 98 F (36.7 C) 97.6 F (36.4 C) 97.8 F (36.6 C)  TempSrc: Oral Oral Oral Axillary  SpO2: 99%  97% 100%  Weight:      Height:        Intake/Output Summary (Last 24 hours) at 10/14/2020 1312 Last data filed at 10/14/2020 1242 Gross per 24 hour  Intake 190 ml  Output 100 ml  Net 90 ml   Filed Weights   10/10/20 2227 10/11/20 0424  Weight: 50.7 kg 51 kg  Examination:  General exam: Sick looking lady, occasionally irritated and agitation on stimulation. Resting after medications. On room air. Respiratory system: Clear to auscultation. Respiratory effort normal. Cardiovascular system: S1 & S2 heard, RRR. Agitated, restless, disoriented.    Data Reviewed: I have personally reviewed following labs and imaging studies  CBC: Recent Labs  Lab 10/10/20 1621 10/11/20 0339  WBC 7.5 8.3  HGB 14.8 14.2  HCT 46.9* 45.3  MCV 89.7 90.2  PLT 183 166*   Basic Metabolic Panel: Recent Labs  Lab 10/10/20 1621 10/11/20 0339  10/11/20 1409  NA 142 143 141  K 3.8 3.1* 3.5  CL 101 104 104  CO2 29 25 26   GLUCOSE 121* 172* 109*  BUN 14 9 6*  CREATININE 1.33* 1.01* 0.88  CALCIUM 9.3 9.0 9.0  MG  --  1.7  --    GFR: Estimated Creatinine Clearance: 37.6 mL/min (by C-G formula based on SCr of 0.88 mg/dL). Liver Function Tests: No results for input(s): AST, ALT, ALKPHOS, BILITOT, PROT, ALBUMIN in the last 168 hours. No results for input(s): LIPASE, AMYLASE in the last 168 hours. No results for input(s): AMMONIA in the last 168 hours. Coagulation Profile: No results for input(s): INR, PROTIME in the last 168 hours. Cardiac Enzymes: No results for input(s): CKTOTAL, CKMB, CKMBINDEX, TROPONINI in the last 168 hours. BNP (last 3 results) No results for input(s): PROBNP in the last 8760 hours. HbA1C: No results for input(s): HGBA1C in the last 72 hours. CBG: Recent Labs  Lab 10/11/20 0607 10/11/20 1243  GLUCAP 126* 110*   Lipid Profile: No results for input(s): CHOL, HDL, LDLCALC, TRIG, CHOLHDL, LDLDIRECT in the last 72 hours. Thyroid Function Tests: No results for input(s): TSH, T4TOTAL, FREET4, T3FREE, THYROIDAB in the last 72 hours. Anemia Panel: No results for input(s): VITAMINB12, FOLATE, FERRITIN, TIBC, IRON, RETICCTPCT in the last 72 hours. Sepsis Labs: No results for input(s): PROCALCITON, LATICACIDVEN in the last 168 hours.  Recent Results (from the past 240 hour(s))  Resp Panel by RT-PCR (Flu A&B, Covid) Nasopharyngeal Swab     Status: None   Collection Time: 10/11/20 12:44 AM   Specimen: Nasopharyngeal Swab; Nasopharyngeal(NP) swabs in vial transport medium  Result Value Ref Range Status   SARS Coronavirus 2 by RT PCR NEGATIVE NEGATIVE Final    Comment: (NOTE) SARS-CoV-2 target nucleic acids are NOT DETECTED.  The SARS-CoV-2 RNA is generally detectable in upper respiratory specimens during the acute phase of infection. The lowest concentration of SARS-CoV-2 viral copies this assay can  detect is 138 copies/mL. A negative result does not preclude SARS-Cov-2 infection and should not be used as the sole basis for treatment or other patient management decisions. A negative result may occur with  improper specimen collection/handling, submission of specimen other than nasopharyngeal swab, presence of viral mutation(s) within the areas targeted by this assay, and inadequate number of viral copies(<138 copies/mL). A negative result must be combined with clinical observations, patient history, and epidemiological information. The expected result is Negative.  Fact Sheet for Patients:  EntrepreneurPulse.com.au  Fact Sheet for Healthcare Providers:  IncredibleEmployment.be  This test is no t yet approved or cleared by the [place] FDA and  has been authorized for detection and/or diagnosis of SARS-CoV-2 by FDA under an Emergency Use Authorization (EUA). This EUA will remain  in effect (meaning this test can be used) for the duration of the COVID-19 declaration under Section 564(b)(1) of the Act, 21 U.S.C.section 360bbb-3(b)(1), unless the authorization is terminated  or  revoked sooner.       Influenza A by PCR NEGATIVE NEGATIVE Final   Influenza B by PCR NEGATIVE NEGATIVE Final    Comment: (NOTE) The Xpert Xpress SARS-CoV-2/FLU/RSV plus assay is intended as an aid in the diagnosis of influenza from Nasopharyngeal swab specimens and should not be used as a sole basis for treatment. Nasal washings and aspirates are unacceptable for Xpert Xpress SARS-CoV-2/FLU/RSV testing.  Fact Sheet for Patients: EntrepreneurPulse.com.au  Fact Sheet for Healthcare Providers: IncredibleEmployment.be  This test is not yet approved or cleared by the [place] FDA and has been authorized for detection and/or diagnosis of SARS-CoV-2 by FDA under an Emergency Use Authorization (EUA). This EUA will remain in  effect (meaning this test can be used) for the duration of the COVID-19 declaration under Section 564(b)(1) of the Act, 21 U.S.C. section 360bbb-3(b)(1), unless the authorization is terminated or revoked.  Performed at [facility], [place] [address] [place], [place] [address]   MRSA PCR Screening     Status: None   Collection Time: 10/11/20  6:02 AM   Specimen: Nasal Mucosa; Nasopharyngeal  Result Value Ref Range Status   MRSA by PCR NEGATIVE NEGATIVE Final    Comment:        The GeneXpert MRSA Assay (FDA approved for NASAL specimens only), is one component of a comprehensive MRSA colonization surveillance program. It is not intended to diagnose MRSA infection nor to guide or monitor treatment for MRSA infections. Performed at [facility], [place] [address], [place], [place] [address]          Radiology Studies: No results found.      Scheduled Meds:  acetaminophen  1,000 mg Oral Q6H   clonazePAM  0.5 mg Oral QHS   Continuous Infusions:   LOS: 2 days    Time spent: 35 minutes    Barb Merino, MD [facility] Pager [phone]

## 2020-10-18 ENCOUNTER — Ambulatory Visit: Payer: PPO | Admitting: Interventional Cardiology

## 2020-11-06 IMAGING — CT CT CERVICAL SPINE W/O CM
3 series · 13 of 20 positions shown, 15 images · non-contrast
Comparison: Cervical spine CT dated 08/16/2020.

CLINICAL DATA: 86-year-old female with neck trauma.

EXAM:
CT HEAD WITHOUT CONTRAST
CT CERVICAL SPINE WITHOUT CONTRAST
TECHNIQUE: Multidetector CT imaging of the head and cervical spine was
performed following the standard protocol without intravenous
contrast. Multiplanar CT image reconstructions of the cervical spine
were also generated.

[Series 3: c spine bone · axial · 0.41mm/px · z∈[-233,-109]mm · 5 of 94 slices shown, 7 images]
[im 16/94  soft-tissue]
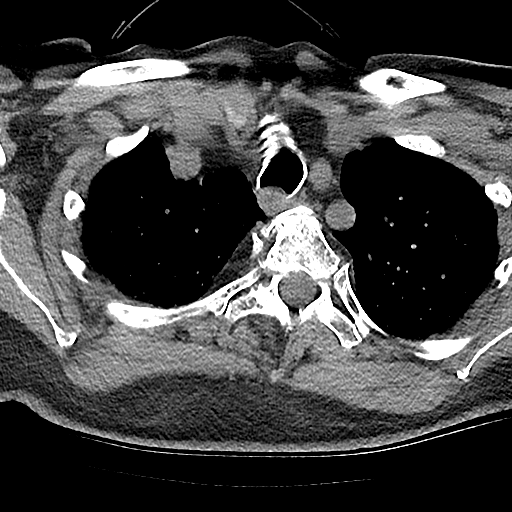
[im 16/94  bone]
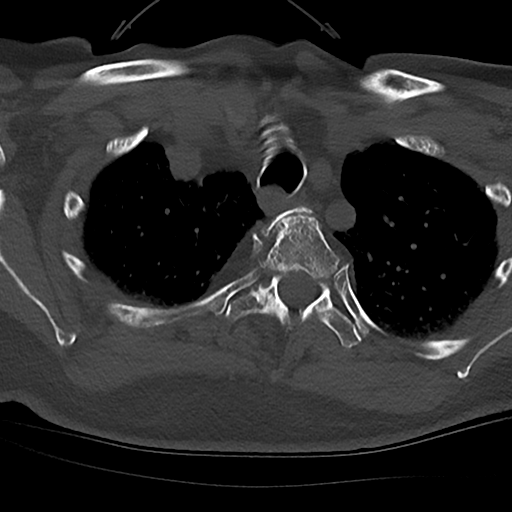
[im 32/94  bone]
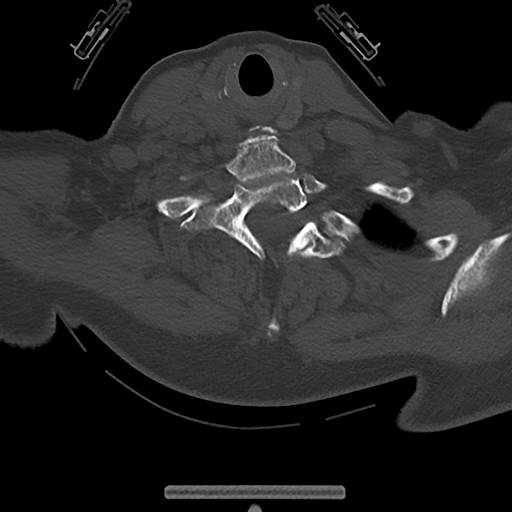
[im 47/94  bone]
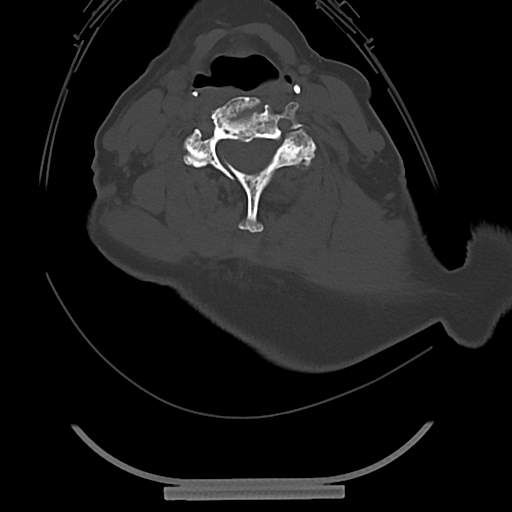
[im 63/94  bone]
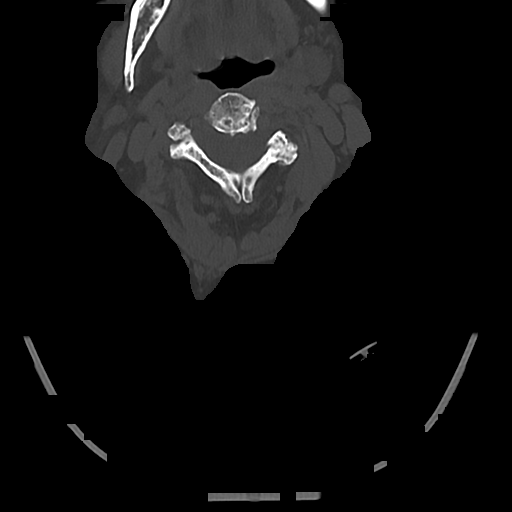
[im 78/94  soft-tissue]
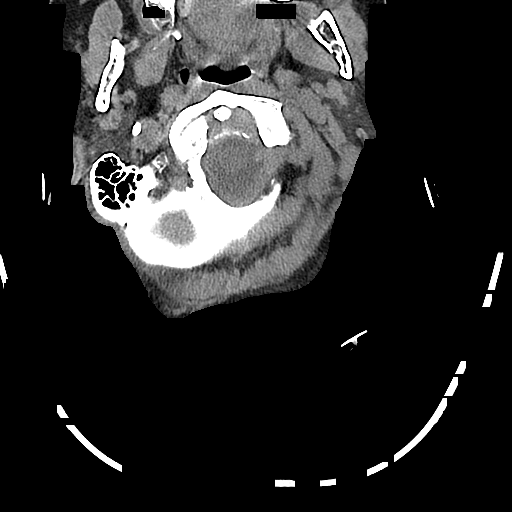
[im 78/94  bone]
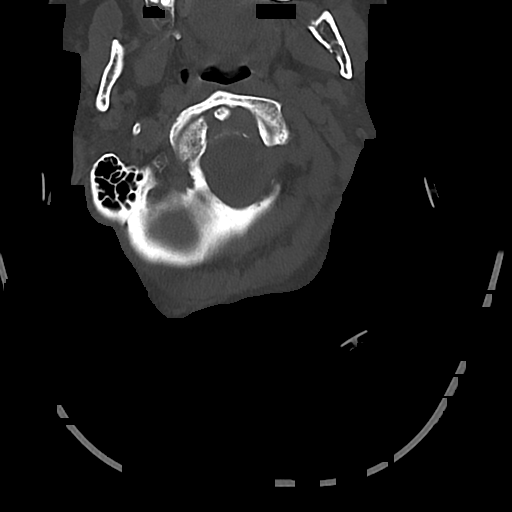

[Series 9: cor bone · coronal · 0.35mm/px · 3 of 93 slices shown]
[im 19/93  bone]
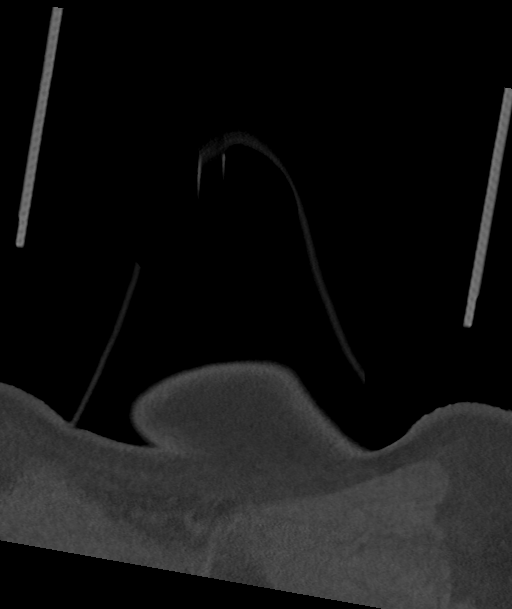
[im 37/93  bone]
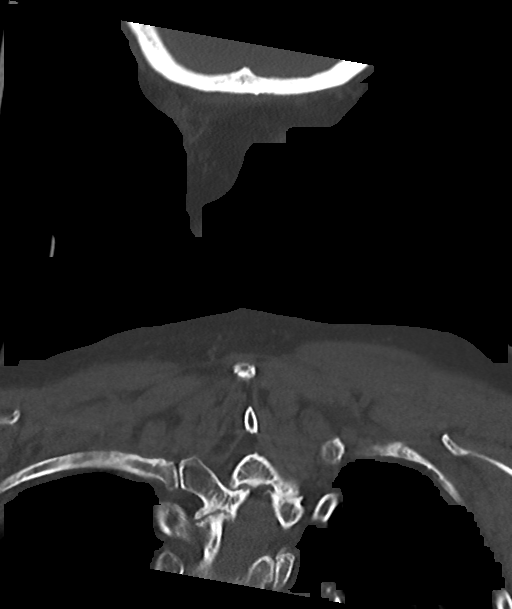
[im 56/93  bone]
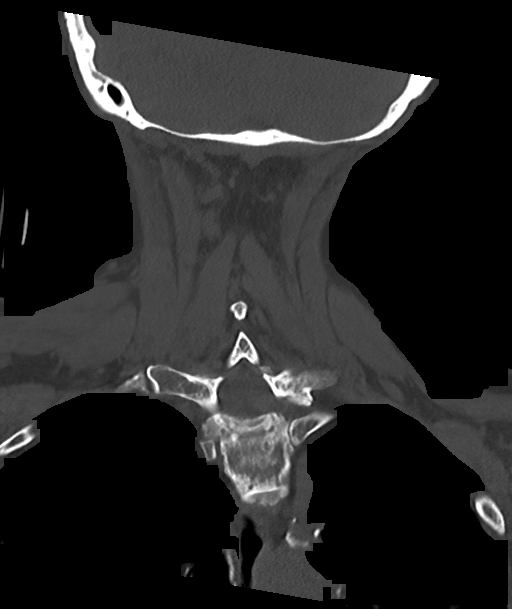

[Series 10: orthogonal axials · axial · 0.21mm/px · z∈[-258,-134]mm · 5 of 105 slices shown]
[im 18/105  bone]
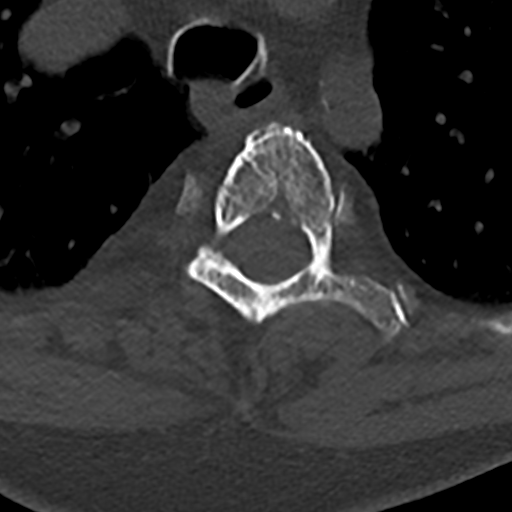
[im 35/105  bone]
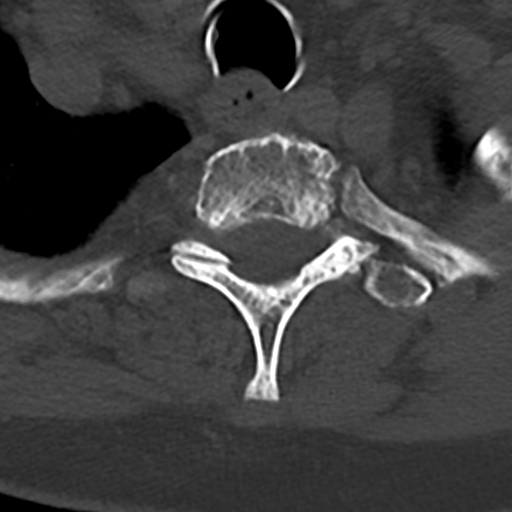
[im 53/105  bone]
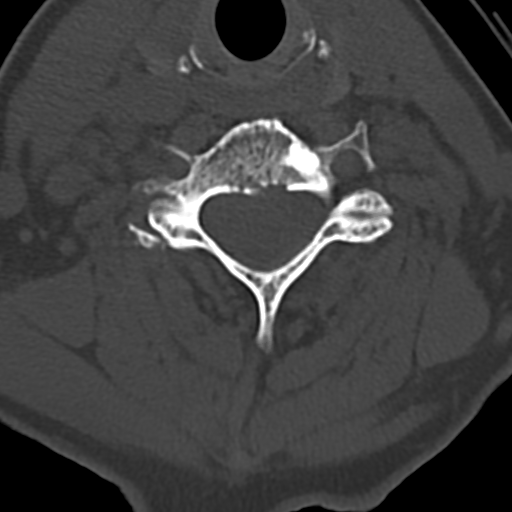
[im 70/105  bone]
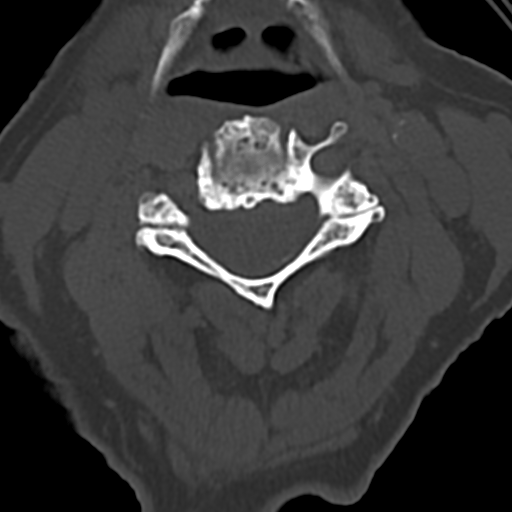
[im 87/105  bone]
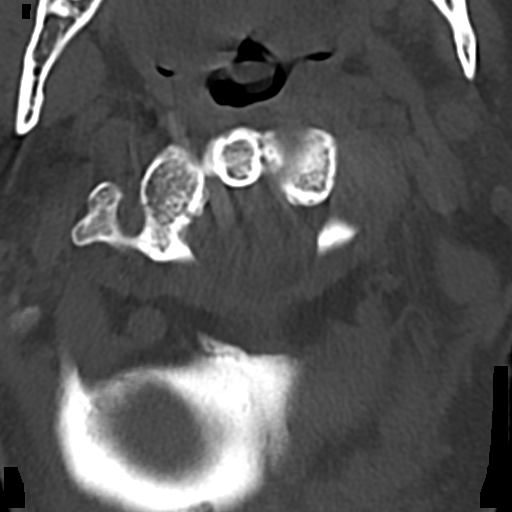

[13 of 20 positions shown; findings below may reference images not displayed]

FINDINGS: CT HEAD FINDINGS

Brain: Large bilateral hemispheric low attenuating subdural
collections, likely subacute or chronic, but new since the prior CT
of 02/17/2020 measuring up to 1 cm in thickness. There is high
attenuating subdural blood over the left temporal and parietal lobes
measuring approximately 9 mm in thickness consistent with acute
bleed. There is associated mass effect on the brain parenchyma by
the bilateral subdural collections. No midline shift.

Vascular: No hyperdense vessel or unexpected calcification.

Skull: Normal. Negative for fracture or focal lesion.

Sinuses/Orbits: No acute finding.

Other: None

CT CERVICAL SPINE FINDINGS

Alignment: No acute subluxation.

Skull base and vertebrae: No acute fracture. Osteopenia. Probable
small bone island in C6.

Soft tissues and spinal canal: No prevertebral fluid or swelling. No
visible canal hematoma.

Disc levels: Multilevel degenerative changes with endplate
irregularity and disc space narrowing.

Upper chest: Negative.

Other: Subcentimeter left thyroid hypodense nodule Not clinically
significant; no follow-up imaging recommended (ref: [HOSPITAL]. [DATE]): 143-50).
IMPRESSION: 1. Acute subdural hemorrhage over the left frontal and temporal lobe
on subacute or chronic bilateral hemispheric subdural hemorrhages.
Moderate mass effect on the brain parenchyma. No midline shift.
2. No acute/traumatic cervical spine pathology.

These results were called by telephone at the time of interpretation
on 08/27/2020 at [DATE] to provider PAULUS N CEEJAY , who verbally
acknowledged these results.

## 2020-11-09 DEATH — deceased

## 2021-01-03 ENCOUNTER — Ambulatory Visit: Payer: PPO | Admitting: Interventional Cardiology
